# Patient Record
Sex: Male | Born: 1940 | Race: White | Hispanic: No | Marital: Married
Health system: Midwestern US, Community
[De-identification: ages and names within clinical notes are randomized; demographics above are authoritative.]

## PROBLEM LIST (undated history)

## (undated) DIAGNOSIS — M109 Gout, unspecified: Secondary | ICD-10-CM

## (undated) DIAGNOSIS — E119 Type 2 diabetes mellitus without complications: Secondary | ICD-10-CM

## (undated) DIAGNOSIS — F319 Bipolar disorder, unspecified: Secondary | ICD-10-CM

---

## 2018-09-15 ENCOUNTER — Emergency Department
Admission: EM | Admit: 2018-09-15 | Discharge: 2018-09-18 | Disposition: A | Discharge status: Other Acute Inpatient Hospital | Payer: Self-pay | Attending: Emergency Medicine | Admitting: Emergency Medicine

## 2018-09-15 ENCOUNTER — Encounter: Payer: Self-pay | Admitting: Emergency Medicine

## 2018-09-15 ENCOUNTER — Other Ambulatory Visit: Payer: Self-pay

## 2018-09-15 ENCOUNTER — Emergency Department: Payer: Self-pay

## 2018-09-15 DIAGNOSIS — F3110 Bipolar disorder, current episode manic without psychotic features, unspecified: Secondary | ICD-10-CM

## 2018-09-15 DIAGNOSIS — Z046 Encounter for general psychiatric examination, requested by authority: Secondary | ICD-10-CM | POA: Insufficient documentation

## 2018-09-15 DIAGNOSIS — F319 Bipolar disorder, unspecified: Secondary | ICD-10-CM | POA: Insufficient documentation

## 2018-09-15 DIAGNOSIS — M109 Gout, unspecified: Secondary | ICD-10-CM

## 2018-09-15 DIAGNOSIS — E119 Type 2 diabetes mellitus without complications: Secondary | ICD-10-CM | POA: Insufficient documentation

## 2018-09-15 DIAGNOSIS — Z79899 Other long term (current) drug therapy: Secondary | ICD-10-CM | POA: Insufficient documentation

## 2018-09-15 DIAGNOSIS — F312 Bipolar disorder, current episode manic severe with psychotic features: Secondary | ICD-10-CM

## 2018-09-15 HISTORY — DX: Bipolar disorder, unspecified: F31.9

## 2018-09-15 HISTORY — DX: Type 2 diabetes mellitus without complications: E11.9

## 2018-09-15 HISTORY — DX: Gout, unspecified: M10.9

## 2018-09-15 LAB — COMPREHENSIVE METABOLIC PANEL
ALT: 34 U/L (ref 0–44)
AST: 39 U/L (ref 15–41)
Albumin: 3.9 g/dL (ref 3.5–5.0)
Alkaline Phosphatase: 98 U/L (ref 38–126)
Anion gap: 11 (ref 5–15)
BUN: 15 mg/dL (ref 8–23)
CO2: 25 mmol/L (ref 22–32)
Calcium: 9.6 mg/dL (ref 8.9–10.3)
Chloride: 100 mmol/L (ref 98–111)
Creatinine, Ser: 1.19 mg/dL (ref 0.61–1.24)
GFR calc non Af Amer: 59 mL/min — ABNORMAL LOW (ref >60)
Glucose, Bld: 190 mg/dL — ABNORMAL HIGH (ref 70–99)
Potassium: 3.2 mmol/L — ABNORMAL LOW (ref 3.5–5.1)
Sodium: 136 mmol/L (ref 135–145)
TOTAL PROTEIN: 8.1 g/dL (ref 6.5–8.1)
Total Bilirubin: 0.7 mg/dL (ref 0.3–1.2)

## 2018-09-15 LAB — SALICYLATE LEVEL

## 2018-09-15 LAB — CBC
HCT: 42.8 % (ref 39.0–52.0)
Hemoglobin: 14.4 g/dL (ref 13.0–17.0)
MCH: 30.2 pg (ref 26.0–34.0)
MCHC: 33.6 g/dL (ref 30.0–36.0)
MCV: 89.7 fL (ref 80.0–100.0)
Platelets: 272 10*3/uL (ref 150–400)
RBC: 4.77 MIL/uL (ref 4.22–5.81)
RDW: 12.2 % (ref 11.5–15.5)
WBC: 7.1 10*3/uL (ref 4.0–10.5)
nRBC: 0 % (ref 0.0–0.2)

## 2018-09-15 LAB — URINALYSIS, COMPLETE (UACMP) WITH MICROSCOPIC
Bilirubin Urine: NEGATIVE
Glucose, UA: 150 mg/dL — AB
Hgb urine dipstick: NEGATIVE
KETONES UR: NEGATIVE mg/dL
Leukocytes, UA: NEGATIVE
Nitrite: NEGATIVE
Protein, ur: NEGATIVE mg/dL
Specific Gravity, Urine: 1.005 (ref 1.005–1.030)
Squamous Epithelial / HPF: NONE SEEN (ref 0–5)
pH: 6 (ref 5.0–8.0)

## 2018-09-15 LAB — URINE DRUG SCREEN, QUALITATIVE (ARMC ONLY)
Amphetamines, Ur Screen: NOT DETECTED
Barbiturates, Ur Screen: NOT DETECTED
Benzodiazepine, Ur Scrn: NOT DETECTED
Cannabinoid 50 Ng, Ur ~~LOC~~: NOT DETECTED
Cocaine Metabolite,Ur ~~LOC~~: NOT DETECTED
MDMA (Ecstasy)Ur Screen: NOT DETECTED
Methadone Scn, Ur: NOT DETECTED
Opiate, Ur Screen: NOT DETECTED
Phencyclidine (PCP) Ur S: NOT DETECTED
Tricyclic, Ur Screen: NOT DETECTED

## 2018-09-15 LAB — ACETAMINOPHEN LEVEL: Acetaminophen (Tylenol), Serum: <10 ug/mL — ABNORMAL LOW (ref 10–30)

## 2018-09-15 LAB — ETHANOL

## 2018-09-15 MED ORDER — METFORMIN HCL 500 MG PO TABS
500.0000 mg | ORAL_TABLET | Freq: Two times a day (BID) | ORAL | Status: DC
  Administered 2018-09-16 – 2018-09-18 (×4): 500 mg via ORAL
  Filled 2018-09-15 (×5): qty 1

## 2018-09-15 MED ORDER — INDOMETHACIN ER 75 MG PO CPCR
75.0000 mg | ORAL_CAPSULE | ORAL | Status: AC
  Administered 2018-09-15: 75 mg via ORAL
  Filled 2018-09-15: qty 1

## 2018-09-15 MED ORDER — INSULIN ASPART 100 UNIT/ML ~~LOC~~ SOLN
0.0000 [IU] | Freq: Three times a day (TID) | SUBCUTANEOUS | Status: DC
  Administered 2018-09-16: 3 [IU] via SUBCUTANEOUS
  Filled 2018-09-15: qty 1

## 2018-09-15 MED ORDER — NICOTINE 21 MG/24HR TD PT24
21.0000 mg | MEDICATED_PATCH | Freq: Every day | TRANSDERMAL | Status: DC
  Filled 2018-09-15: qty 1

## 2018-09-15 MED ORDER — ATORVASTATIN CALCIUM 20 MG PO TABS
20.0000 mg | ORAL_TABLET | Freq: Every day | ORAL | Status: DC
  Administered 2018-09-15: 20 mg via ORAL
  Filled 2018-09-15: qty 1

## 2018-09-15 MED ORDER — LOSARTAN POTASSIUM 50 MG PO TABS
25.0000 mg | ORAL_TABLET | Freq: Every day | ORAL | Status: DC
Start: 2018-09-15 — End: 2018-09-18
  Administered 2018-09-15 – 2018-09-17 (×3): 25 mg via ORAL
  Filled 2018-09-15 (×3): qty 1

## 2018-09-15 MED ORDER — COLCHICINE 0.6 MG PO TABS
0.6000 mg | ORAL_TABLET | ORAL | Status: AC
  Administered 2018-09-15: 0.6 mg via ORAL
  Filled 2018-09-15: qty 1

## 2018-09-15 MED ORDER — DIVALPROEX SODIUM 250 MG PO DR TAB
250.0000 mg | DELAYED_RELEASE_TABLET | Freq: Two times a day (BID) | ORAL | Status: DC
  Administered 2018-09-15 – 2018-09-17 (×4): 250 mg via ORAL
  Filled 2018-09-15 (×5): qty 1

## 2018-09-15 MED ORDER — AMLODIPINE BESYLATE 5 MG PO TABS
5.0000 mg | ORAL_TABLET | Freq: Every day | ORAL | Status: DC
Start: 2018-09-15 — End: 2018-09-18
  Administered 2018-09-15 – 2018-09-17 (×3): 5 mg via ORAL
  Filled 2018-09-15 (×3): qty 1

## 2018-09-15 MED ORDER — ASPIRIN EC 81 MG PO TBEC
81.0000 mg | DELAYED_RELEASE_TABLET | Freq: Every day | ORAL | Status: DC
  Administered 2018-09-15 – 2018-09-17 (×3): 81 mg via ORAL
  Filled 2018-09-15 (×3): qty 1

## 2018-09-15 MED ORDER — OLANZAPINE 5 MG PO TABS
5.0000 mg | ORAL_TABLET | Freq: Every day | ORAL | Status: DC
  Administered 2018-09-15 – 2018-09-16 (×2): 5 mg via ORAL
  Filled 2018-09-15 (×4): qty 1

## 2018-09-15 NOTE — Consult Note (Signed)
[facility] Face-to-Face Psychiatry Consult   Reason for Consult: Consult for this 77 year old man brought here under involuntary commitment papers from [facility] Referring Physician: Siadecki Patient Identification: Luis Cooper MRN:  [id number] Principal Diagnosis: Bipolar affective disorder, current episode manic (HCC) Diagnosis:  Principal Problem:   Bipolar affective disorder, current episode manic (HCC) Active Problems:   Diabetes mellitus without complication (HCC)   Gout   Total Time spent with patient: 1 hour  Subjective:   Luis Cooper is a 77 y.o. male patient admitted with "it is because of my damn wife".  HPI: Patient seen chart reviewed.  Patient was sent here from [facility] on involuntary commitment papers initiated by his wife.  Paperwork alleges that the patient has bipolar disorder that he has been agitated and angry threatening disorganized in his thinking noncompliant with medical treatment.  Apparently the wife called 911 a couple days ago because she felt that the patient was getting out of control but the ENTs did not feel that he needed to be taken to the hospital.  Today the wife went ahead and took out commitment papers.  Patient was evaluated at [facility] and they also felt he was having a manic episode.  I have limited information at this point.  The patient tells me that he has been admitted to [facility] in the past but we do not have access to any [facility] records.  He knows that he is supposed to be on medicine but we do not have access to any of his pharmacy records yet either.  He does tell me that he has not been taking any of his medicines for days because he "does not need it".  He tells me he has not slept more than 2 or 3 hours in the last week.  Has not been eating well.  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  Pretty disorganized.  He curses when talking about his wife and  says that he has been feeling like he would lose his temper but does not make any specific threats.  Denies suicidal ideation.  Patient says that he does not drink alcohol currently having quit 2 weeks ago denies any other drug use.  He is complaining quite a bit about pain in his ankle from his suppose it gout.  Social history: Apparently lives in [place] with his wife.  He tells me a daughter lives at home as well.  Medical history: The commitment paperwork says that he has diabetes and gout.  He gives me a list of some medicines that include atorvastatin and amlodipine and losartan suggesting that he also has high blood pressure and cholesterol.  Patient says he is having acute pain in his ankle from his gout.  He does not know what his gout medicine is.  Substance abuse history: He says he quit drinking 2 weeks ago.  Denies having had abuse problems before that.  Past Psychiatric History: Limited information because we do not have access to many records.  Patient tells me he has had psychiatric hospitalizations in the past.  Cannot remember how many.  Last one was at [facility] in 2018.  Medicines according to the paperwork are supposed to be Depakote and Zyprexa doses unclear.  Patient denies any history of suicide attempts admits to being aggressive in the past  Risk to Self:   Risk to  Others:   Prior Inpatient Therapy:   Prior Outpatient Therapy:    Past Medical History:  Past Medical History:  Diagnosis Date  . Bipolar 1 disorder (HCC)   . Diabetes mellitus without complication (HCC)   . Gout    History reviewed. No pertinent surgical history. Family History: No family history on file. Family Psychiatric  History: He says everyone in his family has mental health problems but it is hard to tell if he is joking or not.  He cannot come up with any specifics. Social History:  Social History   Substance and Sexual Activity  Alcohol Use Not on file     Social History    Substance and Sexual Activity  Drug Use Not on file    Social History   Socioeconomic History  . Marital status: Not on file    Spouse name: Not on file  . Number of children: Not on file  . Years of education: Not on file  . Highest education level: Not on file  Occupational History  . Not on file  Social Needs  . Financial resource strain: Not on file  . Food insecurity:    Worry: Not on file    Inability: Not on file  . Transportation needs:    Medical: Not on file    Non-medical: Not on file  Tobacco Use  . Smoking status: Unknown If Ever Smoked  Substance and Sexual Activity  . Alcohol use: Not on file  . Drug use: Not on file  . Sexual activity: Not on file  Lifestyle  . Physical activity:    Days per week: Not on file    Minutes per session: Not on file  . Stress: Not on file  Relationships  . Social connections:    Talks on phone: Not on file    Gets together: Not on file    Attends religious service: Not on file    Active member of club or organization: Not on file    Attends meetings of clubs or organizations: Not on file    Relationship status: Not on file  Other Topics Concern  . Not on file  Social History Narrative  . Not on file   Additional Social History:    Allergies:  Allergies not on file  Labs:  Results for orders placed or performed during the hospital encounter of 09/15/18 (from the past 48 hour(s))  Comprehensive metabolic panel     Status: Abnormal   Collection Time: 09/15/18  3:54 PM  Result Value Ref Range   Sodium 136 135 - 145 mmol/L   Potassium 3.2 (L) 3.5 - 5.1 mmol/L   Chloride 100 98 - 111 mmol/L   CO2 25 22 - 32 mmol/L   Glucose, Bld 190 (H) 70 - 99 mg/dL   BUN 15 8 - 23 mg/dL   Creatinine, Ser 1.191.19 0.61 - 1.24 mg/dL   Calcium 9.6 8.9 - 14.710.3 mg/dL   Total Protein 8.1 6.5 - 8.1 g/dL   Albumin 3.9 3.5 - 5.0 g/dL   AST 39 15 - 41 U/L   ALT 34 0 - 44 U/L   Alkaline Phosphatase 98 38 - 126 U/L   Total Bilirubin 0.7  0.3 - 1.2 mg/dL   GFR calc non Af Amer 59 (L) >60 mL/min   GFR calc Af Amer >60 >60 mL/min   Anion gap 11 5 - 15    Comment: Performed at [facility], [address] [place], [place]  [address]  Ethanol     Status: None   Collection Time: 09/15/18  3:54 PM  Result Value Ref Range   Alcohol, Ethyl (B) <10 <10 mg/dL    Comment: (NOTE) Lowest detectable limit for serum alcohol is 10 mg/dL. For medical purposes only. Performed at [facility], [address] [place], [place] [address]   Salicylate level     Status: None   Collection Time: 09/15/18  3:54 PM  Result Value Ref Range   Salicylate Lvl <7.0 2.8 - 30.0 mg/dL    Comment: Performed at [facility], [address] [place], [place] [address]  Acetaminophen level     Status: Abnormal   Collection Time: 09/15/18  3:54 PM  Result Value Ref Range   Acetaminophen (Tylenol), Serum <10 (L) 10 - 30 ug/mL    Comment: (NOTE) Therapeutic concentrations vary significantly. A range of 10-30 ug/mL  may be an effective concentration for many patients. However, some  are best treated at concentrations outside of this range. Acetaminophen concentrations >150 ug/mL at 4 hours after ingestion  and >50 ug/mL at 12 hours after ingestion are often associated with  toxic reactions. Performed at [facility], [address] [place], [place] [address]   cbc     Status: None   Collection Time: 09/15/18  3:54 PM  Result Value Ref Range   WBC 7.1 4.0 - 10.5 K/uL   RBC 4.77 4.22 - 5.81 MIL/uL   Hemoglobin 14.4 13.0 - 17.0 g/dL   HCT 62.9 52.8 - 41.3 %   MCV 89.7 80.0 - 100.0 fL   MCH 30.2 26.0 - 34.0 pg   MCHC 33.6 30.0 - 36.0 g/dL   RDW 24.4 01.0 - 27.2 %   Platelets 272 150 - 400 K/uL   nRBC 0.0 0.0 - 0.2 %    Comment: Performed at [facility], [address] [place], [place] [address]    Current Facility-Administered Medications  Medication Dose Route Frequency Provider Last Rate Last  Dose  . colchicine tablet 0.6 mg  0.6 mg Oral NOW Dereon Williamsen T, MD      . indomethacin (INDOCIN SR) capsule 75 mg  75 mg Oral NOW Lessie Funderburke, Jackquline Denmark, MD       No current outpatient medications on file.    Musculoskeletal: Strength & Muscle Tone: within normal limits Gait & Station: unsteady Patient leans: N/A  Psychiatric Specialty Exam: Physical Exam  Nursing note and vitals reviewed. Constitutional: He appears well-developed and well-nourished.  HENT:  Head: Normocephalic and atraumatic.  Eyes: Pupils are equal, round, and reactive to light. Conjunctivae are normal.  Neck: Normal range of motion.  Cardiovascular: Regular rhythm and normal heart sounds.  Respiratory: Effort normal. No respiratory distress.  GI: Soft.  Musculoskeletal: Normal range of motion.  Neurological: He is alert.  Skin: Skin is warm and dry.  Psychiatric: His affect is labile. His speech is tangential. He is agitated. He is not aggressive. Thought content is paranoid. Cognition and memory are impaired. He expresses inappropriate judgment. He expresses no homicidal and no suicidal ideation.    Review of Systems  Constitutional: Negative.   HENT: Negative.   Eyes: Negative.   Respiratory: Negative.   Cardiovascular: Negative.   Gastrointestinal: Negative.   Musculoskeletal: Positive for joint pain.  Skin: Negative.   Neurological: Negative.   Psychiatric/Behavioral: Positive for memory loss. Negative for depression, hallucinations, substance abuse and suicidal ideas. The patient has insomnia. The patient is not nervous/anxious.  There were no vitals taken for this visit.There is no height or weight on file to calculate BMI.  General Appearance: Disheveled  Eye Contact:  Good  Speech:  Pressured  Volume:  Increased  Mood:  Angry, Anxious and Irritable  Affect:  Labile  Thought Process:  Disorganized  Orientation:  Full (Time, Place, and Person)  Thought Content:  Illogical, Delusions and  Paranoid Ideation  Suicidal Thoughts:  No  Homicidal Thoughts:  No  Memory:  Immediate;   Fair Recent;   Fair Remote;   Fair  Judgement:  Impaired  Insight:  Shallow  Psychomotor Activity:  Normal  Concentration:  Concentration: Fair  Recall:  [employer] of Knowledge:  Fair  Language:  Fair  Akathisia:  No  Handed:  Right  AIMS (if indicated):     Assets:  Housing Social Support  ADL's:  Impaired  Cognition:  Impaired,  Mild  Sleep:        Treatment Plan Summary: Daily contact with patient to assess and evaluate symptoms and progress in treatment, Medication management and Plan 77 year old man who is described by referral sources as having mania.  On presentation today it is consistent with that.  He is hyperverbal disorganized in his thinking grandiose.  A little agitated but has not been violent or threatening to me although it is apparent that he was cursing when he first came into the emergency room.  Probably has been noncompliant with medicine.  Further details unclear.  Patient has multiple medical problems but so far his labs are pretty unremarkable.  I am going to order one-time doses of indomethacin and colchicine for his gout.  I will order his Depakote and Zyprexa at what seem like reasonable doses.  We will have to keep an eye on his blood sugar.  I am not clear if he takes insulin or just uses oral medicine.  I would call his wife for information but we do not have that information in the chart yet.  Overall I think he needs hospitalization and I would recommend geriatric psychiatry as being the first place to look.  Case reviewed with TTS and emergency room doctor.  Disposition: Recommend psychiatric Inpatient admission when medically cleared. Supportive therapy provided about ongoing stressors.  Mordecai Rasmussen, MD 09/15/2018 5:36 PM

## 2018-09-15 NOTE — ED Notes (Signed)
Patient assigned to appropriate care area   Introduced self to pt  Patient oriented to unit/care area: Informed that, for their safety, care areas are designed for safety and visiting and phone hours explained to patient. Patient verbalizes understanding, and verbal contract for safety obtained Environment secured  Pt talking loudly   PT with BPD , pt has been IVC by family due to bipolar and not taking meds or going to appointments and + AVH. PT very ill speaking in triage to this RN , pt appears very angry. Pt refuses to answers questions .

## 2018-09-15 NOTE — ED Provider Notes (Signed)
[facility] Emergency Department Provider Note ____________________________________________   First MD Initiated Contact with Patient 09/15/18 1630     (approximate)  I have reviewed the triage vital signs and the nursing notes.   HISTORY  Chief Complaint No chief complaint on file.    HPI Luis Cooper is a 77 y.o. male with PMH as noted below who presents for psychiatric evaluation after he was placed under involuntary commitment.  Per the commitment paperwork, the patient has been noncompliant with medications and appointments, agitated, and speaking about "something big that is going to happen."  At this time the patient denies any acute complaints except for pain in bilateral feet related to his gout.  He denies SI or HI.   Past Medical History:  Diagnosis Date  . Bipolar 1 disorder (HCC)   . Diabetes mellitus without complication (HCC)   . Gout     Patient Active Problem List   Diagnosis Date Noted  . Bipolar affective disorder, current episode manic (HCC) 09/15/2018  . Diabetes mellitus without complication (HCC) 09/15/2018  . Gout 09/15/2018    History reviewed. No pertinent surgical history.  Prior to Admission medications   Medication Sig Start Date End Date Taking? Authorizing Provider  atorvastatin (LIPITOR) 40 MG tablet TAKE 1 TABLET BY MOUTH ONCE DAILY FOR CHOLESTEROL 06/18/18  Yes [provider]  divalproex (DEPAKOTE) 500 MG DR tablet TAKE 1 TABLET BY MOUTH TWICE DAILY (1 IN THE MORNING AND 1 AT BEDTIME) 08/29/18  Yes [provider]    Allergies Patient has no allergy information on record.  No family history on file.  Social History Social History   Tobacco Use  . Smoking status: Unknown If Ever Smoked  Substance Use Topics  . Alcohol use: Not on file  . Drug use: Not on file    Review of Systems  Constitutional: No fever. Eyes: No visual changes. ENT: No sore throat. Cardiovascular: Denies  chest pain. Respiratory: Denies shortness of breath. Gastrointestinal: No vomiting or diarrhea.  Genitourinary: Negative for flank pain.  Musculoskeletal: Positive for foot pain. Skin: Negative for rash. Neurological: Negative for headache.   ____________________________________________   PHYSICAL EXAM:  VITAL SIGNS: ED Triage Vitals  Enc Vitals Group     BP      Pulse      Resp      Temp      Temp src      SpO2      Weight      Height      Head Circumference      Peak Flow      Pain Score      Pain Loc      Pain Edu?      Excl. in GC?     Constitutional: Alert and oriented.  Relatively well appearing and in no acute distress. Eyes: Conjunctivae are normal.  Head: Atraumatic. Nose: No congestion/rhinnorhea. Mouth/Throat: Mucous membranes are moist.   Neck: Normal range of motion.  Cardiovascular: Good peripheral circulation. Respiratory: Normal respiratory effort. Gastrointestinal: No distention.  Musculoskeletal:  Extremities warm and well perfused.  Neurologic:  Normal speech and language. No gross focal neurologic deficits are appreciated.  Skin:  Skin is warm and dry. No rash noted. Psychiatric: Agitated and somewhat labile.  ____________________________________________   LABS (all labs ordered are listed, but only abnormal results are displayed)  Labs Reviewed  COMPREHENSIVE METABOLIC PANEL - Abnormal; Notable for the following components:      Result  Value   Potassium 3.2 (*)    Glucose, Bld 190 (*)    GFR calc non Af Amer 59 (*)    All other components within normal limits  ACETAMINOPHEN LEVEL - Abnormal; Notable for the following components:   Acetaminophen (Tylenol), Serum <10 (*)    All other components within normal limits  URINALYSIS, COMPLETE (UACMP) WITH MICROSCOPIC - Abnormal; Notable for the following components:   Color, Urine STRAW (*)    APPearance CLEAR (*)    Glucose, UA 150 (*)    Bacteria, UA RARE (*)    All other components  within normal limits  ETHANOL  SALICYLATE LEVEL  CBC  URINE DRUG SCREEN, QUALITATIVE ([facility] ONLY)  HEMOGLOBIN A1C   ____________________________________________  EKG   ____________________________________________  RADIOLOGY  CXR: Patient declines  ____________________________________________   PROCEDURES  Procedure(s) performed: No  Procedures  Critical Care performed: No ____________________________________________   INITIAL IMPRESSION / ASSESSMENT AND PLAN / ED COURSE  Pertinent labs & imaging results that were available during my care of the patient were reviewed by me and considered in my medical decision making (see chart for details).  77 year old male with history of bipolar disorder presents for psychiatric evaluation, under involuntary commitment taken out by his family.  Per the commitment paperwork, the patient is noncompliant with medication, increasingly agitated, and talking about "something like that is going to happen."  There is concern for danger to self and/or others.  On exam, the vital signs are normal except for mild tachycardia, likely due to the patient's agitation.  The patient is comfortable appearing.  He is agitated and labile, and appears very confrontational when I attempt to obtain history from him although I was eventually able to verbally redirect him and get some information.  He is aware that he is under commitment and was sent here for mental health evaluation.  He denies any acute mental health complaints except he states he just wants to go to sleep.  He reports chronic pain to both feet from his gout but no acute medical symptoms.  We will order lab work-up for medical clearance, and TTS/behavioral health evaluation.  ----------------------------------------- 11:06 PM on 09/15/2018 -----------------------------------------  The lab work-up is unremarkable.  The patient is medically cleared.  The patient was evaluated by Dr.  Toni Amend, who recommended placement in a geriatric psychiatric unit.  TTS contacted several possible places.  Per TTS, one of the potential facilities requested some additional work-up including EKG and chest x-ray.  The patient initially declined some of this additional work-up and declined a temperature, but after speaking to him about why they were important he agreed.  However the patient is adamant that he does not want a chest x-ray.  He states that he had one done not that long ago, and also feels that it should only be done when he arrives at the facility if it is necessary.  I attempted multiple times to explain to the patient why it would be helpful in his work-up.  However I agree that it is not strictly medically indicated at this time as the patient has no respiratory symptoms and no history of chronic pulmonary disease that requires a chest x-ray at this time.  His lungs are clear, his O2 saturation is in the high 90s on room air, and he is comfortable appearing.  Although the patient is under commitment and in does not have decision-making capacity to refuse care, given that this test is not medically indicated at this  time it would not be appropriate to give the patient sedation or anxiolysis to do this test, or obtain it against his will.  I am signing the patient out at this time to the oncoming physician, with disposition pending placement by the behavioral team.  ____________________________________________   FINAL CLINICAL IMPRESSION(S) / ED DIAGNOSES  Final diagnoses:  Bipolar affective disorder, remission status unspecified (HCC)      NEW MEDICATIONS STARTED DURING THIS VISIT:  New Prescriptions   No medications on file     Note:  This document was prepared using Dragon voice recognition software and may include unintentional dictation errors.    Dionne Bucy, MD 09/15/18 2314

## 2018-09-15 NOTE — ED Notes (Signed)
Patient talking to the MD

## 2018-09-15 NOTE — ED Notes (Addendum)
Dr. Toni Amendlapacs at bedside.   Maintained on 15 minute checks and observation by security for safety.

## 2018-09-15 NOTE — ED Notes (Signed)
Radiology came by for chest x-ray.  Pt. Refusing x-ray, pt. Began to get upset.  This nurse was able to calm patient down and give night-time medication.  Pt. Calm and laying in bed with feet elevated.

## 2018-09-15 NOTE — BH Assessment (Signed)
Referral information for Psychiatric Hospitalization faxed to;   [name]. [facility] [phone]),   . [place]  . [facility]  . [place] [phone]),  . [facility] [phone], [phone] or [phone]),   . [place] [phone] or [phone]  . [facility] [phone]),    . [facility] [phone]),   . [address] [place] [phone]),   . [facility] [phone] or [phone])  . [place] [phone] or [phone]),

## 2018-09-15 NOTE — ED Notes (Signed)
Pt is irritable and demanding.  Refused chest X-ray.  Allowed VS except for temperature.  Pt refusing to walk to the bathroom due to gout pain.  Pt given urinal.  Compliant with most medications (refused metformin and nicotine patch).   Maintained on 15 minute checks and observation by security for safety.

## 2018-09-15 NOTE — ED Triage Notes (Addendum)
PT with BPD , pt has been IVC by family due to bipolar and not taking meds or going to appointments and + AVH. PT very ill speaking in triage to this RN , pt appears very angry. Pt refuses to answers questions .

## 2018-09-15 NOTE — BH Assessment (Signed)
Assessment Note  Luis Cooper is an 77 y.o. male.   Pt refused TTS assessment. Pt was not clear or logical in his thought patterns. He repeatedly asked if he could smoke a cigarette.  Per Psych Note: Recommend psychiatric Inpatient admission when medically cleared.   Diagnosis: Bipolar 1 Disorder, by history  Past Medical History:  Past Medical History:  Diagnosis Date  . Bipolar 1 disorder (HCC)   . Diabetes mellitus without complication (HCC)   . Gout     History reviewed. No pertinent surgical history.  Family History: No family history on file.  Social History:  has no tobacco, alcohol, and drug history on file.  Additional Social History:     CIWA: CIWA-Ar BP: 127/73 Pulse Rate: (!) 109 COWS:    Allergies: Allergies not on file  Home Medications:  (Not in a hospital admission)  OB/GYN Status:  No LMP for male patient.  General Assessment Data Assessment unable to be completed: Yes Reason for not completing assessment: Pt refusing to engage in assessment. Admission Status: Involuntary                    Mental Status Report Motor Activity: Freedom of movement                            Advance Directives (For Healthcare) Does Patient Have a Medical Advance Directive?: No Would patient like information on creating a medical advance directive?: No - Patient declined          Disposition:     On Site Evaluation by:   Reviewed with Physician:    Wilmon ArmsSTEVENSON, Kenedi Cilia 09/15/2018 6:33 PM

## 2018-09-15 NOTE — ED Notes (Signed)
Police officers showed up and wanted to talk to patient about ongoing case, officers introduced to patient, patient talking to officers.

## 2018-09-15 NOTE — ED Notes (Signed)
Pt. Laying in bed watching tv.  Pt. Has no complaints or questions at this time.

## 2018-09-16 LAB — HEMOGLOBIN A1C
Hgb A1c MFr Bld: 9.7 % — ABNORMAL HIGH (ref 4.8–5.6)
Mean Plasma Glucose: 231.69 mg/dL

## 2018-09-16 LAB — GLUCOSE, CAPILLARY
GLUCOSE-CAPILLARY: 238 mg/dL — AB (ref 70–99)
Glucose-Capillary: 214 mg/dL — ABNORMAL HIGH (ref 70–99)

## 2018-09-16 MED ORDER — OLANZAPINE 5 MG PO TABS: 5.0000 mg | ORAL_TABLET | Freq: Every day | ORAL | Status: DC

## 2018-09-16 NOTE — BH Assessment (Signed)
Patient has been accepted to [facility].  [facility]. Accepting physician is Dr. Danton SewerHalimena Creque.  Call report to [phone].  Representative was Alene.   ER Staff is aware of it:  Ronnie, ER Secretary  Dr. Scotty CourtStafford, ER MD  Amy T., Patient's Nurse

## 2018-09-16 NOTE — ED Notes (Signed)
Pt given meal tray at this time but refused drink.

## 2018-09-16 NOTE — ED Notes (Addendum)
Pt awake sitting on the side of the bed  - introduced myself to him  Attempted to discuss his plan of care including a pending CXR   Pt adamantly refusing   - pt irate stating  "call [place] - I am going to have the federal government sue ya'll - move me to another room then you can evaluate me - no one unless they are [facility] is going to touch me  - go get me a decanter of coffee  RIGHT NOW!  Pt pointing his finger in my face and yelling   "I am fine - I just need some sleep  - go get my coffee"    Asked if cbg could be checked and he allowed    No coffee provided due to risk of pt using hot liquid as a weapon

## 2018-09-16 NOTE — ED Notes (Signed)
IVC, geropsych placement

## 2018-09-16 NOTE — ED Notes (Signed)
Pt given meal tray and a diet sprite.

## 2018-09-16 NOTE — ED Notes (Signed)

## 2018-09-16 NOTE — ED Notes (Signed)
Pt refusing his insulin for bs of 238

## 2018-09-16 NOTE — ED Notes (Signed)
ED  Is the patient under IVC or is there intent for IVC: Yes.   Is the patient medically cleared: Yes.   Is there vacancy in the ED BHU: Yes.   Is the population mix appropriate for patient:  No geriatric   Is the patient awaiting placement in inpatient or outpatient setting:  Yes geripsych placement   Has the patient had a psychiatric consult: Yes.   Survey of unit performed for contraband, proper placement and condition of furniture, tampering with fixtures in bathroom, shower, and each patient room: Yes.  ; Findings:  APPEARANCE/BEHAVIOR Anxious  Agitated but cooperative NEURO ASSESSMENT Orientation: oriented x3  Denies pain Hallucinations: No.None noted (Hallucinations) denies  Speech: Normal Gait: normal RESPIRATORY ASSESSMENT Even  Unlabored respirations  CARDIOVASCULAR ASSESSMENT Pulses equal   regular rate  Skin warm and dry   GASTROINTESTINAL ASSESSMENT no GI complaint EXTREMITIES Full ROM  PLAN OF CARE Provide calm/safe environment. Vital signs assessed twice daily. ED BHU Assessment once each 12-hour shift. Collaborate with TTS daily or as condition indicates. Assure the ED provider has rounded once each shift. Provide and encourage hygiene. Provide redirection as needed. Assess for escalating behavior; address immediately and inform ED provider.  Assess family dynamic and appropriateness for visitation as needed: Yes.  ; If necessary, describe findings:  Educate the patient/family about BHU procedures/visitation: Yes.  ; If necessary, describe findings:

## 2018-09-16 NOTE — ED Notes (Signed)
BEHAVIORAL HEALTH ROUNDING Patient sleeping: No. Patient alert and oriented: yes Behavior appropriate: Yes.  ; If no, describe:  Nutrition and fluids offered: yes Toileting and hygiene offered: Yes  Sitter present: q15 minute observations and security  monitoring Law enforcement present: Yes  ODS  

## 2018-09-16 NOTE — ED Notes (Signed)
Pt ambulatory to bathroom with no distress noted. 

## 2018-09-16 NOTE — ED Provider Notes (Signed)
 -----------------------------------------   7:36 PM on 09/16/2018 -----------------------------------------  Patient has been accepted to [facility] for ongoing psychiatric care.  Remains calm, cooperative with medications, remains medically stable and suitable for transfer for ongoing psychiatric care.  He refuses to take sliding scale insulin since this is not been a previous medication for him that and he is unfamiliar with it.  With his acute decompensation of psychiatric illness, I feel that this is not the time to try and convince him to allow new medications, particularly ones that involve injection and discomfort, I will continue metformin and monitor blood sugars for now.   Sharman CheekStafford, Tanganika Barradas, MD 09/16/18 [phone]

## 2018-09-16 NOTE — ED Notes (Signed)
BEHAVIORAL HEALTH ROUNDING Patient sleeping: Yes.   Patient alert and oriented: eyes closed  Appears asleep Behavior appropriate: Yes.  ; If no, describe:  Nutrition and fluids offered: Yes  Toileting and hygiene offered: sleeping Sitter present: q 15 minute observations and security monitoring Law enforcement present: yes  ODS 

## 2018-09-16 NOTE — BH Assessment (Signed)
This Clinical research associatewriter re-faxed pt's referral information to geriatric psych facilities.  Spoke directly with Luis Cooper - who is requesting a copy of pt's IVC papers and most recent psych consult.  Information faxed to [phone]

## 2018-09-16 NOTE — ED Notes (Signed)
Pt observed with no unusual behavior   No verbalized needs or concerns at this time  NAD assessed   Radiology at bedside - encouraging pt to go for CXR  Continue to monitor
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
## 2018-09-16 NOTE — ED Notes (Signed)
Pt refused FS.

## 2018-09-16 NOTE — ED Provider Notes (Signed)
-----------------------------------------   7:37 AM on 09/16/2018 -----------------------------------------   Blood pressure 127/73, pulse (!) 109, temperature 97.6 F (36.4 C), temperature source Oral, resp. rate 18, SpO2 97 %.  The patient had no acute events since last update.  Calm and cooperative at this time.  Disposition is pending Psychiatry/Behavioral Medicine team recommendations.     Arnaldo NatalMalinda, Paul F, MD 09/16/18 [phone]

## 2018-09-17 NOTE — ED Notes (Signed)
Pt. Listening to music station on tv.  Pt. Appears to be in a good mood.  Pt. Remembers this nurse from last night.  Pt. Continues to talk about the government, about contacting them and giving them information.  Pt. Is calm and cooperative at this time and denies SI or HI at this time.

## 2018-09-17 NOTE — ED Notes (Signed)
BEHAVIORAL HEALTH ROUNDING Patient sleeping: No. Patient alert and oriented: yes Behavior appropriate: Yes.  ; If no, describe:  Nutrition and fluids offered: yes Toileting and hygiene offered: Yes  Sitter present: q15 minute observations and security  monitoring Law enforcement present: Yes  ODS  

## 2018-09-17 NOTE — ED Notes (Signed)
BEHAVIORAL HEALTH ROUNDING Patient sleeping: Yes.   Patient alert and oriented: eyes closed  Appears to be asleep Behavior appropriate: Yes.  ; If no, describe:  Nutrition and fluids offered: Yes  Toileting and hygiene offered: sleeping Sitter present: q 15 minute observations and security monitoring Law enforcement present: yes  ODS 

## 2018-09-17 NOTE — ED Notes (Signed)
Attempted to administer metformin x2 and pt states  "not right now - maybe tonight"

## 2018-09-17 NOTE — ED Notes (Signed)
ED  Is the patient under IVC or is there intent for IVC: Yes.   Is the patient medically cleared: Yes.   Is there vacancy in the ED BHU: Yes.   Is the population mix appropriate for patient:  geriatric  Is the patient awaiting placement in inpatient or outpatient setting: accepted [facility] Has the patient had a psychiatric consult: Yes.   Survey of unit performed for contraband, proper placement and condition of furniture, tampering with fixtures in bathroom, shower, and each patient room: Yes.  ; Findings:  APPEARANCE/BEHAVIOR  cooperative  Demanding  NEURO ASSESSMENT Orientation: oriented x3   Hallucinations: No.None noted (Hallucinations) denies Speech: Normal Gait: normal RESPIRATORY ASSESSMENT Even  Unlabored respirations  CARDIOVASCULAR ASSESSMENT Pulses equal   regular rate  Skin warm and dry   GASTROINTESTINAL ASSESSMENT no GI complaint EXTREMITIES Full ROM  PLAN OF CARE Provide calm/safe environment. Vital signs assessed twice daily. ED BHU Assessment once each 12-hour shift. Collaborate with TTS  daily or as condition indicates. Assure the ED provider has rounded once each shift. Provide and encourage hygiene. Provide redirection as needed. Assess for escalating behavior; address immediately and inform ED provider.  Assess family dynamic and appropriateness for visitation as needed: Yes.  ; If necessary, describe findings:  Educate the patient/family about BHU procedures/visitation: Yes.  ; If necessary, describe findings:

## 2018-09-17 NOTE — ED Notes (Signed)
Am meds administered as ordered  - assessment completed  He has been accepted to [facility] for inpatient admission  [facility]- [facility] does not have any transporters to day so pt will leave tomorrow am   continue to monitor

## 2018-09-17 NOTE — BH Assessment (Signed)
TTS spoke with [facility] Te[facility] Dunk(Lister, RN) to inform that transportation isn't available until Monday (09/18/18) morning. They confirmed they will hold pt's bed.

## 2018-09-17 NOTE — ED Provider Notes (Signed)
Vitals:   09/15/18 1857 09/16/18 0855  BP:  123/69  Pulse:  92  Resp:  18  Temp: 97.6 F (36.4 C) 97.8 F (36.6 C)  SpO2:  98%   No acute events reported to me overnight by physician or nursing report.  I reviewed TTS disposition, patient is accepted to [facility] in transfer, however there is apparently no transportation available until Monday.     Luis Cooper, Luis Harmening, MD 09/17/18 [phone]

## 2018-09-17 NOTE — ED Notes (Signed)
He has refused all CBG's today  Continue to monitor

## 2018-09-17 NOTE — ED Notes (Signed)
Pt observed lying in bed   Pt visualized with NAD  No verbalized needs or concerns at this time  Continue to monitor 

## 2018-09-17 NOTE — ED Notes (Signed)

## 2018-09-18 LAB — GLUCOSE, CAPILLARY: GLUCOSE-CAPILLARY: 158 mg/dL — AB (ref 70–99)

## 2018-09-18 NOTE — ED Notes (Signed)
Gave pt breakfast tray with juice. 

## 2018-09-18 NOTE — ED Notes (Signed)
EMTALA reviewed by charge RN 

## 2018-09-18 NOTE — ED Provider Notes (Signed)
-----------------------------------------   6:27 AM on 09/18/2018 -----------------------------------------   Blood pressure (!) 118/54, pulse 89, temperature 98 F (36.7 C), resp. rate 17, SpO2 97 %.  The patient had no acute events since last update.  Calm and cooperative at this time.  Disposition is pending Psychiatry/Behavioral Medicine team recommendations.  She should be transported to [facility] this morning.    Irean HongSung, Jaanvi Fizer J, MD 09/18/18 [phone]

## 2018-09-18 NOTE — ED Notes (Signed)
Pt telling this writer,"I don't trust no damn psychiatrist. If they are gonna do it, it has to be audio-visual. You tell them that."   A couple minutes later patient told this RN to send exactly what he had stated in a email to his wife.  Patient pointing his finger at nurse while speaking.   Maintained on 15 minute checks and observation by security for safety.

## 2018-09-18 NOTE — ED Notes (Signed)
Called [facility] for transport [phone] 

## 2018-09-18 NOTE — ED Notes (Signed)
Pt discharged under IVC to [facility]. VS stable. Report called to Summer, Charity fundraiserN.  All belongings sent with officers.  Pt cooperative with transfer.

## 2018-09-18 NOTE — ED Provider Notes (Addendum)
-----------------------------------------   7:29 AM on 09/18/2018 -----------------------------------------  Patient has been accepted to [facility].  We will transport once transportation becomes available.  Medical work-up is largely nonrevealing besides an elevated hemoglobin A1c.   Minna AntisPaduchowski, Hugo Lybrand, MD 09/18/18 0730  Patient seen by myself, discussed pending transfer.  Patient has no questions.  He does request that he is videotaped during any further questioning.   Minna AntisPaduchowski, Casey Maxfield, MD 09/18/18 [phone]

## 2018-09-18 NOTE — ED Notes (Signed)
Pt's ride has arrived for pick up.

## 2018-10-11 ENCOUNTER — Emergency Department
Admission: EM | Admit: 2018-10-11 | Discharge: 2018-10-12 | Disposition: A | Discharge status: Other Acute Inpatient Hospital | Payer: Medicare Other | Attending: Emergency Medicine | Admitting: Emergency Medicine

## 2018-10-11 DIAGNOSIS — F312 Bipolar disorder, current episode manic severe with psychotic features: Secondary | ICD-10-CM | POA: Diagnosis not present

## 2018-10-11 DIAGNOSIS — Z79899 Other long term (current) drug therapy: Secondary | ICD-10-CM | POA: Diagnosis not present

## 2018-10-11 DIAGNOSIS — Z7984 Long term (current) use of oral hypoglycemic drugs: Secondary | ICD-10-CM | POA: Diagnosis not present

## 2018-10-11 DIAGNOSIS — E119 Type 2 diabetes mellitus without complications: Secondary | ICD-10-CM | POA: Diagnosis not present

## 2018-10-11 DIAGNOSIS — R4689 Other symptoms and signs involving appearance and behavior: Secondary | ICD-10-CM

## 2018-10-11 DIAGNOSIS — F99 Mental disorder, not otherwise specified: Secondary | ICD-10-CM | POA: Diagnosis present

## 2018-10-11 LAB — COMPREHENSIVE METABOLIC PANEL
ALBUMIN: 4 g/dL (ref 3.5–5.0)
ALT: 32 U/L (ref 0–44)
AST: 34 U/L (ref 15–41)
Alkaline Phosphatase: 96 U/L (ref 38–126)
Anion gap: 9 (ref 5–15)
BILIRUBIN TOTAL: 0.5 mg/dL (ref 0.3–1.2)
BUN: 25 mg/dL — AB (ref 8–23)
CO2: 24 mmol/L (ref 22–32)
Calcium: 9.8 mg/dL (ref 8.9–10.3)
Chloride: 104 mmol/L (ref 98–111)
Creatinine, Ser: 1.28 mg/dL — ABNORMAL HIGH (ref 0.61–1.24)
GFR calc Af Amer: >60 mL/min (ref >60)
GFR, EST NON AFRICAN AMERICAN: 54 mL/min — AB (ref >60)
Glucose, Bld: 216 mg/dL — ABNORMAL HIGH (ref 70–99)
Potassium: 3.7 mmol/L (ref 3.5–5.1)
Sodium: 137 mmol/L (ref 135–145)
Total Protein: 7.5 g/dL (ref 6.5–8.1)

## 2018-10-11 LAB — URINALYSIS, COMPLETE (UACMP) WITH MICROSCOPIC
BACTERIA UA: NONE SEEN
Bilirubin Urine: NEGATIVE
Glucose, UA: NEGATIVE mg/dL
Hgb urine dipstick: NEGATIVE
KETONES UR: NEGATIVE mg/dL
Leukocytes, UA: NEGATIVE
Nitrite: NEGATIVE
Protein, ur: NEGATIVE mg/dL
Specific Gravity, Urine: 1.01 (ref 1.005–1.030)
pH: 6 (ref 5.0–8.0)

## 2018-10-11 LAB — CBC
HCT: 41.5 % (ref 39.0–52.0)
Hemoglobin: 13.7 g/dL (ref 13.0–17.0)
MCH: 29.8 pg (ref 26.0–34.0)
MCHC: 33 g/dL (ref 30.0–36.0)
MCV: 90.4 fL (ref 80.0–100.0)
Platelets: 247 10*3/uL (ref 150–400)
RBC: 4.59 MIL/uL (ref 4.22–5.81)
RDW: 13.4 % (ref 11.5–15.5)
WBC: 7.4 10*3/uL (ref 4.0–10.5)
nRBC: 0 % (ref 0.0–0.2)

## 2018-10-11 LAB — VALPROIC ACID LEVEL: Valproic Acid Lvl: <10 ug/mL — ABNORMAL LOW (ref 50.0–100.0)

## 2018-10-11 LAB — URINE DRUG SCREEN, QUALITATIVE (ARMC ONLY)
Amphetamines, Ur Screen: NOT DETECTED
Barbiturates, Ur Screen: NOT DETECTED
Benzodiazepine, Ur Scrn: NOT DETECTED
CANNABINOID 50 NG, UR ~~LOC~~: NOT DETECTED
Cocaine Metabolite,Ur ~~LOC~~: NOT DETECTED
MDMA (Ecstasy)Ur Screen: NOT DETECTED
Methadone Scn, Ur: NOT DETECTED
Opiate, Ur Screen: NOT DETECTED
PHENCYCLIDINE (PCP) UR S: NOT DETECTED
Tricyclic, Ur Screen: NOT DETECTED

## 2018-10-11 LAB — ACETAMINOPHEN LEVEL: Acetaminophen (Tylenol), Serum: <10 ug/mL — ABNORMAL LOW (ref 10–30)

## 2018-10-11 LAB — ETHANOL: Alcohol, Ethyl (B): <10 mg/dL (ref <10)

## 2018-10-11 LAB — SALICYLATE LEVEL: Salicylate Lvl: <7 mg/dL (ref 2.8–30.0)

## 2018-10-11 MED ORDER — LORAZEPAM 2 MG/ML IJ SOLN
INTRAMUSCULAR | Status: AC
  Administered 2018-10-11: 2 mg via INTRAMUSCULAR
  Filled 2018-10-11: qty 1

## 2018-10-11 MED ORDER — LORAZEPAM 2 MG/ML IJ SOLN
2.0000 mg | Freq: Once | INTRAMUSCULAR | Status: AC
  Administered 2018-10-11: 2 mg via INTRAMUSCULAR

## 2018-10-11 NOTE — ED Notes (Signed)
Report given to [facility] Dr Jilda Panda. Camera in room.
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
## 2018-10-11 NOTE — ED Notes (Signed)
Pt finished consult with SOC walked out of the room stating he wants his cigarettes. Officer Wilson informed pt that campus is tobacco free. Pt then came at the officer and he was escorted back to his room. Dr Marisa Severin informed and verbal order for ativan 2 mg obtained.

## 2018-10-11 NOTE — ED Notes (Signed)
Pt unresponsive to verbal prompts. Per nursing report pt has been recently medicated due to agitation.  Pt was very somnolent at this time. Unable to complete a thorough face to face assessment due to current mental status. SOC has recommended inpatient treatment.   Referral information for Psychiatric Hospitalization faxed to;   [name] [phone]),   . [facility] ([phone]),  . [facility] [phone] or [phone])  . [facility] [phone], [phone], [phone] or [phone]),   . [facility] [phone]),   . [name] [phone]),   . [facility] [phone] or [phone])  . [place] [phone] or [phone]),   . [facility] [phone]).

## 2018-10-11 NOTE — ED Notes (Signed)
Pt came into hallway towards RN.  "Where is that damn doctor?   I have business to do and aint got time for this shit. I'm gonna sue this hospital."    Patient walked back to his room.   Maintained on 15 minute checks and observation by security camera for safety.

## 2018-10-11 NOTE — ED Notes (Signed)
PT IVC/SOC COMPLETE/PT PENDING PLACEMENT. 

## 2018-10-11 NOTE — ED Notes (Signed)
Pt dressed out into appropriate behavioral health clothing with this tech, Thanh,EDT and [facility] from [facility] in the rm. Pt belongings consist of a blue pair of shoes, blue jeans, a set of keys, a green jacket, two tan shirts, a black pen, a black belt, a brown wallet, white socks and a brown watch.  Pt has twenty two $100 dollar bills, three $20 dollar bills, four $1 dollar bills, three quarters, one dime, two nickels and five pennies that was handed to [employer] to lock up in the safe. Money was counted by this tech and [employer] in front of pt, [place], [place] and Amy, Charity fundraiser. Total of money locked up is $2265.00.
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
## 2018-10-12 MED ORDER — HALOPERIDOL 5 MG PO TABS
5.0000 mg | ORAL_TABLET | Freq: Once | ORAL | Status: AC
  Administered 2018-10-12: 5 mg via ORAL
  Filled 2018-10-12: qty 1

## 2018-10-12 NOTE — ED Notes (Signed)
Sheriff  [employer]  For  Transport  To  [facility]

## 2018-10-12 NOTE — ED Notes (Addendum)
Spoke with Luis Maw, RN [facility] again regarding pts money. Pt will be sent with money in [facility] envelope to [facility]. Money was taken out of security and checked by this RN and Consulting civil engineer, package was sealed when pt left [facility].

## 2018-10-12 NOTE — ED Notes (Signed)
EMTALA reviewed. 

## 2018-10-12 NOTE — ED Notes (Signed)
Pt awake and yelling out. Saying he wants to go home. Dr Dolores Frame gave new orders.

## 2018-10-12 NOTE — ED Provider Notes (Signed)
-----------------------------------------   6:47 AM on 10/12/2018 -----------------------------------------   Blood pressure 96/71, pulse 95, temperature 97.8 F (36.6 C), temperature source Oral, resp. rate 15, height 5\' 2"  (1.575 m), weight 66.2 kg, SpO2 97 %.  Patient required oral calming agent around 4:30 AM.  He was yelling and getting agitated.  Calm and cooperative at this time.  Disposition is pending Psychiatry/Behavioral Medicine team recommendations.     Irean Hong, MD 10/12/18 [phone]

## 2018-10-12 NOTE — BH Assessment (Signed)
Patient has been accepted to [facility].  Patient assigned to [facility]. Accepting physician is Dr. Forrestine Him.  Call report to [phone].  Representative was [employer].   ER Staff is aware of it:  Misty Stanley, ER Secretary  Dr. Roxan Hockey, ER MD  Herbert Seta, Patient's Nurse

## 2018-10-12 NOTE — ED Notes (Signed)
Attempted to call report to [facility], no answer 

## 2018-11-02 ENCOUNTER — Emergency Department
Admission: EM | Admit: 2018-11-02 | Discharge: 2018-11-03 | Disposition: A | Discharge status: Home / Self Care | Payer: Medicare Other | Attending: Emergency Medicine | Admitting: Emergency Medicine

## 2018-11-02 ENCOUNTER — Encounter: Payer: Self-pay | Admitting: Emergency Medicine

## 2018-11-02 DIAGNOSIS — R4689 Other symptoms and signs involving appearance and behavior: Secondary | ICD-10-CM

## 2018-11-02 DIAGNOSIS — Z046 Encounter for general psychiatric examination, requested by authority: Secondary | ICD-10-CM | POA: Diagnosis not present

## 2018-11-02 DIAGNOSIS — E119 Type 2 diabetes mellitus without complications: Secondary | ICD-10-CM | POA: Diagnosis not present

## 2018-11-02 DIAGNOSIS — F4320 Adjustment disorder, unspecified: Secondary | ICD-10-CM | POA: Diagnosis not present

## 2018-11-02 DIAGNOSIS — Z79899 Other long term (current) drug therapy: Secondary | ICD-10-CM | POA: Diagnosis not present

## 2018-11-02 DIAGNOSIS — M109 Gout, unspecified: Secondary | ICD-10-CM | POA: Diagnosis present

## 2018-11-02 DIAGNOSIS — Z7984 Long term (current) use of oral hypoglycemic drugs: Secondary | ICD-10-CM | POA: Diagnosis not present

## 2018-11-02 DIAGNOSIS — F319 Bipolar disorder, unspecified: Secondary | ICD-10-CM

## 2018-11-02 LAB — COMPREHENSIVE METABOLIC PANEL
ALT: 38 U/L (ref 0–44)
AST: 32 U/L (ref 15–41)
Albumin: 3.6 g/dL (ref 3.5–5.0)
Alkaline Phosphatase: 112 U/L (ref 38–126)
Anion gap: 9 (ref 5–15)
BILIRUBIN TOTAL: 0.5 mg/dL (ref 0.3–1.2)
BUN: 21 mg/dL (ref 8–23)
CO2: 24 mmol/L (ref 22–32)
CREATININE: 1.23 mg/dL (ref 0.61–1.24)
Calcium: 9.8 mg/dL (ref 8.9–10.3)
Chloride: 103 mmol/L (ref 98–111)
GFR calc Af Amer: >60 mL/min (ref >60)
GFR calc non Af Amer: 57 mL/min — ABNORMAL LOW (ref >60)
Glucose, Bld: 294 mg/dL — ABNORMAL HIGH (ref 70–99)
Potassium: 4.1 mmol/L (ref 3.5–5.1)
Sodium: 136 mmol/L (ref 135–145)
Total Protein: 8.1 g/dL (ref 6.5–8.1)

## 2018-11-02 LAB — URINE DRUG SCREEN, QUALITATIVE (ARMC ONLY)
Amphetamines, Ur Screen: NOT DETECTED
Barbiturates, Ur Screen: NOT DETECTED
Benzodiazepine, Ur Scrn: NOT DETECTED
Cannabinoid 50 Ng, Ur ~~LOC~~: NOT DETECTED
Cocaine Metabolite,Ur ~~LOC~~: NOT DETECTED
MDMA (Ecstasy)Ur Screen: NOT DETECTED
Methadone Scn, Ur: NOT DETECTED
OPIATE, UR SCREEN: NOT DETECTED
PHENCYCLIDINE (PCP) UR S: NOT DETECTED
Tricyclic, Ur Screen: NOT DETECTED

## 2018-11-02 LAB — CBC
HCT: 42.6 % (ref 39.0–52.0)
Hemoglobin: 14.3 g/dL (ref 13.0–17.0)
MCH: 29.4 pg (ref 26.0–34.0)
MCHC: 33.6 g/dL (ref 30.0–36.0)
MCV: 87.7 fL (ref 80.0–100.0)
Platelets: 281 10*3/uL (ref 150–400)
RBC: 4.86 MIL/uL (ref 4.22–5.81)
RDW: 13.2 % (ref 11.5–15.5)
WBC: 4.7 10*3/uL (ref 4.0–10.5)
nRBC: 0 % (ref 0.0–0.2)

## 2018-11-02 LAB — SALICYLATE LEVEL: Salicylate Lvl: <7 mg/dL (ref 2.8–30.0)

## 2018-11-02 LAB — ACETAMINOPHEN LEVEL: Acetaminophen (Tylenol), Serum: <10 ug/mL — ABNORMAL LOW (ref 10–30)

## 2018-11-02 LAB — ETHANOL: Alcohol, Ethyl (B): <10 mg/dL (ref <10)

## 2018-11-02 MED ORDER — AMLODIPINE BESYLATE 5 MG PO TABS
10.0000 mg | ORAL_TABLET | Freq: Every day | ORAL | Status: DC
  Administered 2018-11-02: 10 mg via ORAL
  Filled 2018-11-02: qty 2

## 2018-11-02 MED ORDER — DIVALPROEX SODIUM 250 MG PO DR TAB
250.0000 mg | DELAYED_RELEASE_TABLET | Freq: Two times a day (BID) | ORAL | Status: DC
  Administered 2018-11-02: 250 mg via ORAL
  Filled 2018-11-02: qty 1

## 2018-11-02 MED ORDER — METFORMIN HCL 500 MG PO TABS: 1000.0000 mg | ORAL_TABLET | Freq: Two times a day (BID) | ORAL | Status: DC

## 2018-11-02 MED ORDER — ATORVASTATIN CALCIUM 20 MG PO TABS: 20.0000 mg | ORAL_TABLET | Freq: Every day | ORAL | Status: DC

## 2018-11-02 NOTE — ED Notes (Signed)
IVC/Consult ordered/Pending 

## 2018-11-02 NOTE — ED Notes (Signed)
Hourly rounding reveals patient in room. No complaints, stable, in no acute distress. Q15 minute rounds and monitoring via Rover and Officer to continue.   

## 2018-11-02 NOTE — ED Provider Notes (Signed)
[facility] Emergency Department Provider Note  ____________________________________________   First MD Initiated Contact with Patient 11/02/18 1554     (approximate)  I have reviewed the triage vital signs and the nursing notes.   HISTORY  Chief Complaint Mental Health Problem   HPI Luis Cooper is a 78 y.o. male with a history of bipolar disorder as well as diabetes and gout who is presenting to the emergency department today under involuntary commitment from jail.  Patient brought to be evaluated in the emergency department secondary to aggressive behavior and history of psychiatric admission.  Ordered by the judge to be evaluated here in the emergency department.  Patient states that he was "abuse" at jail because he was not allowed to make a phone call over the past 6 days.  Denies any suicidal or homicidal ideation.   Past Medical History:  Diagnosis Date  . Bipolar 1 disorder (HCC)   . Diabetes mellitus without complication (HCC)   . Gout     Patient Active Problem List   Diagnosis Date Noted  . Bipolar affective disorder, current episode manic (HCC) 09/15/2018  . Diabetes mellitus without complication (HCC) 09/15/2018  . Gout 09/15/2018    History reviewed. No pertinent surgical history.  Prior to Admission medications   Medication Sig Start Date End Date Taking? Authorizing Provider  amLODipine (NORVASC) 10 MG tablet Take 10 mg by mouth daily.    [provider]  atorvastatin (LIPITOR) 20 MG tablet Take 20 mg by mouth daily at 6 PM.  06/18/18   [provider]  divalproex (DEPAKOTE) 250 MG DR tablet Take 250 mg by mouth every 12 (twelve) hours.  08/29/18   [provider]  losartan (COZAAR) 25 MG tablet Take 25 mg by mouth.     [provider]  metFORMIN (GLUCOPHAGE) 500 MG tablet Take 1,000 mg by mouth 2 (two) times daily with a meal.    [provider]    Allergies Patient has no known  allergies.  No family history on file.  Social History Social History   Tobacco Use  . Smoking status: Unknown If Ever Smoked  Substance Use Topics  . Alcohol use: Not on file  . Drug use: Not on file    Review of Systems  Constitutional: No fever/chills Eyes: No visual changes. ENT: No sore throat. Cardiovascular: Denies chest pain. Respiratory: Denies shortness of breath. Gastrointestinal: No abdominal pain.  No nausea, no vomiting.  No diarrhea.  No constipation. Genitourinary: Negative for dysuria. Musculoskeletal: Negative for back pain. Skin: Negative for rash. Neurological: Negative for headaches, focal weakness or numbness.   ____________________________________________   PHYSICAL EXAM:  VITAL SIGNS: ED Triage Vitals  Enc Vitals Group     BP 11/02/18 1520 140/83     Pulse Rate 11/02/18 1520 96     Resp 11/02/18 1520 16     Temp 11/02/18 1520 97.8 F (36.6 C)     Temp Source 11/02/18 1520 Oral     SpO2 11/02/18 1520 96 %     Weight 11/02/18 1516 140 lb (63.5 kg)     Height 11/02/18 1516 5\' 2"  (1.575 m)     Head Circumference --      Peak Flow --      Pain Score 11/02/18 1516 0     Pain Loc --      Pain Edu? --      Excl. in GC? --     Constitutional: Alert and oriented.  Well appearing and in no acute distress. Eyes: Conjunctivae are normal.  Head: Atraumatic. Nose: No congestion/rhinnorhea. Mouth/Throat: Mucous membranes are moist.  Neck: No stridor.   Cardiovascular: Normal rate, regular rhythm. Grossly normal heart sounds.   Respiratory: Normal respiratory effort.  No retractions. Lungs CTAB. Gastrointestinal: Soft and nontender. No distention.  Musculoskeletal: No lower extremity tenderness nor edema.  No joint effusions. Neurologic:  Normal speech and language. No gross focal neurologic deficits are appreciated. Skin:  Skin is warm, dry and intact. No rash noted. Psychiatric: Mood and affect are normal. Speech and behavior are  normal.  ____________________________________________   LABS (all labs ordered are listed, but only abnormal results are displayed)  Labs Reviewed  COMPREHENSIVE METABOLIC PANEL - Abnormal; Notable for the following components:      Result Value   Glucose, Bld 294 (*)    GFR calc non Af Amer 57 (*)    All other components within normal limits  ACETAMINOPHEN LEVEL - Abnormal; Notable for the following components:   Acetaminophen (Tylenol), Serum <10 (*)    All other components within normal limits  ETHANOL  SALICYLATE LEVEL  CBC  URINE DRUG SCREEN, QUALITATIVE ([facility] ONLY)   ____________________________________________  EKG   ____________________________________________  RADIOLOGY   ____________________________________________   PROCEDURES  Procedure(s) performed:   Procedures  Critical Care performed:   ____________________________________________   INITIAL IMPRESSION / ASSESSMENT AND PLAN / ED COURSE  Pertinent labs & imaging results that were available during my care of the patient were reviewed by me and considered in my medical decision making (see chart for details).  DDX: Aggressive behavior, bipolar disorder, mania, polysubstance abuse As part of my medical decision making, I reviewed the following data within the electronic MEDICAL RECORD NUMBER Notes from prior ED visits  ----------------------------------------- 4:44 PM on 11/02/2018 -----------------------------------------  I will uphold the patient's involuntary commitment and have him evaluated by psychiatry.  He is calm and cooperative at this time.  However, does have a psychiatric history and admits to being aggressive in jail.  Patient understanding of the commitment status as well as the need to be discussed further with psychiatry. ____________________________________________   FINAL CLINICAL IMPRESSION(S) / ED DIAGNOSES  Aggressive behavior  NEW MEDICATIONS STARTED DURING THIS  VISIT:  New Prescriptions   No medications on file     Note:  This document was prepared using Dragon voice recognition software and may include unintentional dictation errors.     Myrna Blazer, MD 11/02/18 [phone]

## 2018-11-02 NOTE — ED Notes (Addendum)
Pt dressed out by this [employer] and officer Morales. Belongings include: dark grey shirt, black sweatpants, black underwear, orange sandals. Dark green jacket, lighter.

## 2018-11-02 NOTE — ED Notes (Signed)

## 2018-11-02 NOTE — ED Triage Notes (Signed)
Pt in via [facility] under IVC from the jail house. Pt denies SI.

## 2018-11-02 NOTE — Consult Note (Signed)
[facility] Consult   Reason for Consult: Consult for this 78 year old man with a history of bipolar disorder who was brought here directly from jail Referring Physician: Pershing ProudSchaevitz Patient Identification: Luis Cooper MRN:  [id number] Principal Diagnosis: Adjustment disorder Diagnosis:  Principal Problem:   Adjustment disorder Active Problems:   Diabetes mellitus without complication (HCC)   Gout   Bipolar 1 disorder (HCC)   Total Time spent with patient: 1 hour  Subjective:   Luis Cooper is a 78 y.o. male patient admitted with "I have violated that restraining order".  HPI: Patient seen chart reviewed.  This is a 78 year old man with a history of bipolar disorder.  He has been seen a couple times previously over the last few months with mania.  Patient tells me that a few weeks ago he violated the restraining order that his wife had taken out against him by going by her house to visit her.  Denies that he was intending to or trying to do anything to harm her.  Nevertheless she called the police and the patient was taken to jail where he has been for about the last week.  According to the commitment paperwork the patient had been violent with staff and uncooperative at the jail.  Patient himself denies having been violent.  He says he mostly was just resting at jail.  Says they did not give him any medicine while he was there.  Currently he denies any specific psychiatric symptoms.  Denies being angry denies being depressed.  Patient presents as calm and appropriate actually quite different from how he has seemed on some of his previous visits.  Denies suicidal or homicidal thoughts.  Does not evidence any delusions or paranoia.  Social history: Evidently wife has taken out a restraining order against the patient.  He had been staying in a motel room that he had been sharing until he got arrested.  He says his plan now would be to go sleep in his car until he can manage  to get enough money to get a room again.  Medical history: Diabetes and gout  Substance abuse history: Denies any history of significant alcohol or drug abuse nothing acute or recent in the chart.  Past Psychiatric History: Patient has a history of bipolar disorder and has been at our emergency room with manic-like symptoms at least a couple times over the last few months.  Was sent to [facility] twice in a row over this.  Patient says that the medication and treatment at [facility] did help but that he has not been on any medicine since being in jail.  Risk to Self:   Risk to Others:   Prior Inpatient Therapy:   Prior Outpatient Therapy:    Past Medical History:  Past Medical History:  Diagnosis Date  . Bipolar 1 disorder (HCC)   . Diabetes mellitus without complication (HCC)   . Gout    History reviewed. No pertinent surgical history. Family History: No family history on file. Family Psychiatric  History: None known Social History:  Social History   Substance and Sexual Activity  Alcohol Use Not on file     Social History   Substance and Sexual Activity  Drug Use Not on file    Social History   Socioeconomic History  . Marital status: Married    Spouse name: Not on file  . Number of children: Not on file  . Years of education: Not on file  . Highest education level:  Not on file  Occupational History  . Not on file  Social Needs  . Financial resource strain: Not on file  . Food insecurity:    Worry: Not on file    Inability: Not on file  . Transportation needs:    Medical: Not on file    Non-medical: Not on file  Tobacco Use  . Smoking status: Unknown If Ever Smoked  Substance and Sexual Activity  . Alcohol use: Not on file  . Drug use: Not on file  . Sexual activity: Not on file  Lifestyle  . Physical activity:    Days per week: Not on file    Minutes per session: Not on file  . Stress: Not on file  Relationships  . Social connections:    Talks on  phone: Not on file    Gets together: Not on file    Attends religious service: Not on file    Active member of club or organization: Not on file    Attends meetings of clubs or organizations: Not on file    Relationship status: Not on file  Other Topics Concern  . Not on file  Social History Narrative  . Not on file   Additional Social History:    Allergies:  No Known Allergies  Labs:  Results for orders placed or performed during the hospital encounter of 11/02/18 (from the past 48 hour(s))  Comprehensive metabolic panel     Status: Abnormal   Collection Time: 11/02/18  3:32 PM  Result Value Ref Range   Sodium 136 135 - 145 mmol/L   Potassium 4.1 3.5 - 5.1 mmol/L   Chloride 103 98 - 111 mmol/L   CO2 24 22 - 32 mmol/L   Glucose, Bld 294 (H) 70 - 99 mg/dL   BUN 21 8 - 23 mg/dL   Creatinine, Ser 0.981.23 0.61 - 1.24 mg/dL   Calcium 9.8 8.9 - 11.910.3 mg/dL   Total Protein 8.1 6.5 - 8.1 g/dL   Albumin 3.6 3.5 - 5.0 g/dL   AST 32 15 - 41 U/L   ALT 38 0 - 44 U/L   Alkaline Phosphatase 112 38 - 126 U/L   Total Bilirubin 0.5 0.3 - 1.2 mg/dL   GFR calc non Af Amer 57 (L) >60 mL/min   GFR calc Af Amer >60 >60 mL/min   Anion gap 9 5 - 15    Comment: Performed at [facility], [address] [place], [place] [address]  Ethanol     Status: None   Collection Time: 11/02/18  3:32 PM  Result Value Ref Range   Alcohol, Ethyl (B) <10 <10 mg/dL    Comment: (NOTE) Lowest detectable limit for serum alcohol is 10 mg/dL. For medical purposes only. Performed at [facility], [address] [place], [place] [address]   Salicylate level     Status: None   Collection Time: 11/02/18  3:32 PM  Result Value Ref Range   Salicylate Lvl <7.0 2.8 - 30.0 mg/dL    Comment: Performed at [facility], [address] [place], [place] [address]  Acetaminophen level     Status: Abnormal   Collection Time: 11/02/18  3:32 PM  Result Value Ref Range   Acetaminophen (Tylenol),  Serum <10 (L) 10 - 30 ug/mL    Comment: (NOTE) Therapeutic concentrations vary significantly. A range of 10-30 ug/mL  may be an effective concentration for many patients. However, some  are best treated at concentrations outside of  this range. Acetaminophen concentrations >150 ug/mL at 4 hours after ingestion  and >50 ug/mL at 12 hours after ingestion are often associated with  toxic reactions. Performed at [facility], [address] [place], [place] [address]   cbc     Status: None   Collection Time: 11/02/18  3:32 PM  Result Value Ref Range   WBC 4.7 4.0 - 10.5 K/uL   RBC 4.86 4.22 - 5.81 MIL/uL   Hemoglobin 14.3 13.0 - 17.0 g/dL   HCT 62.8 31.5 - 17.6 %   MCV 87.7 80.0 - 100.0 fL   MCH 29.4 26.0 - 34.0 pg   MCHC 33.6 30.0 - 36.0 g/dL   RDW 16.0 73.7 - 10.6 %   Platelets 281 150 - 400 K/uL   nRBC 0.0 0.0 - 0.2 %    Comment: Performed at [facility], [address], [place], [place] [address]  Urine Drug Screen, Qualitative     Status: None   Collection Time: 11/02/18  3:32 PM  Result Value Ref Range   Tricyclic, Ur Screen NONE DETECTED NONE DETECTED   Amphetamines, Ur Screen NONE DETECTED NONE DETECTED   MDMA (Ecstasy)Ur Screen NONE DETECTED NONE DETECTED   Cocaine Metabolite,Ur [place] NONE DETECTED NONE DETECTED   Opiate, Ur Screen NONE DETECTED NONE DETECTED   Phencyclidine (PCP) Ur S NONE DETECTED NONE DETECTED   Cannabinoid 50 Ng, Ur [place] NONE DETECTED NONE DETECTED   Barbiturates, Ur Screen NONE DETECTED NONE DETECTED   Benzodiazepine, Ur Scrn NONE DETECTED NONE DETECTED   Methadone Scn, Ur NONE DETECTED NONE DETECTED    Comment: (NOTE) Tricyclics + metabolites, urine    Cutoff 1000 ng/mL Amphetamines + metabolites, urine  Cutoff 1000 ng/mL MDMA (Ecstasy), urine              Cutoff 500 ng/mL Cocaine Metabolite, urine          Cutoff 300 ng/mL Opiate + metabolites, urine        Cutoff 300 ng/mL Phencyclidine (PCP), urine         Cutoff 25  ng/mL Cannabinoid, urine                 Cutoff 50 ng/mL Barbiturates + metabolites, urine  Cutoff 200 ng/mL Benzodiazepine, urine              Cutoff 200 ng/mL Methadone, urine                   Cutoff 300 ng/mL The urine drug screen provides only a preliminary, unconfirmed analytical test result and should not be used for non-medical purposes. Clinical consideration and professional judgment should be applied to any positive drug screen result due to possible interfering substances. A more specific alternate chemical method must be used in order to obtain a confirmed analytical result. Gas chromatography / mass spectrometry (GC/MS) is the preferred confirmat ory method. Performed at [facility], [address], [place], [place] [address]     Current Facility-Administered Medications  Medication Dose Route Frequency Provider Last Rate Last Dose  . amLODipine (NORVASC) tablet 10 mg  10 mg Oral Daily Boleslaus Holloway T, MD      . Melene Muller ON 11/03/2018] atorvastatin (LIPITOR) tablet 20 mg  20 mg Oral q1800 Gillian Meeuwsen T, MD      . divalproex (DEPAKOTE) DR tablet 250 mg  250 mg Oral Q12H Nicki Gracy, Jackquline Denmark, MD      . Melene Muller ON 11/03/2018] metFORMIN (GLUCOPHAGE) tablet 1,000  mg  1,000 mg Oral BID WC Rhetta Cleek, Jackquline Denmark, MD       Current Outpatient Medications  Medication Sig Dispense Refill  . amLODipine (NORVASC) 10 MG tablet Take 10 mg by mouth daily.    [name] atorvastatin (LIPITOR) 20 MG tablet Take 20 mg by mouth daily at 6 PM.   3  . divalproex (DEPAKOTE) 250 MG DR tablet Take 250 mg by mouth every 12 (twelve) hours.   3  . losartan (COZAAR) 25 MG tablet Take 25 mg by mouth.     . metFORMIN (GLUCOPHAGE) 500 MG tablet Take 1,000 mg by mouth 2 (two) times daily with a meal.      Musculoskeletal: Strength & Muscle Tone: within normal limits Gait & Station: normal Patient leans: N/A  Psychiatric Specialty Exam: Physical Exam  Nursing note and vitals reviewed. Constitutional: He  appears well-developed and well-nourished.  HENT:  Head: Normocephalic and atraumatic.  Eyes: Pupils are equal, round, and reactive to light. Conjunctivae are normal.  Neck: Normal range of motion.  Cardiovascular: Regular rhythm and normal heart sounds.  Respiratory: Effort normal. No respiratory distress.  GI: Soft.  Musculoskeletal: Normal range of motion.  Neurological: He is alert.  Skin: Skin is warm and dry.  Psychiatric: He has a normal mood and affect. His speech is normal and behavior is normal. Judgment and thought content normal. Cognition and memory are normal.    Review of Systems  Constitutional: Negative.   HENT: Negative.   Eyes: Negative.   Respiratory: Negative.   Cardiovascular: Negative.   Gastrointestinal: Negative.   Musculoskeletal: Negative.   Skin: Negative.   Neurological: Negative.   Psychiatric/Behavioral: Negative.     Blood pressure (!) 153/93, pulse (!) 102, temperature 97.6 F (36.4 C), temperature source Axillary, resp. rate 16, height 5\' 2"  (1.575 m), weight 65.8 kg, SpO2 100 %.Body mass index is 26.52 kg/m.  General Appearance: Casual  Eye Contact:  Fair  Speech:  Clear and Coherent  Volume:  Normal  Mood:  Euthymic  Affect:  Congruent  Thought Process:  Goal Directed  Orientation:  Full (Time, Place, and Person)  Thought Content:  Logical  Suicidal Thoughts:  No  Homicidal Thoughts:  No  Memory:  Immediate;   Fair Recent;   Fair Remote;   Fair  Judgement:  Fair  Insight:  Fair  Psychomotor Activity:  Normal  Concentration:  Concentration: Fair  Recall:  [employer] of Knowledge:  Fair  Language:  Fair  Akathisia:  No  Handed:  Right  AIMS (if indicated):     Assets:  Desire for Improvement Resilience  ADL's:  Intact  Cognition:  Impaired,  Mild  Sleep:        Treatment Plan Summary: Daily contact with patient to assess and evaluate symptoms and progress in treatment, Medication management and Plan Patient at this point  does not appear to be acutely dangerous to himself or others.  No evidence of suicidality or psychosis.  Understands the 50 B and agrees that he is not going to go back by his wife's house.  Patient says his plan is to sleep in his car briefly and then maybe try and get a room again.  He agrees to being compliant with medicine.  Overall I do not think he meets commitment criteria and does not need further hospitalization.  I put in orders for all of his usual medicines.  I have discontinued the IVC.  Case reviewed with emergency room doctor.  Recommend that he be allowed to sleep overnight and in the morning social work can be contacted if needed and the patient can be discharged appropriately.  Disposition: No evidence of imminent risk to self or others at present.   Patient does not meet criteria for psychiatric inpatient admission.  Mordecai Rasmussen, MD 11/02/2018 8:26 PM

## 2018-11-02 NOTE — ED Notes (Signed)
Pt given meal tray and a cup of coffee.

## 2018-11-02 NOTE — ED Notes (Signed)
Report to include Situation, Background, Assessment, and Recommendations received from Amy RN. Patient alert and oriented, warm and dry, in no acute distress. Patient denied SI, HI, AVH and pain. Patient looked fatigue. He was able to answer some questions but he falls back to sleep. Patient made aware of Q15 minute rounds and Psychologist, counselling presence for their safety. Patient instructed to come to me with needs or concerns.

## 2018-11-02 NOTE — ED Notes (Signed)
BEHAVIORAL HEALTH ROUNDING Patient sleeping: No. Patient alert and oriented: yes Behavior appropriate: Yes.  ; If no, describe:  Nutrition and fluids offered: yes Toileting and hygiene offered: Yes  Sitter present: q15 minute observations and security  monitoring Law enforcement present: Yes  ODS  

## 2018-11-03 NOTE — ED Notes (Signed)
Hourly rounding reveals patient in room. No complaints, stable, in no acute distress. Q15 minute rounds and monitoring via Rover and Officer to continue.   

## 2018-11-03 NOTE — Discharge Instructions (Signed)
Results for orders placed or performed during the hospital encounter of 11/02/18  Comprehensive metabolic panel  Result Value Ref Range   Sodium 136 135 - 145 mmol/L   Potassium 4.1 3.5 - 5.1 mmol/L   Chloride 103 98 - 111 mmol/L   CO2 24 22 - 32 mmol/L   Glucose, Bld 294 (H) 70 - 99 mg/dL   BUN 21 8 - 23 mg/dL   Creatinine, Ser 7.51 0.61 - 1.24 mg/dL   Calcium 9.8 8.9 - 70.0 mg/dL   Total Protein 8.1 6.5 - 8.1 g/dL   Albumin 3.6 3.5 - 5.0 g/dL   AST 32 15 - 41 U/L   ALT 38 0 - 44 U/L   Alkaline Phosphatase 112 38 - 126 U/L   Total Bilirubin 0.5 0.3 - 1.2 mg/dL   GFR calc non Af Amer 57 (L) >60 mL/min   GFR calc Af Amer >60 >60 mL/min   Anion gap 9 5 - 15  Ethanol  Result Value Ref Range   Alcohol, Ethyl (B) <10 <10 mg/dL  Salicylate level  Result Value Ref Range   Salicylate Lvl <7.0 2.8 - 30.0 mg/dL  Acetaminophen level  Result Value Ref Range   Acetaminophen (Tylenol), Serum <10 (L) 10 - 30 ug/mL  cbc  Result Value Ref Range   WBC 4.7 4.0 - 10.5 K/uL   RBC 4.86 4.22 - 5.81 MIL/uL   Hemoglobin 14.3 13.0 - 17.0 g/dL   HCT 17.4 94.4 - 96.7 %   MCV 87.7 80.0 - 100.0 fL   MCH 29.4 26.0 - 34.0 pg   MCHC 33.6 30.0 - 36.0 g/dL   RDW 59.1 63.8 - 46.6 %   Platelets 281 150 - 400 K/uL   nRBC 0.0 0.0 - 0.2 %  Urine Drug Screen, Qualitative  Result Value Ref Range   Tricyclic, Ur Screen NONE DETECTED NONE DETECTED   Amphetamines, Ur Screen NONE DETECTED NONE DETECTED   MDMA (Ecstasy)Ur Screen NONE DETECTED NONE DETECTED   Cocaine Metabolite,Ur [place] NONE DETECTED NONE DETECTED   Opiate, Ur Screen NONE DETECTED NONE DETECTED   Phencyclidine (PCP) Ur S NONE DETECTED NONE DETECTED   Cannabinoid 50 Ng, Ur [place] NONE DETECTED NONE DETECTED   Barbiturates, Ur Screen NONE DETECTED NONE DETECTED   Benzodiazepine, Ur Scrn NONE DETECTED NONE DETECTED   Methadone Scn, Ur NONE DETECTED NONE DETECTED

## 2018-11-03 NOTE — ED Notes (Signed)
BEHAVIORAL HEALTH ROUNDING Patient sleeping: No. Patient alert and oriented: yes Behavior appropriate: Yes.  ; If no, describe:  Nutrition and fluids offered: yes Toileting and hygiene offered: Yes  Sitter present: q15 minute observations and security  monitoring Law enforcement present: Yes  ODS  

## 2018-11-03 NOTE — Clinical Social Work Note (Signed)
Clinical Social Work Assessment  Patient Details  Name: Luis Cooper MRN: [id number] Date of Birth: [birth year]  Date of referral:  11/03/18               Reason for consult:  [facility] sought to share information with:   NO Permission granted to share information::  No  Name::        Agency::     Relationship::     Contact Information:     Housing/Transportation Living arrangements for the past 2 months:  Single Family Home Source of Information:  Patient Patient Interpreter Needed:  None Criminal Activity/Legal Involvement Pertinent to Current Situation/Hospitalization:  Yes([employer] taking out on him from wife- spent time in jail 6 days- Understands not to go to the house) Significant Relationships:  None, Neighbor Lives with:  Self Do you feel safe going back to the place where you live?  No Need for family participation in patient care:  No (Coment)  Care giving concerns:  None- I take care of myself ( all ADLs completed by himself alert and oriented x4)   Social Worker assessment / plan: LCSW interviewed patient he is alert and oriented x4. He reports he is aware he is not to return to his home as his wife has a protection order which includes his daughter. He reports he is going to get his car- located in the parking lot beside the [address] on [address]. He will then look into staying with his friend Erlene Quan. He reports he has a monthly income of 800.00 and will stay with his friend or [facility] a [facility]. Patient was guarded with his information. He reports he is not suicidal/No AH/VH no feelings of despair or hopelessness. LCSW will provide this patient with boarding house and shelter information and resource [facility]   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), Ship broker) PT Recommendations:  Not assessed at this  time Information / Referral to community resources:  Shelter  Patient/Family's Response to care:  None no contact granted  Patient/Family's Understanding of and Emotional Response to Diagnosis, Current Treatment, and Prognosis:    Emotional Assessment Appearance:  Appears stated age Attitude/Demeanor/Rapport:  Engaged, Self-Confident Affect (typically observed):  Accepting, Adaptable Orientation:  Oriented to Self, Oriented to Place, Oriented to Situation, Oriented to  Time Alcohol / Substance use:  Tobacco Use Psych involvement (Current and /or in the community):  No (Comment)  Discharge Needs  Concerns to be addressed:  No discharge needs identified Readmission within the last 30 days:  No Current discharge risk:    None Barriers to Discharge:  No Barriers Identified   Cheron Schaumann, LCSW 11/03/2018, 9:50 AM

## 2018-11-03 NOTE — ED Notes (Signed)
Iced decaf coffee provided  Pt lying in bed  Reports  "I want some breakfast and a shower and then I will be ready to leave."  Pt with discharge orders in place  Continue to monitor

## 2018-11-03 NOTE — ED Provider Notes (Signed)
-----------------------------------------   4:17 AM on 11/03/2018 -----------------------------------------   Blood pressure (!) 153/93, pulse (!) 102, temperature 97.6 F (36.4 C), temperature source Axillary, resp. rate 16, height 5\' 2"  (1.575 m), weight 65.8 kg, SpO2 100 %.  The patient is calm and cooperative at this time.  There have been no acute events since the last update.  SOC recommends outpatient management.  Will dc in AM.   Merrily Brittleifenbark, Yury Schaus, MD 11/03/18 [phone]

## 2018-11-03 NOTE — ED Notes (Signed)
He is awake  - standing in the doorway waving his cup at me stating  "I need coffee"    Pt informed after report I will get him some coffee   NAD observed  Continue to monitor

## 2018-11-03 NOTE — ED Notes (Signed)

## 2018-11-03 NOTE — ED Notes (Signed)
ED Is the patient under IVC or is there intent for IVC: No  - rescinded   Is the patient medically cleared: Yes.   Is there vacancy in the ED BHU: Yes.   Is the population mix appropriate for patient: Yes.   Is the patient awaiting placement in inpatient or outpatient setting: pt to be discharged   Has the patient had a psychiatric consult: Yes.   Survey of unit performed for contraband, proper placement and condition of furniture, tampering with fixtures in bathroom, shower, and each patient room: Yes.  ; Findings:  APPEARANCE/BEHAVIOR Calm and cooperative NEURO ASSESSMENT Orientation: oriented x3  Denies pain Hallucinations: No.None noted (Hallucinations)  Denies  Speech: Normal Gait: normal RESPIRATORY ASSESSMENT Even  Unlabored respirations  CARDIOVASCULAR ASSESSMENT Pulses equal   regular rate  Skin warm and dry   GASTROINTESTINAL ASSESSMENT no GI complaint EXTREMITIES Full ROM  PLAN OF CARE Provide calm/safe environment. Vital signs assessed twice daily. ED BHU Assessment once each 12-hour shift. Collaborate with TTS daily or as condition indicates. Assure the ED provider has rounded once each shift. Provide and encourage hygiene. Provide redirection as needed. Assess for escalating behavior; address immediately and inform ED provider.  Assess family dynamic and appropriateness for visitation as needed: Yes.  ; If necessary, describe findings:  Educate the patient/family about BHU procedures/visitation: Yes.  ; If necessary, describe findings:

## 2018-11-03 NOTE — ED Notes (Signed)
esignature stopped working as pt was signing

## 2018-11-03 NOTE — ED Notes (Signed)
He is currently taking a shower  

## 2018-11-06 ENCOUNTER — Emergency Department
Admission: EM | Admit: 2018-11-06 | Discharge: 2018-11-06 | Discharge status: Against Medical Advice | Payer: Medicare Other

## 2019-04-01 ENCOUNTER — Emergency Department: Admit: 2019-04-01 | Payer: MEDICARE | Primary: Student in an Organized Health Care Education/Training Program

## 2019-04-01 ENCOUNTER — Inpatient Hospital Stay
Admit: 2019-04-01 | Discharge: 2019-04-02 | Disposition: A | Discharge status: Other Acute Inpatient Hospital | Payer: MEDICARE | Attending: Emergency Medicine

## 2019-04-01 DIAGNOSIS — R45851 Suicidal ideations: Secondary | ICD-10-CM

## 2019-04-01 LAB — CBC WITH AUTOMATED DIFF
ABS. BASOPHILS: 0.1 10*3/uL (ref 0.0–0.1)
ABS. EOSINOPHILS: 0.1 10*3/uL (ref 0.0–0.4)
ABS. IMM. GRANS.: 0 10*3/uL (ref 0.00–0.04)
ABS. LYMPHOCYTES: 1.3 10*3/uL (ref 0.8–3.5)
ABS. MONOCYTES: 0.6 10*3/uL (ref 0.0–1.0)
ABS. NEUTROPHILS: 5.4 10*3/uL (ref 1.8–8.0)
ABSOLUTE NRBC: 0 10*3/uL (ref 0.00–0.01)
BASOPHILS: 1 % (ref 0–1)
EOSINOPHILS: 2 % (ref 0–7)
HCT: 41.9 % (ref 36.6–50.3)
HGB: 14.9 g/dL (ref 12.1–17.0)
IMMATURE GRANULOCYTES: 0 % (ref 0.0–0.5)
LYMPHOCYTES: 17 % (ref 12–49)
MCH: 31.2 PG (ref 26.0–34.0)
MCHC: 35.6 g/dL (ref 30.0–36.5)
MCV: 87.7 FL (ref 80.0–99.0)
MONOCYTES: 8 % (ref 5–13)
MPV: 10.1 FL (ref 8.9–12.9)
NEUTROPHILS: 72 % (ref 32–75)
NRBC: 0 PER 100 WBC
PLATELET: 212 10*3/uL (ref 150–400)
RBC: 4.78 M/uL (ref 4.10–5.70)
RDW: 13.4 % (ref 11.5–14.5)
WBC: 7.5 10*3/uL (ref 4.1–11.1)

## 2019-04-01 LAB — METABOLIC PANEL, COMPREHENSIVE
A-G Ratio: 0.9 — ABNORMAL LOW (ref 1.1–2.2)
ALT (SGPT): 49 U/L (ref 12–78)
AST (SGOT): 35 U/L (ref 15–37)
Albumin: 3.8 g/dL (ref 3.5–5.0)
Alk. phosphatase: 119 U/L — ABNORMAL HIGH (ref 45–117)
Anion gap: 6 mmol/L (ref 5–15)
BUN/Creatinine ratio: 12 (ref 12–20)
BUN: 15 MG/DL (ref 6–20)
Bilirubin, total: 0.5 MG/DL (ref 0.2–1.0)
CO2: 24 mmol/L (ref 21–32)
Calcium: 9.2 MG/DL (ref 8.5–10.1)
Chloride: 108 mmol/L (ref 97–108)
Creatinine: 1.22 MG/DL (ref 0.70–1.30)
GFR est AA: >60 mL/min/{1.73_m2} (ref >60)
GFR est non-AA: 58 mL/min/{1.73_m2} — ABNORMAL LOW (ref >60)
Globulin: 4.1 g/dL — ABNORMAL HIGH (ref 2.0–4.0)
Glucose: 130 mg/dL — ABNORMAL HIGH (ref 65–100)
Potassium: 4.1 mmol/L (ref 3.5–5.1)
Protein, total: 7.9 g/dL (ref 6.4–8.2)
Sodium: 138 mmol/L (ref 136–145)

## 2019-04-01 LAB — DRUG SCREEN, URINE
AMPHETAMINES: NEGATIVE
Amphetamine Screen, Urine: NEGATIVE
BARBITURATES: NEGATIVE
BENZODIAZEPINES: NEGATIVE
Barbiturate Screen, Urine: NEGATIVE
Benzodiazepine Screen, Urine: NEGATIVE
COCAINE: NEGATIVE
Cocaine Screen Urine: NEGATIVE
METHADONE: NEGATIVE
Methadone Screen, Urine: NEGATIVE
OPIATES: NEGATIVE
Opiate Screen, Urine: NEGATIVE
PCP Screen, Urine: NEGATIVE
PCP(PHENCYCLIDINE): NEGATIVE
THC (TH-CANNABINOL): NEGATIVE
THC Screen, Urine: NEGATIVE

## 2019-04-01 LAB — URINALYSIS W/MICROSCOPIC
Bacteria: NEGATIVE /hpf
Bilirubin: NEGATIVE
Blood: NEGATIVE
Glucose: NEGATIVE mg/dL
Ketone: NEGATIVE mg/dL
Leukocyte Esterase: NEGATIVE
Nitrites: NEGATIVE
Protein: NEGATIVE mg/dL
Specific gravity: <1.005 (ref 1.003–1.030)
Urobilinogen: 0.2 EU/dL (ref 0.2–1.0)
pH (UA): 5.5 (ref 5.0–8.0)

## 2019-04-01 LAB — ETHYL ALCOHOL
ALCOHOL(ETHYL),SERUM: <10 MG/DL (ref <10)
Ethyl Alcohol: <10 MG/DL (ref <10)

## 2019-04-01 LAB — URINALYSIS WITH MICROSCOPIC
BACTERIA, URINE: NEGATIVE /hpf
Bilirubin, Urine: NEGATIVE
Blood, Urine: NEGATIVE
Glucose, Ur: NEGATIVE mg/dL
Ketones, Urine: NEGATIVE mg/dL
Leukocyte Esterase, Urine: NEGATIVE
Nitrite, Urine: NEGATIVE
Protein, UA: NEGATIVE mg/dL
Specific Gravity, UA: <1.005 NA (ref 1.003–1.030)
Urobilinogen, UA, POCT: 0.2 EU/dL (ref 0.2–1.0)
pH, UA: 5.5 (ref 5.0–8.0)

## 2019-04-01 LAB — CBC WITH AUTO DIFFERENTIAL
Basophils %: 1 % (ref 0–1)
Basophils Absolute: 0.1 10*3/uL (ref 0.0–0.1)
Eosinophils %: 2 % (ref 0–7)
Eosinophils Absolute: 0.1 10*3/uL (ref 0.0–0.4)
Granulocyte Absolute Count: 0 10*3/uL (ref 0.00–0.04)
Hematocrit: 41.9 % (ref 36.6–50.3)
Hemoglobin: 14.9 g/dL (ref 12.1–17.0)
Immature Granulocytes: 0 % (ref 0.0–0.5)
Lymphocytes %: 17 % (ref 12–49)
Lymphocytes Absolute: 1.3 10*3/uL (ref 0.8–3.5)
MCH: 31.2 PG (ref 26.0–34.0)
MCHC: 35.6 g/dL (ref 30.0–36.5)
MCV: 87.7 FL (ref 80.0–99.0)
MPV: 10.1 FL (ref 8.9–12.9)
Monocytes %: 8 % (ref 5–13)
Monocytes Absolute: 0.6 10*3/uL (ref 0.0–1.0)
NRBC Absolute: 0 10*3/uL (ref 0.00–0.01)
Neutrophils %: 72 % (ref 32–75)
Neutrophils Absolute: 5.4 10*3/uL (ref 1.8–8.0)
Nucleated RBCs: 0 PER 100 WBC
Platelets: 212 10*3/uL (ref 150–400)
RBC: 4.78 M/uL (ref 4.10–5.70)
RDW: 13.4 % (ref 11.5–14.5)
WBC: 7.5 10*3/uL (ref 4.1–11.1)

## 2019-04-01 LAB — COMPREHENSIVE METABOLIC PANEL
ALT: 49 U/L (ref 12–78)
AST: 35 U/L (ref 15–37)
Albumin/Globulin Ratio: 0.9 — ABNORMAL LOW (ref 1.1–2.2)
Albumin: 3.8 g/dL (ref 3.5–5.0)
Alkaline Phosphatase: 119 U/L — ABNORMAL HIGH (ref 45–117)
Anion Gap: 6 mmol/L (ref 5–15)
BUN: 15 MG/DL (ref 6–20)
Bun/Cre Ratio: 12 (ref 12–20)
CO2: 24 mmol/L (ref 21–32)
Calcium: 9.2 MG/DL (ref 8.5–10.1)
Chloride: 108 mmol/L (ref 97–108)
Creatinine: 1.22 MG/DL (ref 0.70–1.30)
EGFR IF NonAfrican American: 58 mL/min/{1.73_m2} — ABNORMAL LOW (ref >60)
GFR African American: >60 mL/min/{1.73_m2} (ref >60)
Globulin: 4.1 g/dL — ABNORMAL HIGH (ref 2.0–4.0)
Glucose: 130 mg/dL — ABNORMAL HIGH (ref 65–100)
Potassium: 4.1 mmol/L (ref 3.5–5.1)
Sodium: 138 mmol/L (ref 136–145)
Total Bilirubin: 0.5 MG/DL (ref 0.2–1.0)
Total Protein: 7.9 g/dL (ref 6.4–8.2)

## 2019-04-01 MED ORDER — MORPHINE 10 MG/ML INJ SOLUTION
10 mg/ml | INTRAMUSCULAR | Status: DC
Start: 2019-04-01 — End: 2019-04-01

## 2019-04-01 MED ORDER — ONDANSETRON (PF) 4 MG/2 ML INJECTION
4 mg/2 mL | INTRAMUSCULAR | Status: DC
Start: 2019-04-01 — End: 2019-04-01

## 2019-04-01 NOTE — ED Notes (Signed)
Bedside and Verbal shift change report given to Kalisha, RN (oncoming nurse) by Heather, RN (offgoing nurse). Report included the following information SBAR, ED Summary and Recent Results.
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
## 2019-04-01 NOTE — ED Notes (Signed)
Assumed pt care from triage. Pt brought to the ed by sister. Per pt he lives in [place] but has been staying with his sister since April 6. Per pt he was recently in some legal trouble back home which has left him pretty anxious and paranoid. Pt states he has an extensive psych history and has not been taking any of his medication. Pt states he has been admitted to psych facilities in the past. Per pt he is having si. Pt states he would "choke on something to end his life". Pt denies HI at this time but states he has had them in the past. Per pt he is concerned about his court date that he is supposed to have this upcoming Tuesday in [place]. Pt states "I think im on death row". Pt unable to tell this RN what his legal trouble was for. Pt states he does drink alcohol but denies any drug use. Pt is alert and oriented x4. Sitter placed at pt's bedside for safety of pt.

## 2019-04-01 NOTE — Other (Signed)
[facility] Note    Bed search:    [place]:  No beds at [facility] or [facility].  No appropriate for [facility].    [facility]:  At capacity.  Requires negative COVID-19.    [facility]:  At capacity.    [facility]:  At capacity.    [facility]:  At capacity.    [facility]:  At capacity.    [facility]:  Possible bed but require negative COVID-19.    [facility]:  At capacity.    [facility]:  At capacity.    No possible beds currently.  COVID-19 test result needed for placement outside of [facility], and it still has not resulted at this time.  Next [facility] clinician notified about patient.      Kathryne Hitch, MA

## 2019-04-01 NOTE — ED Notes (Signed)
Spoke with [facility]. They are still working on bed search. Will call ED with updates.
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
## 2019-04-01 NOTE — ED Notes (Signed)
Spoke with [facility]. They are still working on bed search. Will call ED with updates.

## 2019-04-01 NOTE — ED Provider Notes (Signed)
ED Provider Notes by Gearldine Bienenstock, DO at 04/01/19 1841                Author: Gearldine Bienenstock, DO  Service: Emergency Medicine  Author Type: Physician       Filed: 04/03/19 1450  Date of Service: 04/01/19 1841  Status: Signed          Editor: Gearldine Bienenstock, DO (Physician)               EMERGENCY DEPARTMENT HISTORY AND PHYSICAL EXAM           Date: 04/01/2019   Patient Name: Ronald Saunders        History of Presenting Illness          Chief Complaint       Patient presents with        ?  Mental Health Problem             arrived POV- with sister for sucidal ideations, states driver License revoked in Jan, stopped taking meds hx bipolar           History Provided By: Patient      HPI: Ronald Saunders,  78 y.o. male presents to the ED with cc of suicidal thoughts. Pt states prior hx of Bipolar  disease. He has had issues most of his life and when younger had undergone ECT. Pt had had prior hospitalizations secondary to mental health issues. Pt reports most recently marital and legal issues and states currently has charges pending with court  coming up. Pt had voiced to both his sister and brother suicidal thoughts and was brought to the ED by his sister for evaluation. Pt denies any current medical issues including fever or chills, cough or cold symptoms. No recent COVID exposures. He denies  CP, SOA, abd pain n/v/d.                There are no other complaints, changes, or physical findings at this time.      PCP: [facility], Not On File      Meds:    No current Medications        Past History        Past Medical History:   Bipolar      Past Surgical History:   No past surgical history on file.      Family History:   Non-contributory      Social History:     Social History          Tobacco Use         ?  Smoking status:  Prior, not current       Substance Use Topics         ?  Alcohol use:  Occ         ?  Drug use:  None           Allergies:   NKDA        Review of Systems     Review of Systems    Constitutional:  Negative.  Negative for appetite change, chills, fatigue and fever.    HENT: Negative.  Negative for congestion, rhinorrhea, sinus pressure and sore throat.     Eyes: Negative.     Respiratory: Negative.  Negative for cough, choking, chest tightness, shortness of breath and wheezing.     Cardiovascular: Negative.  Negative for chest pain, palpitations and leg swelling.    Gastrointestinal: Negative for abdominal pain, constipation, diarrhea, nausea and  vomiting.    Endocrine: Negative.     Genitourinary: Negative.  Negative for difficulty urinating, dysuria, flank pain and urgency.    Musculoskeletal: Negative.     Skin: Negative.     Neurological: Negative.  Negative for dizziness, speech difficulty, weakness, light-headedness, numbness and headaches.    Psychiatric/Behavioral: Positive for dysphoric mood and suicidal ideas .    All other systems reviewed and are negative.           Physical Exam     Physical Exam   Vitals signs and nursing note reviewed.   Constitutional:        General: He is not in acute distress.     Appearance: He is well-developed. He is obese. He is not diaphoretic.   HENT:       Head: Normocephalic and atraumatic.      Mouth/Throat:      Pharynx: No oropharyngeal exudate.    Eyes:       Conjunctiva/sclera: Conjunctivae normal.      Pupils: Pupils are equal, round, and reactive to light.    Neck:       Musculoskeletal: Normal range of motion and neck supple.      Vascular: No JVD.      Trachea: No tracheal deviation.    Cardiovascular:       Rate and Rhythm: Normal rate and regular rhythm.      Heart sounds: Normal heart sounds. No murmur.    Pulmonary:       Effort: Pulmonary effort is normal. No respiratory distress.      Breath sounds: Normal breath sounds. No stridor. No wheezing or  rales.   Chest :       Chest wall: No tenderness.   Abdominal :      General: There is no distension.      Palpations: Abdomen is soft.      Tenderness: There is no abdominal tenderness. There is no guarding  or rebound.     Musculoskeletal: Normal range of motion.          General: No tenderness.    Skin:      General: Skin is warm and dry.      Capillary Refill: Capillary refill takes less than 2 seconds.    Neurological:       Mental Status: He is alert and oriented to person, place, and time.      Cranial Nerves: No cranial nerve deficit.      Comments: No gross motor or sensory deficits     Psychiatric :      Comments: Flat affect, suicidal thoughts, no plan                 Diagnostic Study Results        Labs -         Recent Results (from the past 12 hour(s))     SARS-COV-2          Collection Time: 04/01/19  1:51 PM         Result  Value  Ref Range            Specimen source  Nasopharyngeal          SARS-CoV-2  PENDING         SARS-CoV-2  PENDING         Specimen source  Nasopharyngeal          COVID-19 rapid test  PENDING  COVID-19  PENDING  NEG           Radiologic Studies -      XR CHEST PA LAT       Final Result     IMPRESSION:           No acute process.                           CT Results  (Last 48 hours)          None                 CXR Results  (Last 48  hours)                                    04/01/19 1331    XR CHEST PA LAT  Final result            Impression:    IMPRESSION:              No acute process.                                     Narrative:    EXAM:  XR CHEST PA LAT             INDICATION:  shortness of breath bipolar             COMPARISON:  None              FINDINGS:             PA and lateral radiographs of the chest demonstrate clear lungs.  The cardiac      and mediastinal contours and pulmonary vascularity are normal.   There is a      chronic left eighth posterior rib fracture. There is a moderate chronic      compression fracture in the lower thoracic spine.                                     Medical Decision Making     I am the first provider for this patient.      I reviewed the vital signs, available nursing notes, past medical history, past surgical history,  family history and social history.      Vital Signs-Reviewed the patient's vital signs.   Patient Vitals for the past 12 hrs:            Temp  Pulse  Resp  BP  SpO2            04/01/19 1746  --  94  18  172/89  97 %            04/01/19 1316  98.9 ??F (37.2 ??C)  100  16  (!) 185/101  97 %              Records Reviewed: Nursing Notes, Old Medical Records, Previous Radiology Studies and  Previous Laboratory Studies      Provider Notes (Medical Decision Making):    DDx- Depressed mood, suicidal thoughts, substance induced mood disorder       ED Course:    Initial assessment performed. The patients presenting problems have been discussed,  and they are in agreement with the care plan formulated and outlined with them.  I have encouraged them to ask questions as they arise throughout their visit.        ED Course as of Apr 02 1448       Mon Apr 02, 2019        0728  [facility] for patient. No beds were available overnight     [KC]     1135  [facility]     [KC]              ED Course User Index   [KC] Catlett, Jairo BenKatharine S, MD         Pt states he drinks but not everyday and not heavy, no binge use, no previous issues with alcohol withdrawal.       6:30 PM Pt is medically cleared. I do not feel there is any reason to suspect COVID at this time. However feeling that this may be needed for placement a test was ordered at the time of the initial evaluation so that could be run and resulted today.       [facility] has evaluated the pt and currently there are no beds available or due to past aggression issues is not appropriate.       [facility] and currently feel admitting pt to the Hospitalist service pending COVID test and bed availability would provide no benefit.    Evette DoffingJohn Teo Moede, DO         11:45 PM   Discussed with Dr. Wendall Papa'Bier at sign out pt awaiting bed placement. He has been provided dinner and breakfast tray ordered for  in the morning.       [facility].       Disposition:   Transfer           Diagnosis        Clinical Impression:       1.  Suicidal ideation            Attestations:      Evette DoffingJohn Giovannie Scerbo, DO      Please note that this dictation was completed with Dragon, the computer voice recognition software.  Quite often unanticipated grammatical, syntax, homophones, and other interpretive errors are inadvertently  transcribed by the computer software.  Please disregard these errors.  Please excuse any errors that have escaped final proofreading.  Thank you.

## 2019-04-01 NOTE — ED Notes (Signed)
Bedside and Verbal shift change report given to Hale Bogus, Charity fundraiser ([employer]abin crew) by Herbert Seta, RN (offgoing nurse). Report included the following information SBAR, ED Summary and Recent Results.

## 2019-04-01 NOTE — ED Notes (Signed)
Assumed pt care from triage. Pt brought to the ed by sister. Per pt he lives in [place] but has been staying with his sister since April 6. Per pt he was recently in some legal trouble back home which has left him pretty anxious and paranoid. Pt states he has an extensive psych history and has not been taking any of his medication. Pt states he has been admitted to psych facilities in the past. Per pt he is having si. Pt states he would "choke on something to end his life". Pt denies HI at this time but states he has had them in the past. Per pt he is concerned about his court date that he is supposed to have this upcoming Tuesday in [place]. Pt states "I think im on death row". Pt unable to tell this RN what his legal trouble was for. Pt states he does drink alcohol but denies any drug use. Pt is alert and oriented x4. Sitter placed at pt's bedside for safety of pt.

## 2019-04-02 ENCOUNTER — Inpatient Hospital Stay
Admit: 2019-04-02 | Discharge: 2019-04-10 | Disposition: A | Discharge status: Home / Self Care | Payer: MEDICARE | Source: Other Acute Inpatient Hospital | Attending: Psychiatry | Admitting: Psychiatry

## 2019-04-02 DIAGNOSIS — F315 Bipolar disorder, current episode depressed, severe, with psychotic features: Secondary | ICD-10-CM

## 2019-04-02 MED ORDER — DIPHENHYDRAMINE HCL 50 MG/ML IJ SOLN
50 mg/mL | Freq: Two times a day (BID) | INTRAMUSCULAR | Status: DC | PRN
Start: 2019-04-02 — End: 2019-04-10

## 2019-04-02 MED ORDER — OLANZAPINE 2.5 MG TAB
2.5 mg | Freq: Four times a day (QID) | ORAL | Status: DC | PRN
Start: 2019-04-02 — End: 2019-04-10

## 2019-04-02 MED ORDER — HALOPERIDOL LACTATE 5 MG/ML IJ SOLN
5 mg/mL | Freq: Four times a day (QID) | INTRAMUSCULAR | Status: DC | PRN
Start: 2019-04-02 — End: 2019-04-10

## 2019-04-02 MED ORDER — MIRTAZAPINE 15 MG TAB
15 mg | Freq: Every evening | ORAL | Status: DC
Start: 2019-04-02 — End: 2019-04-03

## 2019-04-02 MED ORDER — HYDROXYZINE 25 MG TAB
25 mg | Freq: Four times a day (QID) | ORAL | Status: DC | PRN
Start: 2019-04-02 — End: 2019-04-10
  Administered 2019-04-02 – 2019-04-03 (×2): via ORAL

## 2019-04-02 MED ORDER — MAGNESIUM HYDROXIDE 400 MG/5 ML ORAL SUSP
400 mg/5 mL | Freq: Every day | ORAL | Status: DC | PRN
Start: 2019-04-02 — End: 2019-04-10
  Administered 2019-04-03: 15:00:00 via ORAL

## 2019-04-02 MED ORDER — ACETAMINOPHEN 325 MG TABLET
325 mg | ORAL | Status: DC | PRN
Start: 2019-04-02 — End: 2019-04-10

## 2019-04-02 MED ORDER — BENZTROPINE 1 MG TAB
1 mg | Freq: Two times a day (BID) | ORAL | Status: DC | PRN
Start: 2019-04-02 — End: 2019-04-10

## 2019-04-02 MED FILL — HYDROXYZINE 25 MG TAB: 25 mg | ORAL | Qty: 1

## 2019-04-02 NOTE — Other (Addendum)
[facility] bed board reports patient has been accepted to [facility] 7[address] Bed A by S. Gwyneth Sprout, NP on behalf of Dr Karie Mainland. Report can be called at [phone].

## 2019-04-02 NOTE — Behavioral Health Treatment Team (Signed)
TRANSFER - IN REPORT:    Verbal report received from Holly RN(name) on Izik Withers  being received from Acute(unit) for routine progression of care      Report consisted of patient???s Situation, Background, Assessment and   Recommendations(SBAR).     Information from the following report(s) SBAR was reviewed with the receiving nurse.    Opportunity for questions and clarification was provided.      Assessment completed upon patient???s arrival to unit and care assumed.

## 2019-04-02 NOTE — ED Notes (Addendum)
0302: Bedside shift change report given to M. Evans, RN (oncoming nurse) by K. Cox, RN (offgoing nurse). Report included the following information Kardex, SBAR, Recent Results, I/O. Sitter remains at bedside.    0430: Patient resting in bed at this time. No needs at this time. Sitter remains at bedside. No updates from [facility] at this time.     0536: Patient sleeping at this time. Sitter at bedside.    0729: Bedside shift change report given to H. Bowling, RN (oncoming nurse) by M. Evans, RN (offgoing nurse). Report included the following information SBAR, Kardex, ED Summary, Intake/Output, MAR and Recent Results.

## 2019-04-02 NOTE — Progress Notes (Signed)
Problem: Falls - Risk of  Goal: *Absence of Falls  Description: Document Schmid Fall Risk and appropriate interventions in the flowsheet.  Outcome: Progressing Towards Goal  Note: Fall Risk Interventions:            Medication Interventions: Teach patient to arise slowly, Evaluate medications/consider consulting pharmacy       Pt. Received resting in bed, NAD, respirations even and unlabored. Will continue q15 safety checks.

## 2019-04-02 NOTE — ED Notes (Signed)
Report given to Maddie, RN.

## 2019-04-02 NOTE — Behavioral Health Treatment Team (Signed)
Primary Nurse Holly K Beck and [place], BHT performed a dual skin assessment on this patient No impairment noted  Braden score is 23    Two small cuts to head and forehead, pt unsure how they appeared. No drainage, intact skin, scabbed over.

## 2019-04-02 NOTE — ED Notes (Signed)
Pt stable for transport. Pt transported to [facility] at this time by [facility]

## 2019-04-02 NOTE — ED Notes (Signed)
Pt stated he is willing to go anywhere in the state of [place] for psych placement. Bob from [facility] made aware.

## 2019-04-02 NOTE — Progress Notes (Addendum)
At 1618:  Gave Atarax 25 mg for increased anxiety. Will continue to monitor.  PRN Medication Documentation    Specific patient behavior that led to need for PRN medication: increased anxiety  Staff interventions attempted prior to PRN being given: dimmed lighting, watching movie  PRN medication given: Atarax 25 mg  Patient response/effectiveness of PRN medication: will continue to monitor      Patient educated on fall precautions while taking sedative medications. Will continue to educate and monitor.   Problem: Altered Thought Process (Adult/Pediatric)  Goal: *STG: Complies with medication therapy  Outcome: Progressing Towards Goal     Problem: Falls - Risk of  Goal: *Absence of Falls  Description: Document Schmid Fall Risk and appropriate interventions in the flowsheet.  Outcome: Progressing Towards Goal  Note: Fall Risk Interventions:            Medication Interventions: Teach patient to arise slowly, Evaluate medications/consider consulting pharmacy

## 2019-04-02 NOTE — Progress Notes (Signed)
Transferred

## 2019-04-02 NOTE — ED Notes (Signed)
Pt has bed assignment at [facility] room 734 Bed A. This rn called report and gave it to Holly RN. Plan of care discussed and all questions answered. [facility] called with ETA of 1230.

## 2019-04-02 NOTE — ED Notes (Signed)
Bedside report received from Madison Rn. Plan of care discussed and all questions answered. Sitter remains at pt's bedside. Comfort and safety measures in place. Call light in reach.

## 2019-04-02 NOTE — ED Notes (Signed)
Pt sister made aware of pt being admitted to [facility] hospital.

## 2019-04-02 NOTE — Behavioral Health Treatment Team (Signed)
TRANSFER - IN REPORT:    Verbal report received from Heather,RN on Ronald Saunders  being received from [facility] ED(unit) for routine progression of care      Report consisted of patient???s Situation, Background, Assessment and   Recommendations(SBAR).     Information from the following report(s) SBAR was reviewed with the receiving nurse.    Opportunity for questions and clarification was provided.      Assessment completed upon patient???s arrival to unit and care assumed.

## 2019-04-02 NOTE — ED Notes (Signed)
Pt stated he is willing to go anywhere in the state of [place] for psych placement. Nadine Counts from [facility] made aware.

## 2019-04-02 NOTE — ED Notes (Signed)
Pt has bed assignment at [facility] hospital room 734 Bed A. This rn called report and gave it to [facility]. Plan of care discussed and all questions answered. [facility] called with ETA of 1230.

## 2019-04-02 NOTE — Behavioral Health Treatment Team (Signed)
Primary Nurse Lindajo Royal and [place], BHT performed a dual skin assessment on this patient No impairment noted  Braden score is 23    Two small cuts to head and forehead, pt unsure how they appeared. No drainage, intact skin, scabbed over.

## 2019-04-02 NOTE — ED Notes (Signed)
Bedside report received from [facility]. Plan of care discussed and all questions answered. Sitter remains at pt's bedside. Comfort and safety measures in place. Call light in reach.

## 2019-04-02 NOTE — Progress Notes (Signed)
Problem: Falls - Risk of  Goal: *Absence of Falls  Description: Document Ronald Saunders Fall Risk and appropriate interventions in the flowsheet.  Outcome: Progressing Towards Goal  Note: Fall Risk Interventions:            Medication Interventions: Teach patient to arise slowly, Evaluate medications/consider consulting pharmacy       Pt. Received resting in bed, NAD, respirations even and unlabored. Will continue q15 safety checks.
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
## 2019-04-02 NOTE — ED Notes (Signed)
0302: Bedside shift change report given to M. Logan Bores, RN (Cabin crew) by Kirtland Bouchard. Cox, RN Physiological scientist). Report included the following information Kardex, SBAR, Recent Results, I/O. Sitter remains at bedside.    0430: Patient resting in bed at this time. No needs at this time. Sitter remains at bedside. No updates from [facility] at this time.     0604: Patient sleeping at this time. Sitter at bedside.    6516: Bedside shift change report given to H. Suzette Battiest, RN (Cabin crew) by Judie Petit. Logan Bores, RN Physiological scientist). Report included the following information SBAR, Kardex, ED Summary, Intake/Output, MAR and Recent Results.

## 2019-04-02 NOTE — ED Notes (Signed)
Pt sister made aware of pt being admitted to [facility] hospital.

## 2019-04-02 NOTE — Behavioral Health Treatment Team (Signed)
 TRANSFER - IN REPORT:    Verbal report received from [facility]) on Ronald Saunders  being received from Acute(unit) for routine progression of care      Report consisted of patient's Situation, Background, Assessment and   Recommendations(SBAR).     Information from the following report(s) SBAR was reviewed with the receiving nurse.    Opportunity for questions and clarification was provided.      Assessment completed upon patient's arrival to unit and care assumed.

## 2019-04-02 NOTE — ED Notes (Signed)
Pt stable for transport. Pt transported to [facility] at this time by [facility]

## 2019-04-02 NOTE — Behavioral Health Treatment Team (Signed)
TRANSFER - IN REPORT:    Verbal report received from [facility] on Ronald Saunders  being received from [facility] ED(unit) for routine progression of care      Report consisted of patient's Situation, Background, Assessment and   Recommendations(SBAR).     Information from the following report(s) SBAR was reviewed with the receiving nurse.    Opportunity for questions and clarification was provided.      Assessment completed upon patient's arrival to unit and care assumed.

## 2019-04-02 NOTE — Progress Notes (Signed)
At 1618:  Gave Atarax 25 mg for increased anxiety. Will continue to monitor.  PRN Medication Documentation    Specific patient behavior that led to need for PRN medication: increased anxiety  Staff interventions attempted prior to PRN being given: dimmed lighting, watching movie  PRN medication given: Atarax 25 mg  Patient response/effectiveness of PRN medication: will continue to monitor      Patient educated on fall precautions while taking sedative medications. Will continue to educate and monitor.   Problem: Altered Thought Process (Adult/Pediatric)  Goal: *STG: Complies with medication therapy  Outcome: Progressing Towards Goal     Problem: Falls - Risk of  Goal: *Absence of Falls  Description: Document Schmid Fall Risk and appropriate interventions in the flowsheet.  Outcome: Progressing Towards Goal  Note: Fall Risk Interventions:            Medication Interventions: Teach patient to arise slowly, Evaluate medications/consider consulting pharmacy

## 2019-04-03 LAB — SARS-COV-2: SARS-CoV-2 by PCR: NOT DETECTED

## 2019-04-03 LAB — COVID-19: SARS-CoV-2: NOT DETECTED

## 2019-04-03 MED ORDER — MIRTAZAPINE 15 MG TAB
15 mg | Freq: Every evening | ORAL | Status: DC | PRN
Start: 2019-04-03 — End: 2019-04-10
  Administered 2019-04-04: 01:00:00 via ORAL

## 2019-04-03 MED ORDER — DIVALPROEX 500 MG 24 HR TAB
500 mg | Freq: Every evening | ORAL | Status: DC
Start: 2019-04-03 — End: 2019-04-10
  Administered 2019-04-04 – 2019-04-10 (×8): via ORAL

## 2019-04-03 MED FILL — MILK OF MAGNESIA 400 MG/5 ML ORAL SUSPENSION: 400 mg/5 mL | ORAL | Qty: 30

## 2019-04-03 MED FILL — HYDROXYZINE 25 MG TAB: 25 mg | ORAL | Qty: 1

## 2019-04-03 MED FILL — MIRTAZAPINE 15 MG TAB: 15 mg | ORAL | Qty: 1

## 2019-04-03 NOTE — Progress Notes (Addendum)
Problem: Altered Thought Process (Adult/Pediatric)  Goal: *STG: Participates in treatment plan  Outcome: Progressing Towards Goal  Goal: *STG: Remains safe in hospital  Outcome: Progressing Towards Goal  Goal: *STG: Complies with medication therapy  Outcome: Progressing Towards Goal  Note: 0730 Pt in dining room. Flat affect, pleasant and cooperative. No distress noted. Will monitor with Q 15 safety checks.  Pt is med and meal compliant. Received milk of mag for complaints of hard stool/constipation.

## 2019-04-03 NOTE — Behavioral Health Treatment Team (Signed)
Admission reviewed for medical necessity. Will follow with care mngt.

## 2019-04-03 NOTE — Group Note (Cosign Needed)
IP BH GROUP DOCUMENTATION INDIVIDUAL                                                                          Group Therapy Note    Date: 04/03/2019    Group Start Time: 1700  Group End Time: 1800  Group Topic: Topic Group    [facility]W GEN BEH[facility]    Sasala, Anna    IP BH GROUP DOCUMENTATION GROUP    Group Therapy Note    Attendees: 4/6         Attendance: Did not attend      Anna Sasala

## 2019-04-03 NOTE — Progress Notes (Signed)
Admission Medication Reconciliation:    Information obtained from:  Communication with [facility], review of "Care Everywhere" records, and communication with the patient's sister, [name] ([phone])  RxQuery data available??:  NO    Comments/Recommendations: Updated PTA meds/reviewed patient's allergies.    1)  Patient is a poor historian and unable to provide an accurate history regarding his medications. The patient is currently living with his sister, Manuela Schwartz. She reports that she has not seen him take any medications and he had reported to her that he was not on any medications.     Review of EMR shows that Mr. Hanf had a primary care office visit at [facility] in 08/2018. At that time the provider noted that he was taking amlodipine, atorvastatin, and aspirin. He was also prescribed metformin, tamsulosin, olanzapine, and valproic acid but was reported that he was not taking them. The patient was noted to be manic during visit and actually left office prematurely. Patient has a history of diabetes type 2, hypertension, hyperlipidemia, and BPH.    [facility] was called (mentioned in a note from [facility]) to confirm medications. He has not filled any medications with them since 08/2018.   - 09/2018 (never filled - on hold) - metformin 1000mg  BID  - 08/2018 (never filled - on hold) - olanzapine 2.5mg  QHS  - 08/2018 - divalproex DR 500mg  BID  - 06/2018 - atorvastatin 40mg  daily  - 05/2018 - losartan 50mg  daily + amlodipine 10mg  daily + docusate 100mg  daily     Recommendations:  - restart aspirin 81mg  daily  - restart atorvastatin 40mg  daily  - restart tamsulosin 0.4mg  daily  - add POCT glucose testing with sliding scale insulin PRN  - continue close monitoring of blood pressure and restarting losartan (if needed)  - hold metformin due to renal function  - hold olanzapine due to history of DM and already restarted on VPA (if antipsychotic needed please consider agent with less risk of metabolic  adverse effects (unfortunately, limited information about previous medication history).     Medication history per review of [facility] notes since 2016:   - VPA, olanzapine, lithium    2)  Medication changes (since last review): none    3)  The [place] [employer] (PMP) was assessed to determine fill history of any controlled medications. The patient has NOT filled any controlled medications in the last 2 years.   ??RxQuery pharmacy benefit data reflects medications filled and processed through the patient's insurance, however   this data does NOT capture whether the medication was picked up or is currently being taken by the patient.    Allergies:  Patient has no known allergies.    Significant PMH/Disease States: No past medical history on file.    Chief Complaint for this Admission:  No chief complaint on file.    Prior to Admission Medications:   None     Patrick Jupiter, [facility]

## 2019-04-03 NOTE — Progress Notes (Addendum)
At 1635:  PRN Medication Documentation    Specific patient behavior that led to need for PRN medication: increased anxiety after talking with sister on phone  Staff interventions attempted prior to PRN being given: therapeutic listening  PRN medication given: Atarax  Patient response/effectiveness of PRN medication: will continue to monitor      Upon shift start, patient advised this staff member that he was to see the MD at 1600. Patient educated on seeing MD this morning. Patient seems to be experiencing short term memory loss. Will continue to monitor and report off for shift report.   Patient educated on fall precautions while taking sedative medications. Will continue to educate and monitor.   Problem: Altered Thought Process (Adult/Pediatric)  Goal: *STG: Complies with medication therapy  Outcome: Progressing Towards Goal     Problem: Falls - Risk of  Goal: *Absence of Falls  Description: Document Schmid Fall Risk and appropriate interventions in the flowsheet.  Outcome: Progressing Towards Goal  Note: Fall Risk Interventions:            Medication Interventions: Teach patient to arise slowly, Evaluate medications/consider consulting pharmacy
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
## 2019-04-03 NOTE — Other (Addendum)
Behavioral Health Interdisciplinary Rounds     Patient Name: Ronald Saunders  Age: 78 y.o.  Room/Bed:  727/01  Primary Diagnosis: <principal problem not specified>   Admission Status: Voluntary     Readmission within 30 days: no  Power of Attorney in place: no  Patient requires a blocked bed: no          Reason for blocked bed:     VTE Prophylaxis: No    Mobility needs/Fall risk: no  Flu Vaccine : not flu season   Nutritional Plan: no  Consults:          Labs/Testing due today?: no    Sleep hours: 8+       Participation in Care/Groups:    Medication Compliant?: Selective  PRNS (last 24 hours): Antianxiety    Restraints (last 24 hours):  no     CIWA (range last 24 hours):     COWS (range last 24 hours):      Alcohol screening (AUDIT) completed -         If applicable, date SBIRT discussed in treatment team AND documented:   AUDIT Screen Score:      Tobacco - patient is a smoker: Have You Used Tobacco in the Past 30 Days: No  Illegal Drugs use: Have You Used Any Illegal Substances Over the Past 12 Months: No    24 hour chart check complete: yes     Patient goal(s) for today: meet treatment team, acclimate to unit  Treatment team focus/goals: assess needs for treatment and safe discharge  Progress note: Pt is alert, disoriented and confused. He is more focused on legal issues and family dynamics than his mental health concerns and psychiatric hospitalization.    LOS:  1  Expected LOS: TBD    Financial concerns/prescription coverage:  [place]   Family contact: sister [name] [phone])  Family requesting physician contact today: no  Discharge plan: homeless  Access to weapons : no     Outpatient provider(s): to be linked  Patient's preferred phone number for follow up call : [phone]     Participating treatment team members: [name], Thamas Jaegers, NP; Al. [place] Crane, RN; Charmayne Sheer, PharmD; Lenard Simmer, MSW

## 2019-04-03 NOTE — Progress Notes (Signed)
Problem: Falls - Risk of  Goal: *Absence of Falls  Description: Document Schmid Fall Risk and appropriate interventions in the flowsheet.  Outcome: Progressing Towards Goal  Note: Fall Risk Interventions:            Medication Interventions: Teach patient to arise slowly    Pt. Received resting in bed, NAD, respirations even and unlabored. Will continue q15 safety checks.

## 2019-04-03 NOTE — Progress Notes (Signed)
At 1635:  PRN Medication Documentation    Specific patient behavior that led to need for PRN medication: increased anxiety after talking with sister on phone  Staff interventions attempted prior to PRN being given: therapeutic listening  PRN medication given: Atarax  Patient response/effectiveness of PRN medication: will continue to monitor      Upon shift start, patient advised this staff member that he was to see the MD at 1600. Patient educated on seeing MD this morning. Patient seems to be experiencing short term memory loss. Will continue to monitor and report off for shift report.   Patient educated on fall precautions while taking sedative medications. Will continue to educate and monitor.   Problem: Altered Thought Process (Adult/Pediatric)  Goal: *STG: Complies with medication therapy  Outcome: Progressing Towards Goal     Problem: Falls - Risk of  Goal: *Absence of Falls  Description: Document Schmid Fall Risk and appropriate interventions in the flowsheet.  Outcome: Progressing Towards Goal  Note: Fall Risk Interventions:            Medication Interventions: Teach patient to arise slowly, Evaluate medications/consider consulting pharmacy
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
## 2019-04-03 NOTE — Progress Notes (Signed)
Problem: Altered Thought Process (Adult/Pediatric)  Goal: *STG: Participates in treatment plan  Outcome: Progressing Towards Goal  Goal: *STG: Remains safe in hospital  Outcome: Progressing Towards Goal  Goal: *STG: Complies with medication therapy  Outcome: Progressing Towards Goal  Note: 0730 Pt in dining room. Flat affect, pleasant and cooperative. No distress noted. Will monitor with Q 15 safety checks.  Pt is med and meal compliant. Received milk of mag for complaints of hard stool/constipation.

## 2019-04-03 NOTE — Behavioral Health Treatment Team (Signed)
Admission reviewed for medical necessity. Will follow with care mngt.

## 2019-04-03 NOTE — Progress Notes (Signed)
 Admission Medication Reconciliation:    Information obtained from:  Communication with [facility] pharmacy, review of Care Everywhere records, and communication with the patient's sister, Ronald Saunders ([phone])  RxQuery data available:  NO    Comments/Recommendations: Updated PTA meds/reviewed patient's allergies.    1)  Patient is a poor historian and unable to provide an accurate history regarding his medications. The patient is currently living with his sister, Ronald. She reports that she has not seen him take any medications and he had reported to her that he was not on any medications.     Review of EMR shows that Mr. Stong had a primary care office visit at [facility] in 08/2018. At that time the provider noted that he was taking amlodipine, atorvastatin, and aspirin. He was also prescribed metformin, tamsulosin, olanzapine, and valproic acid but was reported that he was not taking them. The patient was noted to be manic during visit and actually left office prematurely. Patient has a history of diabetes type 2, hypertension, hyperlipidemia, and BPH.    [facility] was called (mentioned in a note from [facility]) to confirm medications. He has not filled any medications with them since 08/2018.   - 09/2018 (never filled - on hold) - metformin 1000mg  BID  - 08/2018 (never filled - on hold) - olanzapine 2.5mg  QHS  - 08/2018 - divalproex DR 500mg  BID  - 06/2018 - atorvastatin 40mg  daily  - 05/2018 - losartan 50mg  daily + amlodipine 10mg  daily + docusate 100mg  daily     Recommendations:  - restart aspirin 81mg  daily  - restart atorvastatin 40mg  daily  - restart tamsulosin 0.4mg  daily  - add POCT glucose testing with sliding scale insulin PRN  - continue close monitoring of blood pressure and restarting losartan (if needed)  - hold metformin due to renal function  - hold olanzapine due to history of DM and already restarted on VPA (if antipsychotic needed please consider agent with less risk of metabolic adverse effects  (unfortunately, limited information about previous medication history).     Medication history per review of [facility] notes since 2016:   - VPA, olanzapine, lithium    2)  Medication changes (since last review): none    3)  The [place]  and [place]  Prescription Monitoring Program (PMP) was assessed to determine fill history of any controlled medications. The patient has NOT filled any controlled medications in the last 2 years.   RxQuery pharmacy benefit data reflects medications filled and processed through the patient's insurance, however   this data does NOT capture whether the medication was picked up or is currently being taken by the patient.    Allergies:  Patient has no known allergies.    Significant PMH/Disease States: No past medical history on file.    Chief Complaint for this Admission:  No chief complaint on file.    Prior to Admission Medications:   None     Isaiah Sharps, [facility]

## 2019-04-03 NOTE — Progress Notes (Signed)
 Problem: Falls - Risk of  Goal: *Absence of Falls  Description: Document Bridgette Habermann Fall Risk and appropriate interventions in the flowsheet.  Outcome: Progressing Towards Goal  Note: Fall Risk Interventions:            Medication Interventions: Teach patient to arise slowly    Pt. Received resting in bed, NAD, respirations even and unlabored. Will continue q15 safety checks.

## 2019-04-04 MED ORDER — RISPERIDONE 0.5 MG TAB
0.5 mg | Freq: Every evening | ORAL | Status: DC
Start: 2019-04-04 — End: 2019-04-05
  Administered 2019-04-05: 01:00:00 via ORAL

## 2019-04-04 MED ORDER — RISPERIDONE 1 MG TAB
1 mg | Freq: Every evening | ORAL | Status: DC
Start: 2019-04-04 — End: 2019-04-04

## 2019-04-04 MED FILL — DIVALPROEX 500 MG 24 HR TAB: 500 mg | ORAL | Qty: 2

## 2019-04-04 NOTE — Progress Notes (Signed)
PSYCHIATRIC PROGRESS NOTE       Patient: Ronald Saunders MRN: [id number]  SSN: [id number]    Date of Birth: 08-19-1941  Age: 78 y.o.  Sex: male      Admit Date: 04/02/2019    LOS: 2 days       Chief Complaint:  I dont understand why I have these charges.    Interval History:  Ronald Saunders is calm and pleasantly psychotic. He states that he needs to clarify with FBI that he does not have anything to do with [place] and September 11 attack. He has been off his medications for several months that resulted to his decline. He also shares that is confused why he has all these charges against him. He is also convinced that he heard his lawyer laughing at him while they were talking on the phone. He also believes that he was supposed to get executed on his hearing yesterday. Says he is "trying to avoid trying to hurt himself". He is tolerating his meds and denies side effects.      Past Medical History:  No past medical history on file.      ALLERGIES:(reviewed/updated 04/04/2019)  No Known Allergies    Laboratory report:  Lab Results   Component Value Date/Time    WBC 7.5 04/01/2019 01:21 PM    HGB 14.9 04/01/2019 01:21 PM    HCT 41.9 04/01/2019 01:21 PM    PLATELET 212 04/01/2019 01:21 PM    MCV 87.7 04/01/2019 01:21 PM      Lab Results   Component Value Date/Time    Sodium 138 04/01/2019 01:21 PM    Potassium 4.1 04/01/2019 01:21 PM    Chloride 108 04/01/2019 01:21 PM    CO2 24 04/01/2019 01:21 PM    Anion gap 6 04/01/2019 01:21 PM    Glucose 130 (H) 04/01/2019 01:21 PM    BUN 15 04/01/2019 01:21 PM    Creatinine 1.22 04/01/2019 01:21 PM    BUN/Creatinine ratio 12 04/01/2019 01:21 PM    GFR est AA >60 04/01/2019 01:21 PM    GFR est non-AA 58 (L) 04/01/2019 01:21 PM    Calcium 9.2 04/01/2019 01:21 PM    Bilirubin, total 0.5 04/01/2019 01:21 PM    Alk. phosphatase 119 (H) 04/01/2019 01:21 PM    Protein, total 7.9 04/01/2019 01:21 PM    Albumin 3.8 04/01/2019 01:21 PM    Globulin 4.1 (H) 04/01/2019 01:21 PM     A-G Ratio 0.9 (L) 04/01/2019 01:21 PM    ALT (SGPT) 49 04/01/2019 01:21 PM      Vitals:    04/03/19 1108 04/03/19 1545 04/03/19 2012 04/04/19 0823   BP: 128/87 116/69 138/84 127/85   Pulse: 99 90 (!) 104 93   Resp: _0 Temp: 97.9 ??F (36.6 ??C) 97.4 ??F (36.3 ??C) 97.8 ??F (36.6 ??C) 97.9 ??F (36.6 ??C)   SpO2:  96% 97% 97%       No results found for: VALF2, VALAC, VALP, VALPR, DS6, CRBAM, CRBAMP, CARB2, XCRBAM  No results found for: LITHM    Vital Signs  Patient Vitals for the past 24 hrs:   Temp Pulse Resp BP SpO2   04/04/19 0823 97.9 ??F (36.6 ??C) 93 16 127/85 97 %   04/03/19 2012 97.8 ??F (36.6 ??C) (!) 104 14 138/84 97 %   04/03/19 1545 97.4 ??F (36.3 ??C) 90 16 116/69 96 %   04/03/19 1108 97.9 ??F (36.6 ??C) 99 18 128/87 ???  Wt Readings from Last 3 Encounters:   04/01/19 61.2 kg (135 lb)     Temp Readings from Last 3 Encounters:   04/04/19 97.9 ??F (36.6 ??C)   04/02/19 97.7 ??F (36.5 ??C)     BP Readings from Last 3 Encounters:   04/04/19 127/85   04/02/19 156/85     Pulse Readings from Last 3 Encounters:   04/04/19 93   04/02/19 71       Radiology (reviewed/updated 04/04/2019)  Xr Chest Pa Lat    Result Date: 04/01/2019  EXAM:  XR CHEST PA LAT INDICATION:  shortness of breath bipolar COMPARISON:  None FINDINGS: PA and lateral radiographs of the chest demonstrate clear lungs.  The cardiac and mediastinal contours and pulmonary vascularity are normal.   There is a chronic left eighth posterior rib fracture. There is a moderate chronic compression fracture in the lower thoracic spine.     IMPRESSION: No acute process.       Side Effects: (reviewed/updated 04/04/2019)  None reported or admitted to.    Review of Systems: (reviewed/updated 04/04/2019)  Appetite: good  Sleep: good   All other Review of Systems: negative    Mental Status Exam:  Eye contact: Good eye contact  Psychomotor activity: anxious  Speech is spontaneous  Thought process: Logical and goal directed   Mood is "ok"  Affect: constricted  Perception: No avh   Suicidal ideation: No si  Homicidal ideation: No hi  Insight/judgment: Poor  Cognition is grossly intact      Physical Exam:  Musculoskeletal system: steady gait  Tremor not present  Cog wheeling not present      Assessment and Plan:  Ronald Saunders meets criteria for a diagnosis of Bipolar I disorder, current episode depressed with psychotic features.    Risperdal 0.'5mg'$  qhs, with plan to titrate.  Continue current medications as prescribed.  We will closely monitor for safety.  We will encourage reality orientation.  Disposition planning to continue.       I certify that this patients inpatient psychiatric hospital services furnished since the previous certification were, and continue to be, required for treatment that could reasonably be expected to improve the patient's condition, or for diagnostic study, and that the patient continues to need, on a daily basis, active treatment furnished directly by or requiring the supervision of inpatient psychiatric facility personnel. In addition, the hospital records show that services furnished were intensive treatment services, admission or related services, or equivalent services.      Signed:  Su Hoff, NP  04/04/2019

## 2019-04-04 NOTE — Progress Notes (Signed)
Problem: Discharge Planning  Goal: *Discharge to safe environment  Outcome: Progressing Towards Goal  Note: Patient identifies home as safe environment and plans to return upon discharge.   Goal: *Knowledge of medication management  Outcome: Progressing Towards Goal  Note: Patient is taking medications as prescribed.   Goal: *Knowledge of discharge instructions  Outcome: Progressing Towards Goal  Note: Patient verbalizes understanding of goals for treatment and safe discharge.

## 2019-04-04 NOTE — Progress Notes (Addendum)
Problem: Altered Thought Process (Adult/Pediatric)  Goal: *STG: Participates in treatment plan  Outcome: Progressing Towards Goal  Note: Patient is participatory in treatment team. SI present. Believes that he was going to be executed at his court date. Paranoid about what his charges are - education provided. Paranoid but is calm and cooperative with staff. Continue to monitor.  Goal: *STG: Remains safe in hospital  Outcome: Progressing Towards Goal  Note: Safe  Goal: *STG: Complies with medication therapy  Outcome: Progressing Towards Goal  Note: Compliant  Goal: *STG: Absence of lethality  Outcome: Progressing Towards Goal  Note: Absent  Goal: Interventions  Outcome: Progressing Towards Goal     Problem: Falls - Risk of  Goal: *Absence of Falls  Description: Document Schmid Fall Risk and appropriate interventions in the flowsheet.  Outcome: Progressing Towards Goal  Note: Fall Risk Interventions:            Medication Interventions: Teach patient to arise slowly

## 2019-04-04 NOTE — Progress Notes (Signed)
Problem: Altered Thought Process (Adult/Pediatric)  Goal: *STG: Seeks staff when feelings of anxiety and fear arise  Outcome: Progressing Towards Goal    Pt is alert and has periodic confusion. Affect is flat and mood is depressed.   Calm and cooperative, compliant with meals and medication. Isolative to self but responds appropriately on approach. Denies suicidal thoughts stated  < not today, in the past>.

## 2019-04-04 NOTE — Progress Notes (Signed)
Problem: Falls - Risk of  Goal: *Absence of Falls  Description: Document Schmid Fall Risk and appropriate interventions in the flowsheet.  Outcome: Progressing Towards Goal  Note: Fall Risk Interventions:            Medication Interventions: Teach patient to arise slowly     Pt. Received resting in bed, NAD, respirations even and unlabored. Will continue q15 safety checks.

## 2019-04-04 NOTE — Other (Addendum)
Behavioral Health Interdisciplinary Rounds     Patient Name: Ronald Saunders  Age: 78 y.o.  Room/Bed:  727/01  Primary Diagnosis: <principal problem not specified>   Admission Status: Voluntary     Readmission within 30 days: no  Power of Attorney in place: no  Patient requires a blocked bed: no          Reason for blocked bed:     VTE Prophylaxis: No    Mobility needs/Fall risk: no  Flu Vaccine : not flu season    Nutritional Plan: no  Consults:          Labs/Testing due today?: no    Sleep hours: 8+       Participation in Care/Groups:  no  Medication Compliant?: Yes  PRNS (last 24 hours): Antianxiety and Sleep Aid    Restraints (last 24 hours):  no     CIWA (range last 24 hours):     COWS (range last 24 hours):      Alcohol screening (AUDIT) completed -         If applicable, date SBIRT discussed in treatment team AND documented:   AUDIT Screen Score:      Tobacco - patient is a smoker: Have You Used Tobacco in the Past 30 Days: No  Illegal Drugs use: Have You Used Any Illegal Substances Over the Past 12 Months: No    24 hour chart check complete: yes     Patient goal(s) for today:  Take medications,   Treatment team focus/goals: start respiredol at [facility].  Progress note: Pt is alert, oriented. He endorses paranoid delusions and thinks the FBI is looking for him, he thinks the FBI is looking for him for [place] boming and 9-11 terrorist attacks.He said he called the FBI and they told him "we are not taking any more interviews right now." He reports being confused about his legal charges, and think his lawyers, Ronald Saunders, is laughing at planning to execute him at his court date 04/03/19. He reports SI since he was in jail without relief. He said he denies SI even when present so he doesn't have to "wear a turtle suit" and referring to a straight jacket.    LOS:  2  Expected LOS: TBD  ??  Financial concerns/prescription coverage:  [employer] MEDICARE    Family contact: sister Ronald Saunders [phone])  Family requesting physician contact today: no  Discharge plan: homeless  Access to weapons : no                                    Outpatient provider(s): to be linked  Patient's preferred phone number for follow up call : [phone]   ??  Participating treatment team members: Ronald Saunders, Ronald Jaegers, NP; Ronald Freer, RN; Ronald Saunders, MSW

## 2019-04-04 NOTE — Progress Notes (Signed)
PSYCHIATRIC PROGRESS NOTE       Patient: Ronald Saunders MRN: [id number]  [id number]: [id number]    Date of Birth: 08-19-1941  Age: 78 y.o.  Sex: male      Admit Date: 04/02/2019    LOS: 2 days       Chief Complaint:  I dont understand why I have these charges.    Interval History:  Ronald Saunders is calm and pleasantly psychotic. He states that he needs to clarify with FBI that he does not have anything to do with [place] and September 11 attack. He has been off his medications for several months that resulted to his decline. He also shares that is confused why he has all these charges against him. He is also convinced that he heard his lawyer laughing at him while they were talking on the phone. He also believes that he was supposed to get executed on his hearing yesterday. Says he is "trying to avoid trying to hurt himself". He is tolerating his meds and denies side effects.      Past Medical History:  No past medical history on file.      ALLERGIES:(reviewed/updated 04/04/2019)  No Known Allergies    Laboratory report:  Lab Results   Component Value Date/Time    WBC 7.5 04/01/2019 01:21 PM    HGB 14.9 04/01/2019 01:21 PM    HCT 41.9 04/01/2019 01:21 PM    PLATELET 212 04/01/2019 01:21 PM    MCV 87.7 04/01/2019 01:21 PM      Lab Results   Component Value Date/Time    Sodium 138 04/01/2019 01:21 PM    Potassium 4.1 04/01/2019 01:21 PM    Chloride 108 04/01/2019 01:21 PM    CO2 24 04/01/2019 01:21 PM    Anion gap 6 04/01/2019 01:21 PM    Glucose 130 (H) 04/01/2019 01:21 PM    BUN 15 04/01/2019 01:21 PM    Creatinine 1.22 04/01/2019 01:21 PM    BUN/Creatinine ratio 12 04/01/2019 01:21 PM    GFR est AA >60 04/01/2019 01:21 PM    GFR est non-AA 58 (L) 04/01/2019 01:21 PM    Calcium 9.2 04/01/2019 01:21 PM    Bilirubin, total 0.5 04/01/2019 01:21 PM    Alk. phosphatase 119 (H) 04/01/2019 01:21 PM    Protein, total 7.9 04/01/2019 01:21 PM    Albumin 3.8 04/01/2019 01:21 PM    Globulin 4.1 (H) 04/01/2019 01:21 PM     A-G Ratio 0.9 (L) 04/01/2019 01:21 PM    ALT (SGPT) 49 04/01/2019 01:21 PM      Vitals:    04/03/19 1108 04/03/19 1545 04/03/19 2012 04/04/19 0823   BP: 128/87 116/69 138/84 127/85   Pulse: 99 90 (!) 104 93   Resp: _0 Temp: 97.9 ??F (36.6 ??C) 97.4 ??F (36.3 ??C) 97.8 ??F (36.6 ??C) 97.9 ??F (36.6 ??C)   SpO2:  96% 97% 97%       No results found for: VALF2, VALAC, VALP, VALPR, DS6, CRBAM, CRBAMP, CARB2, XCRBAM  No results found for: LITHM    Vital Signs  Patient Vitals for the past 24 hrs:   Temp Pulse Resp BP SpO2   04/04/19 0823 97.9 ??F (36.6 ??C) 93 16 127/85 97 %   04/03/19 2012 97.8 ??F (36.6 ??C) (!) 104 14 138/84 97 %   04/03/19 1545 97.4 ??F (36.3 ??C) 90 16 116/69 96 %   04/03/19 1108 97.9 ??F (36.6 ??C) 99 18 128/87 ???  Wt Readings from Last 3 Encounters:   04/01/19 61.2 kg (135 lb)     Temp Readings from Last 3 Encounters:   04/04/19 97.9 ??F (36.6 ??C)   04/02/19 97.7 ??F (36.5 ??C)     BP Readings from Last 3 Encounters:   04/04/19 127/85   04/02/19 156/85     Pulse Readings from Last 3 Encounters:   04/04/19 93   04/02/19 71       Radiology (reviewed/updated 04/04/2019)  Xr Chest Pa Lat    Result Date: 04/01/2019  EXAM:  XR CHEST PA LAT INDICATION:  shortness of breath bipolar COMPARISON:  None FINDINGS: PA and lateral radiographs of the chest demonstrate clear lungs.  The cardiac and mediastinal contours and pulmonary vascularity are normal.   There is a chronic left eighth posterior rib fracture. There is a moderate chronic compression fracture in the lower thoracic spine.     IMPRESSION: No acute process.       Side Effects: (reviewed/updated 04/04/2019)  None reported or admitted to.    Review of Systems: (reviewed/updated 04/04/2019)  Appetite: good  Sleep: good   All other Review of Systems: negative    Mental Status Exam:  Eye contact: Good eye contact  Psychomotor activity: anxious  Speech is spontaneous  Thought process: Logical and goal directed   Mood is "ok"  Affect: constricted  Perception: No  avh  Suicidal ideation: No si  Homicidal ideation: No hi  Insight/judgment: Poor  Cognition is grossly intact      Physical Exam:  Musculoskeletal system: steady gait  Tremor not present  Cog wheeling not present      Assessment and Plan:  Ronald Saunders meets criteria for a diagnosis of Bipolar I disorder, current episode depressed with psychotic features.    Risperdal 0.32m qhs, with plan to titrate.  Continue current medications as prescribed.  We will closely monitor for safety.  We will encourage reality orientation.  Disposition planning to continue.       I certify that this patients inpatient psychiatric hospital services furnished since the previous certification were, and continue to be, required for treatment that could reasonably be expected to improve the patient's condition, or for diagnostic study, and that the patient continues to need, on a daily basis, active treatment furnished directly by or requiring the supervision of inpatient psychiatric facility personnel. In addition, the hospital records show that services furnished were intensive treatment services, admission or related services, or equivalent services.      Signed:  SSu Hoff NP  04/04/2019

## 2019-04-04 NOTE — Progress Notes (Signed)
Problem: Altered Thought Process (Adult/Pediatric)  Goal: *STG: Seeks staff when feelings of anxiety and fear arise  Outcome: Progressing Towards Goal    Pt is alert and has periodic confusion. Affect is flat and mood is depressed.   Calm and cooperative, compliant with meals and medication. Isolative to self but responds appropriately on approach. Denies suicidal thoughts stated  < not today, in the past>.

## 2019-04-04 NOTE — Progress Notes (Signed)
Problem: Altered Thought Process (Adult/Pediatric)  Goal: *STG: Participates in treatment plan  Outcome: Progressing Towards Goal  Note: Patient is participatory in treatment team. SI present. Believes that he was going to be executed at his court date. Paranoid about what his charges are - education provided. Paranoid but is calm and cooperative with staff. Continue to monitor.  Goal: *STG: Remains safe in hospital  Outcome: Progressing Towards Goal  Note: Safe  Goal: *STG: Complies with medication therapy  Outcome: Progressing Towards Goal  Note: Compliant  Goal: *STG: Absence of lethality  Outcome: Progressing Towards Goal  Note: Absent  Goal: Interventions  Outcome: Progressing Towards Goal     Problem: Falls - Risk of  Goal: *Absence of Falls  Description: Document Schmid Fall Risk and appropriate interventions in the flowsheet.  Outcome: Progressing Towards Goal  Note: Fall Risk Interventions:            Medication Interventions: Teach patient to arise slowly

## 2019-04-05 LAB — GLUCOSE, POC: Glucose (POC): 207 mg/dL — ABNORMAL HIGH (ref 65–100)

## 2019-04-05 LAB — POCT GLUCOSE: POC Glucose: 207 mg/dL — ABNORMAL HIGH (ref 65–100)

## 2019-04-05 MED ORDER — RISPERIDONE 1 MG TAB
1 mg | Freq: Every evening | ORAL | Status: DC
Start: 2019-04-05 — End: 2019-04-10
  Administered 2019-04-06 – 2019-04-10 (×6): via ORAL

## 2019-04-05 MED ORDER — ATORVASTATIN 10 MG TAB
10 mg | Freq: Every evening | ORAL | Status: DC
Start: 2019-04-05 — End: 2019-04-08
  Administered 2019-04-06 – 2019-04-08 (×4): via ORAL

## 2019-04-05 MED ORDER — ASPIRIN 81 MG CHEWABLE TAB
81 mg | Freq: Every day | ORAL | Status: DC
Start: 2019-04-05 — End: 2019-04-10
  Administered 2019-04-05 – 2019-04-10 (×6): via ORAL

## 2019-04-05 MED FILL — CHILDREN'S ASPIRIN 81 MG CHEWABLE TABLET: 81 mg | ORAL | Qty: 1

## 2019-04-05 MED FILL — DIVALPROEX 500 MG 24 HR TAB: 500 mg | ORAL | Qty: 2

## 2019-04-05 MED FILL — RISPERIDONE 0.5 MG TAB: 0.5 mg | ORAL | Qty: 1

## 2019-04-05 NOTE — Progress Notes (Signed)
Problem: Altered Thought Process (Adult/Pediatric)  Goal: *STG: Participates in treatment plan  Outcome: Progressing Towards Goal  Note: Out on unit engaged. Participating in groups and activities. Fixed delusions remain. Denies SI and reports mood is improved and affect is congruent. Daily goal is to talk with sister about d/c plans and living arrangements. Staff focus is on d/c planning and medication adjustments   Goal: *STG: Seeks staff when feelings of anxiety and fear arise  Outcome: Progressing Towards Goal  Goal: *STG: Attends activities and groups  Outcome: Progressing Towards Goal  Goal: *STG: Decreased delusional thinking  Outcome: Progressing Towards Goal  Goal: *STG: Decreased hallucinations  Outcome: Progressing Towards Goal  Goal: *STG: Demonstrates ability to understand and use improved judgment in daily activities and relationships  Outcome: Progressing Towards Goal  Goal: Interventions  Outcome: Progressing Towards Goal

## 2019-04-05 NOTE — Progress Notes (Signed)
Problem: Altered Thought Process (Adult/Pediatric)  Goal: *STG: Seeks staff when feelings of anxiety and fear arise  Outcome: Progressing Towards Goal    Pt is alert and generally oriented. Visible on the unit, interacting with selected peers, responding appropriately on approach. Remains confused at times,   Compliant with meals and medication. Remains paranoid, asked staff if we are monitoring patient's phone calls. Reassured.

## 2019-04-05 NOTE — Progress Notes (Signed)
Problem: Falls - Risk of  Goal: *Absence of Falls  Description: Document Schmid Fall Risk and appropriate interventions in the flowsheet.  Outcome: Progressing Towards Goal  Note: Fall Risk Interventions:            Medication Interventions: Teach patient to arise slowly    Pt. Received resting in bed, NAD, respirations even and unlabored. Will continue q15 safety checks.

## 2019-04-05 NOTE — Group Note (Cosign Needed)
IP BH GROUP DOCUMENTATION INDIVIDUAL                                                                          Group Therapy Note    Date: 04/05/2019    Group Start Time: 1015  Group End Time: 1044  Group Topic: [employer] Meeting    [facility] 7W GEN BE[facility]    Hazle Nordmann A    IP BH GROUP DOCUMENTATION GROUP    Group Therapy Note    Attendees: 5/7         Attendance: Attended    Patient's Goal:  Doing all of the things that will make me better    Interventions/techniques: Validated    Follows Directions: Followed directions    Interactions: Interacted appropriately    Mental Status: Anxious    Behavior/appearance: Attentive    Goals Achieved: Able to engage in interactions      Additional Notes:      Orbie Pyo

## 2019-04-05 NOTE — Progress Notes (Signed)
Laboratory Monitoring for Antipsychotics:    This patient is currently prescribed the following medication(s):   Current Facility-Administered Medications   Medication Dose Route Frequency    risperiDONE (RisperDAL) tablet 0.5 mg  0.5 mg Oral QHS    divalproex ER (DEPAKOTE ER) 24 hour tablet 1,000 mg  1,000 mg Oral QHS     The following labs have been completed for monitoring of antipsychotics and/or mood stabilizers:    Height, Weight, BMI Estimation  Estimated body mass index is 24.69 kg/m?? as calculated from the following:    Height as of 04/01/19: 157.5 cm (62").    Weight as of 04/01/19: 61.2 kg (135 lb).     Vital Signs/Blood Pressure  Visit Vitals  BP 104/64   Pulse (!) 102   Temp 97.5 ??F (36.4 ??C)   Resp 18   SpO2 97%     Renal Function, Hepatic Function and Chemistry  CrCl cannot be calculated (Unknown ideal weight.).    Lab Results   Component Value Date/Time    Sodium 138 04/01/2019 01:21 PM    Potassium 4.1 04/01/2019 01:21 PM    Chloride 108 04/01/2019 01:21 PM    CO2 24 04/01/2019 01:21 PM    Anion gap 6 04/01/2019 01:21 PM    BUN 15 04/01/2019 01:21 PM    Creatinine 1.22 04/01/2019 01:21 PM    BUN/Creatinine ratio 12 04/01/2019 01:21 PM    Bilirubin, total 0.5 04/01/2019 01:21 PM    Protein, total 7.9 04/01/2019 01:21 PM    Albumin 3.8 04/01/2019 01:21 PM    Globulin 4.1 (H) 04/01/2019 01:21 PM    A-G Ratio 0.9 (L) 04/01/2019 01:21 PM    ALT (SGPT) 49 04/01/2019 01:21 PM    AST (SGOT) 35 04/01/2019 01:21 PM    Alk. phosphatase 119 (H) 04/01/2019 01:21 PM     Lab Results   Component Value Date/Time    Glucose 130 (H) 04/01/2019 01:21 PM     No results found for: HBA1C, HGBE8, HBA1CEXT    Hematology  Lab Results   Component Value Date/Time    WBC 7.5 04/01/2019 01:21 PM    RBC 4.78 04/01/2019 01:21 PM    HGB 14.9 04/01/2019 01:21 PM    HCT 41.9 04/01/2019 01:21 PM    MCV 87.7 04/01/2019 01:21 PM    MCH 31.2 04/01/2019 01:21 PM    MCHC 35.6 04/01/2019 01:21 PM    RDW 13.4 04/01/2019 01:21 PM     PLATELET 212 04/01/2019 01:21 PM     Lipids  No results found for: CHOL, CHOLX, CHLST, CHOLV, 884269, HDL, HDLP, LDL, LDLC, DLDLP, TGLX, TRIGL, TRIGP, CHHD, CHHDX    Thyroid Function  No results found for: TSH, TSH2, TSH3, TSHP, TSHELE, TT3, T3U, T3UP, FRT3, FT3, FT4, FT4P, T4, T4P, FT4T, TT7, TSHEXT    Assessment/Plan:  Will order lipid panel and hemoglobin A1c or fasting glucose to complete the recommended baseline laboratory monitoring based on the patient's current medication regimen.        Patrick Jupiter, PHARMD

## 2019-04-05 NOTE — Progress Notes (Signed)
Chuck actively participated in Spirituality Group about [facility] on Gen Behavioral Health unit.    Chaplain Whitney Daou, M.Div, MACE   [phone])

## 2019-04-05 NOTE — Other (Addendum)
Behavioral Health Interdisciplinary Rounds     Patient Name: Ronald Saunders  Age: 78 y.o.  Room/Bed:  727/01  Primary Diagnosis: <principal problem not specified>   Admission Status: Voluntary     Readmission within 30 days: no  Power of Attorney in place: no  Patient requires a blocked bed: no          Reason for blocked bed:     VTE Prophylaxis: No    Mobility needs/Fall risk: no  Flu Vaccine : not flu season   Nutritional Plan: no  Consults:          Labs/Testing due today?: no    Sleep hours: 8       Participation in Care/Groups:    Medication Compliant?: Yes  PRNS (last 24 hours): None    Restraints (last 24 hours):  no     CIWA (range last 24 hours):     COWS (range last 24 hours):      Alcohol screening (AUDIT) completed -         If applicable, date SBIRT discussed in treatment team AND documented:   AUDIT Screen Score:      Tobacco - patient is a smoker: Have You Used Tobacco in the Past 30 Days: No  Illegal Drugs use: Have You Used Any Illegal Substances Over the Past 12 Months: No    24 hour chart check complete: yes     Patient goal(s) for today: attend groups, take medications  Treatment team focus/goals: titrate medication, coordinate follow up, increase Risperdal to 1mg  at PM; check Depakote level tomorrow.  Progress note: Pt is alert and oriented to person, place and time. He exhibits some confusing (forgetting treatment team meeting, unable to complete menu, uncertain about how to get into bed) He did very well during his MMSE in treatment team.  He reported his mood as "fair." He expressed paranoia his lawyer is laughing at him and out to execute him.   He reported decrease SI on medications.     LOS:  3  Expected LOS: TBD  ??  Financial concerns/prescription coverage:????[employer] MEDICARE??  Family contact:??sister [name] [phone])  Family requesting physician contact today:??MSW spoke with sister who is in agreement with tentative discharge Monday.   Discharge plan:??return to live with sister in [place]  Access to weapons :??no????????????????????????????????????????????????????????????????????  Outpatient provider(s):??to be linked  Patient's preferred phone number for follow up call :??[phone]??  ??  Participating treatment team members:??[name],??Thamas Jaegers, NP; Charmayne Sheer, PharmD J. Ardath Sax, RN; Lenard Simmer, MSW

## 2019-04-05 NOTE — Progress Notes (Addendum)
PSYCHIATRIC PROGRESS NOTE       Patient: Ronald Saunders MRN: [id number]  SSN: [id number]    Date of Birth: Mar 06, 1941  Age: 78 y.o.  Sex: male      Admit Date: 04/02/2019    LOS: 3 days       Chief Complaint:  I am feeling fair.    Interval History:  Izell Labat is calm and pleasant psychotic. Remains paranoid but less so compared to yesterday. He is convinced that he was supposed to be executed on his hearing last Tuesday and that his lawyer was laughing at him. He now reports that he does not have anything to do with [place] or sept 11 attack. Says he has a new court date sometime in august. He denies si hi or avh. His cognition is intact, recall and attention intact. He is able to spell the word world forward and backward, able to remember pen book and paper, was able to say the past 5 [place] presidents, and able to recite 12 months backwards without difficulty. He is also able to provide a meaningful hx about his charges. He states being off his meds had something to do with his recent behaviors. He seems to be making progress.         Past Medical History:  No past medical history on file.      ALLERGIES:(reviewed/updated 04/05/2019)  No Known Allergies    Laboratory report:  Lab Results   Component Value Date/Time    WBC 7.5 04/01/2019 01:21 PM    HGB 14.9 04/01/2019 01:21 PM    HCT 41.9 04/01/2019 01:21 PM    PLATELET 212 04/01/2019 01:21 PM    MCV 87.7 04/01/2019 01:21 PM      Lab Results   Component Value Date/Time    Sodium 138 04/01/2019 01:21 PM    Potassium 4.1 04/01/2019 01:21 PM    Chloride 108 04/01/2019 01:21 PM    CO2 24 04/01/2019 01:21 PM    Anion gap 6 04/01/2019 01:21 PM    Glucose 130 (H) 04/01/2019 01:21 PM    BUN 15 04/01/2019 01:21 PM    Creatinine 1.22 04/01/2019 01:21 PM    BUN/Creatinine ratio 12 04/01/2019 01:21 PM    GFR est AA >60 04/01/2019 01:21 PM    GFR est non-AA 58 (L) 04/01/2019 01:21 PM    Calcium 9.2 04/01/2019 01:21 PM    Bilirubin, total 0.5 04/01/2019 01:21 PM     Alk. phosphatase 119 (H) 04/01/2019 01:21 PM    Protein, total 7.9 04/01/2019 01:21 PM    Albumin 3.8 04/01/2019 01:21 PM    Globulin 4.1 (H) 04/01/2019 01:21 PM    A-G Ratio 0.9 (L) 04/01/2019 01:21 PM    ALT (SGPT) 49 04/01/2019 01:21 PM      Vitals:    04/04/19 1154 04/04/19 1546 04/04/19 1940 04/05/19 0755   BP: 117/84 119/79 111/69 104/64   Pulse: 100 99 96 (!) 102   Resp: '16 18 16 18   ' Temp: 98 ??F (36.7 ??C) 98.1 ??F (36.7 ??C) 98.1 ??F (36.7 ??C) 97.5 ??F (36.4 ??C)   SpO2: 97% 98% 96% 97%       No results found for: VALF2, VALAC, VALP, VALPR, DS6, CRBAM, CRBAMP, CARB2, XCRBAM  No results found for: LITHM    Vital Signs  Patient Vitals for the past 24 hrs:   Temp Pulse Resp BP SpO2   04/05/19 0755 97.5 ??F (36.4 ??C) (!) 102 18 104/64 97 %  04/04/19 1940 98.1 ??F (36.7 ??C) 96 16 111/69 96 %   04/04/19 1546 98.1 ??F (36.7 ??C) 99 18 119/79 98 %   04/04/19 1154 98 ??F (36.7 ??C) 100 16 117/84 97 %     Wt Readings from Last 3 Encounters:   04/01/19 61.2 kg (135 lb)     Temp Readings from Last 3 Encounters:   04/05/19 97.5 ??F (36.4 ??C)   04/02/19 97.7 ??F (36.5 ??C)     BP Readings from Last 3 Encounters:   04/05/19 104/64   04/02/19 156/85     Pulse Readings from Last 3 Encounters:   04/05/19 (!) 102   04/02/19 71       Radiology (reviewed/updated 04/05/2019)  Xr Chest Pa Lat    Result Date: 04/01/2019  EXAM:  XR CHEST PA LAT INDICATION:  shortness of breath bipolar COMPARISON:  None FINDINGS: PA and lateral radiographs of the chest demonstrate clear lungs.  The cardiac and mediastinal contours and pulmonary vascularity are normal.   There is a chronic left eighth posterior rib fracture. There is a moderate chronic compression fracture in the lower thoracic spine.     IMPRESSION: No acute process.       Side Effects: (reviewed/updated 04/05/2019)  None reported or admitted to.    Review of Systems: (reviewed/updated 04/05/2019)  Appetite: good  Sleep: good   All other Review of Systems: negative    Mental Status Exam:   Eye contact: Good eye contact  Psychomotor activity: relaxed  Speech is spontaneous  Thought process: Logical and goal directed   Mood is "ok"  Affect: constricted  Perception: No avh  Suicidal ideation: No si  Homicidal ideation: No hi  Insight/judgment: Poor  Cognition is grossly intact      Physical Exam:  Musculoskeletal system: steady gait  Tremor not present  Cog wheeling not present      Assessment and Plan:  [name] meets criteria for a diagnosis of Bipolar I disorder, current episode depressed with psychotic features.    Increase risperdal to '1mg'$  qhs.  Va level on the 27th.  Continue rest of medications as prescribed.  We will closely monitor for safety.  We will encourage reality orientation.  Disposition planning to continue.       I certify that this patients inpatient psychiatric hospital services furnished since the previous certification were, and continue to be, required for treatment that could reasonably be expected to improve the patient's condition, or for diagnostic study, and that the patient continues to need, on a daily basis, active treatment furnished directly by or requiring the supervision of inpatient psychiatric facility personnel. In addition, the hospital records show that services furnished were intensive treatment services, admission or related services, or equivalent services.      Signed:  Su Hoff, NP  04/05/2019

## 2019-04-05 NOTE — Progress Notes (Signed)
PSYCHIATRIC PROGRESS NOTE       Patient: Ronald Saunders MRN: [id number]  [id number]: [id number]    Date of Birth: Mar 06, 1941  Age: 78 y.o.  Sex: male      Admit Date: 04/02/2019    LOS: 3 days       Chief Complaint:  I am feeling fair.    Interval History:  Ronald Saunders is calm and pleasant psychotic. Remains paranoid but less so compared to yesterday. He is convinced that he was supposed to be executed on his hearing last Tuesday and that his lawyer was laughing at him. He now reports that he does not have anything to do with [place] or sept 11 attack. Says he has a new court date sometime in august. He denies si hi or avh. His cognition is intact, recall and attention intact. He is able to spell the word world forward and backward, able to remember pen book and paper, was able to say the past 5 [place] presidents, and able to recite 12 months backwards without difficulty. He is also able to provide a meaningful hx about his charges. He states being off his meds had something to do with his recent behaviors. He seems to be making progress.         Past Medical History:  No past medical history on file.      ALLERGIES:(reviewed/updated 04/05/2019)  No Known Allergies    Laboratory report:  Lab Results   Component Value Date/Time    WBC 7.5 04/01/2019 01:21 PM    HGB 14.9 04/01/2019 01:21 PM    HCT 41.9 04/01/2019 01:21 PM    PLATELET 212 04/01/2019 01:21 PM    MCV 87.7 04/01/2019 01:21 PM      Lab Results   Component Value Date/Time    Sodium 138 04/01/2019 01:21 PM    Potassium 4.1 04/01/2019 01:21 PM    Chloride 108 04/01/2019 01:21 PM    CO2 24 04/01/2019 01:21 PM    Anion gap 6 04/01/2019 01:21 PM    Glucose 130 (H) 04/01/2019 01:21 PM    BUN 15 04/01/2019 01:21 PM    Creatinine 1.22 04/01/2019 01:21 PM    BUN/Creatinine ratio 12 04/01/2019 01:21 PM    GFR est AA >60 04/01/2019 01:21 PM    GFR est non-AA 58 (L) 04/01/2019 01:21 PM    Calcium 9.2 04/01/2019 01:21 PM    Bilirubin, total 0.5 04/01/2019 01:21 PM     Alk. phosphatase 119 (H) 04/01/2019 01:21 PM    Protein, total 7.9 04/01/2019 01:21 PM    Albumin 3.8 04/01/2019 01:21 PM    Globulin 4.1 (H) 04/01/2019 01:21 PM    A-G Ratio 0.9 (L) 04/01/2019 01:21 PM    ALT (SGPT) 49 04/01/2019 01:21 PM      Vitals:    04/04/19 1154 04/04/19 1546 04/04/19 1940 04/05/19 0755   BP: 117/84 119/79 111/69 104/64   Pulse: 100 99 96 (!) 102   Resp: '16 18 16 18   ' Temp: 98 ??F (36.7 ??C) 98.1 ??F (36.7 ??C) 98.1 ??F (36.7 ??C) 97.5 ??F (36.4 ??C)   SpO2: 97% 98% 96% 97%       No results found for: VALF2, VALAC, VALP, VALPR, DS6, CRBAM, CRBAMP, CARB2, XCRBAM  No results found for: LITHM    Vital Signs  Patient Vitals for the past 24 hrs:   Temp Pulse Resp BP SpO2   04/05/19 0755 97.5 ??F (36.4 ??C) (!) 102 18 104/64 97 %  04/04/19 1940 98.1 ??F (36.7 ??C) 96 16 111/69 96 %   04/04/19 1546 98.1 ??F (36.7 ??C) 99 18 119/79 98 %   04/04/19 1154 98 ??F (36.7 ??C) 100 16 117/84 97 %     Wt Readings from Last 3 Encounters:   04/01/19 61.2 kg (135 lb)     Temp Readings from Last 3 Encounters:   04/05/19 97.5 ??F (36.4 ??C)   04/02/19 97.7 ??F (36.5 ??C)     BP Readings from Last 3 Encounters:   04/05/19 104/64   04/02/19 156/85     Pulse Readings from Last 3 Encounters:   04/05/19 (!) 102   04/02/19 71       Radiology (reviewed/updated 04/05/2019)  Xr Chest Pa Lat    Result Date: 04/01/2019  EXAM:  XR CHEST PA LAT INDICATION:  shortness of breath bipolar COMPARISON:  None FINDINGS: PA and lateral radiographs of the chest demonstrate clear lungs.  The cardiac and mediastinal contours and pulmonary vascularity are normal.   There is a chronic left eighth posterior rib fracture. There is a moderate chronic compression fracture in the lower thoracic spine.     IMPRESSION: No acute process.       Side Effects: (reviewed/updated 04/05/2019)  None reported or admitted to.    Review of Systems: (reviewed/updated 04/05/2019)  Appetite: good  Sleep: good   All other Review of Systems: negative    Mental Status Exam:  Eye  contact: Good eye contact  Psychomotor activity: relaxed  Speech is spontaneous  Thought process: Logical and goal directed   Mood is "ok"  Affect: constricted  Perception: No avh  Suicidal ideation: No si  Homicidal ideation: No hi  Insight/judgment: Poor  Cognition is grossly intact      Physical Exam:  Musculoskeletal system: steady gait  Tremor not present  Cog wheeling not present      Assessment and Plan:  Ronald Saunders meets criteria for a diagnosis of Bipolar I disorder, current episode depressed with psychotic features.    Increase risperdal to 20m qhs.  Va level on the 27th.  Continue rest of medications as prescribed.  We will closely monitor for safety.  We will encourage reality orientation.  Disposition planning to continue.       I certify that this patients inpatient psychiatric hospital services furnished since the previous certification were, and continue to be, required for treatment that could reasonably be expected to improve the patient's condition, or for diagnostic study, and that the patient continues to need, on a daily basis, active treatment furnished directly by or requiring the supervision of inpatient psychiatric facility personnel. In addition, the hospital records show that services furnished were intensive treatment services, admission or related services, or equivalent services.      Signed:  SSu Hoff NP  04/05/2019

## 2019-04-05 NOTE — Progress Notes (Signed)
Problem: Altered Thought Process (Adult/Pediatric)  Goal: *STG: Seeks staff when feelings of anxiety and fear arise  Outcome: Progressing Towards Goal    Pt is alert and generally oriented. Visible on the unit, interacting with selected peers, responding appropriately on approach. Remains confused at times,   Compliant with meals and medication. Remains paranoid, asked staff if we are monitoring patient's phone calls. Reassured.

## 2019-04-05 NOTE — Progress Notes (Signed)
 Laboratory Monitoring for Antipsychotics:    This patient is currently prescribed the following medication(s):   Current Facility-Administered Medications   Medication Dose Route Frequency    risperiDONE (RisperDAL) tablet 0.5 mg  0.5 mg Oral QHS    divalproex ER (DEPAKOTE ER) 24 hour tablet 1,000 mg  1,000 mg Oral QHS     The following labs have been completed for monitoring of antipsychotics and/or mood stabilizers:    Height, Weight, BMI Estimation  Estimated body mass index is 24.69 kg/m as calculated from the following:    Height as of 04/01/19: 157.5 cm (62).    Weight as of 04/01/19: 61.2 kg (135 lb).     Vital Signs/Blood Pressure  Visit Vitals  BP 104/64   Pulse (!) 102   Temp 97.5 F (36.4 C)   Resp 18   SpO2 97%     Renal Function, Hepatic Function and Chemistry  CrCl cannot be calculated (Unknown ideal weight.).    Lab Results   Component Value Date/Time    Sodium 138 04/01/2019 01:21 PM    Potassium 4.1 04/01/2019 01:21 PM    Chloride 108 04/01/2019 01:21 PM    CO2 24 04/01/2019 01:21 PM    Anion gap 6 04/01/2019 01:21 PM    BUN 15 04/01/2019 01:21 PM    Creatinine 1.22 04/01/2019 01:21 PM    BUN/Creatinine ratio 12 04/01/2019 01:21 PM    Bilirubin, total 0.5 04/01/2019 01:21 PM    Protein, total 7.9 04/01/2019 01:21 PM    Albumin 3.8 04/01/2019 01:21 PM    Globulin 4.1 (H) 04/01/2019 01:21 PM    A-G Ratio 0.9 (L) 04/01/2019 01:21 PM    ALT (SGPT) 49 04/01/2019 01:21 PM    AST (SGOT) 35 04/01/2019 01:21 PM    Alk. phosphatase 119 (H) 04/01/2019 01:21 PM     Lab Results   Component Value Date/Time    Glucose 130 (H) 04/01/2019 01:21 PM     No results found for: HBA1C, HGBE8, HBA1CEXT    Hematology  Lab Results   Component Value Date/Time    WBC 7.5 04/01/2019 01:21 PM    RBC 4.78 04/01/2019 01:21 PM    HGB 14.9 04/01/2019 01:21 PM    HCT 41.9 04/01/2019 01:21 PM    MCV 87.7 04/01/2019 01:21 PM    MCH 31.2 04/01/2019 01:21 PM    MCHC 35.6 04/01/2019 01:21 PM    RDW 13.4 04/01/2019 01:21 PM    PLATELET  212 04/01/2019 01:21 PM     Lipids  No results found for: CHOL, CHOLX, CHLST, CHOLV, 884269, HDL, HDLP, LDL, LDLC, DLDLP, TGLX, TRIGL, TRIGP, CHHD, CHHDX    Thyroid Function  No results found for: TSH, TSH2, TSH3, TSHP, TSHELE, TT3, T3U, T3UP, FRT3, FT3, FT4, FT4P, T4, T4P, FT4T, TT7, TSHEXT    Assessment/Plan:  Will order lipid panel and hemoglobin A1c or fasting glucose to complete the recommended baseline laboratory monitoring based on the patient's current medication regimen.        Isaiah Sharps, PHARMD

## 2019-04-05 NOTE — Progress Notes (Signed)
Ronald Saunders actively participated in Spirituality Group about [facility] on [employer] unit.    Chaplain Whitney Daou, M.Div, MACE   [phone])

## 2019-04-05 NOTE — Progress Notes (Signed)
Problem: Altered Thought Process (Adult/Pediatric)  Goal: *STG: Participates in treatment plan  Outcome: Progressing Towards Goal  Note: Out on unit engaged. Participating in groups and activities. Fixed delusions remain. Denies SI and reports mood is improved and affect is congruent. Daily goal is to talk with sister about d/c plans and living arrangements. Staff focus is on d/c planning and medication adjustments   Goal: *STG: Seeks staff when feelings of anxiety and fear arise  Outcome: Progressing Towards Goal  Goal: *STG: Attends activities and groups  Outcome: Progressing Towards Goal  Goal: *STG: Decreased delusional thinking  Outcome: Progressing Towards Goal  Goal: *STG: Decreased hallucinations  Outcome: Progressing Towards Goal  Goal: *STG: Demonstrates ability to understand and use improved judgment in daily activities and relationships  Outcome: Progressing Towards Goal  Goal: Interventions  Outcome: Progressing Towards Goal

## 2019-04-06 LAB — GLUCOSE, POC
Glucose (POC): 114 mg/dL — ABNORMAL HIGH (ref 65–100)
Glucose (POC): 132 mg/dL — ABNORMAL HIGH (ref 65–100)

## 2019-04-06 LAB — POCT GLUCOSE
POC Glucose: 114 mg/dL — ABNORMAL HIGH (ref 65–100)
POC Glucose: 132 mg/dL — ABNORMAL HIGH (ref 65–100)

## 2019-04-06 MED FILL — DIVALPROEX 500 MG 24 HR TAB: 500 mg | ORAL | Qty: 2

## 2019-04-06 MED FILL — CHILDREN'S ASPIRIN 81 MG CHEWABLE TABLET: 81 mg | ORAL | Qty: 1

## 2019-04-06 MED FILL — RISPERIDONE 1 MG TAB: 1 mg | ORAL | Qty: 1

## 2019-04-06 MED FILL — ATORVASTATIN 10 MG TAB: 10 mg | ORAL | Qty: 1

## 2019-04-06 NOTE — Progress Notes (Signed)
PSYCHIATRIC PROGRESS NOTE       Patient: Ronald Saunders MRN: [id number]  SSN: [id number]    Date of Birth: [birth year]  Age: 78 y.o.  Sex: male      Admit Date: 04/02/2019    LOS: 4 days       Chief Complaint:  I am feeling better.    Interval History:  Ronald Saunders is calm and pleasant. Paranoia is decreasing a little, he's been appropriate in the unit, no behavioral issue. Denies si hi or avh. He is tolerating his meds and denies side effects. Sleeping and resting well. Continues to make progress.         Past Medical History:  No past medical history on file.      ALLERGIES:(reviewed/updated 04/06/2019)  No Known Allergies    Laboratory report:  Lab Results   Component Value Date/Time    WBC 7.5 04/01/2019 01:21 PM    HGB 14.9 04/01/2019 01:21 PM    HCT 41.9 04/01/2019 01:21 PM    PLATELET 212 04/01/2019 01:21 PM    MCV 87.7 04/01/2019 01:21 PM      Lab Results   Component Value Date/Time    Sodium 138 04/01/2019 01:21 PM    Potassium 4.1 04/01/2019 01:21 PM    Chloride 108 04/01/2019 01:21 PM    CO2 24 04/01/2019 01:21 PM    Anion gap 6 04/01/2019 01:21 PM    Glucose 130 (H) 04/01/2019 01:21 PM    BUN 15 04/01/2019 01:21 PM    Creatinine 1.22 04/01/2019 01:21 PM    BUN/Creatinine ratio 12 04/01/2019 01:21 PM    GFR est AA >60 04/01/2019 01:21 PM    GFR est non-AA 58 (L) 04/01/2019 01:21 PM    Calcium 9.2 04/01/2019 01:21 PM    Bilirubin, total 0.5 04/01/2019 01:21 PM    Alk. phosphatase 119 (H) 04/01/2019 01:21 PM    Protein, total 7.9 04/01/2019 01:21 PM    Albumin 3.8 04/01/2019 01:21 PM    Globulin 4.1 (H) 04/01/2019 01:21 PM    A-G Ratio 0.9 (L) 04/01/2019 01:21 PM    ALT (SGPT) 49 04/01/2019 01:21 PM      Vitals:    04/05/19 1408 04/05/19 1615 04/05/19 2045 04/06/19 0729   BP:  97/61 133/77 136/87   Pulse:  85 81 85   Resp:  '18 18 16   '$ Temp:  97.3 ??F (36.3 ??C) 97.4 ??F (36.3 ??C) 97.8 ??F (36.6 ??C)   SpO2:  97% 99% 98%   Weight: 63 kg (139 lb)           No results found for: VALF2, VALAC, VALP, VALPR, DS6, CRBAM, CRBAMP, CARB2, XCRBAM  No results found for: LITHM    Vital Signs  Patient Vitals for the past 24 hrs:   Temp Pulse Resp BP SpO2   04/06/19 0729 97.8 ??F (36.6 ??C) 85 16 136/87 98 %   04/05/19 2045 97.4 ??F (36.3 ??C) 81 18 133/77 99 %   04/05/19 1615 97.3 ??F (36.3 ??C) 85 18 97/61 97 %     Wt Readings from Last 3 Encounters:   04/05/19 63 kg (139 lb)   04/01/19 61.2 kg (135 lb)     Temp Readings from Last 3 Encounters:   04/06/19 97.8 ??F (36.6 ??C)   04/02/19 97.7 ??F (36.5 ??C)     BP Readings from Last 3 Encounters:   04/06/19 136/87   04/02/19 156/85     Pulse Readings from Last 3 Encounters:  04/06/19 85   04/02/19 71       Radiology (reviewed/updated 04/06/2019)  Xr Chest Pa Lat    Result Date: 04/01/2019  EXAM:  XR CHEST PA LAT INDICATION:  shortness of breath bipolar COMPARISON:  None FINDINGS: PA and lateral radiographs of the chest demonstrate clear lungs.  The cardiac and mediastinal contours and pulmonary vascularity are normal.   There is a chronic left eighth posterior rib fracture. There is a moderate chronic compression fracture in the lower thoracic spine.     IMPRESSION: No acute process.       Side Effects: (reviewed/updated 04/06/2019)  None reported or admitted to.    Review of Systems: (reviewed/updated 04/06/2019)  Appetite: good  Sleep: good   All other Review of Systems: negative    Mental Status Exam:  Eye contact: Good eye contact  Psychomotor activity: relaxed  Speech is spontaneous  Thought process: Logical and goal directed   Mood is "ok"  Affect: constricted  Perception: No avh  Suicidal ideation: No si  Homicidal ideation: No hi  Insight/judgment: Poor  Cognition is grossly intact      Physical Exam:  Musculoskeletal system: steady gait  Tremor not present  Cog wheeling not present      Assessment and Plan:  [name] meets criteria for a diagnosis of Bipolar I disorder, current episode depressed with psychotic features.       Va level on the 27th.  Continue rest of medications as prescribed.  We will closely monitor for safety.  We will encourage reality orientation.  Disposition planning to continue.       I certify that this patients inpatient psychiatric hospital services furnished since the previous certification were, and continue to be, required for treatment that could reasonably be expected to improve the patient's condition, or for diagnostic study, and that the patient continues to need, on a daily basis, active treatment furnished directly by or requiring the supervision of inpatient psychiatric facility personnel. In addition, the hospital records show that services furnished were intensive treatment services, admission or related services, or equivalent services.      Signed:  Su Hoff, NP  04/06/2019

## 2019-04-06 NOTE — Other (Addendum)
Behavioral Health Interdisciplinary Rounds     Patient Name: Ronald Saunders  Age: 78 y.o.  Room/Bed:  733/01  Primary Diagnosis: <principal problem not specified>   Admission Status: Voluntary     Readmission within 30 days: no  Power of Attorney in place: no  Patient requires a blocked bed: no          Reason for blocked bed:     VTE Prophylaxis: No    Mobility needs/Fall risk: no  Flu Vaccine : no   Nutritional Plan: no  Consults:          Labs/Testing due today?: no    Sleep hours: 8       Participation in Care/Groups:  yes  Medication Compliant?: Yes  PRNS (last 24 hours): None    Restraints (last 24 hours):  no     CIWA (range last 24 hours):     COWS (range last 24 hours):      Alcohol screening (AUDIT) completed -         If applicable, date SBIRT discussed in treatment team AND documented:   AUDIT Screen Score:      Tobacco - patient is a smoker: Have You Used Tobacco in the Past 30 Days: No  Illegal Drugs use: Have You Used Any Illegal Substances Over the Past 12 Months: No    24 hour chart check complete: yes     Patient goal(s) for today: attend groups, take medications  Treatment team focus/goals: titrate medication, coordinate follow up, check Depakote level .  Progress note: Pt is alert, oriented and reported "I am doing good I think". He reported sleeping well, and endorsed feeling better on medications. Plan for discharge Monday or Tuesday.     LOS:  4  Expected LOS: TBD  ??  Financial concerns/prescription coverage:????[employer] MEDICARE??  Family contact:??sister [name] [phone])  Family requesting physician contact today:??MSW spoke with sister who is in agreement with tentative discharge Monday.  Discharge plan:??return to live with sister in [place]  Access to weapons :??no????????????????????????????????????????????????????????????????????  Outpatient provider(s):??to be linked  Patient's preferred phone number for follow up call :??[phone]??  ??   Participating treatment team members:??[name],??Thamas Jaegers, NP; Charmayne Sheer, PharmD J. Ardath Sax, RN; Lenard Simmer, MSW

## 2019-04-06 NOTE — Progress Notes (Signed)
Problem: Altered Thought Process (Adult/Pediatric)  Goal: *STG: Participates in treatment plan  Outcome: Progressing Towards Goal  Note: Out on unit passively engaged. Mood and affect brighter fuller range and stable. Denies SI, AH, decrease focus on delusions. Medication education is focus and dc planning  Goal: *STG: Seeks staff when feelings of anxiety and fear arise  Outcome: Progressing Towards Goal  Goal: *STG: Attends activities and groups  Outcome: Progressing Towards Goal  Goal: *STG: Decreased delusional thinking  Outcome: Progressing Towards Goal  Goal: *STG: Decreased hallucinations  Outcome: Progressing Towards Goal  Goal: *STG: Demonstrates ability to understand and use improved judgment in daily activities and relationships  Outcome: Progressing Towards Goal  Goal: Interventions  Outcome: Progressing Towards Goal

## 2019-04-06 NOTE — Progress Notes (Signed)
Problem: Altered Thought Process (Adult/Pediatric)  Goal: *STG: Participates in treatment plan  Outcome: Progressing Towards Goal  Goal: *STG: Seeks staff when feelings of anxiety and fear arise  Outcome: Progressing Towards Goal  Goal: *STG: Attends activities and groups  Outcome: Progressing Towards Goal    Pt is out on the unit passively engaged.   Mood is euthymic affect is flat   No S/I AH or VH   NAD noted   Will continue to monitor.

## 2019-04-06 NOTE — Progress Notes (Signed)
Problem: Altered Thought Process (Adult/Pediatric)  Goal: *STG: Participates in treatment plan  Outcome: Progressing Towards Goal  Goal: *STG: Seeks staff when feelings of anxiety and fear arise  Outcome: Progressing Towards Goal  Goal: *STG: Attends activities and groups  Outcome: Progressing Towards Goal    Pt is out on the unit passively engaged.   Mood is euthymic affect is flat   No S/I AH or VH   NAD noted   Will continue to monitor.

## 2019-04-06 NOTE — Progress Notes (Signed)
PSYCHIATRIC PROGRESS NOTE       Patient: Ronald Saunders MRN: [id number]  SSN: [id number]    Date of Birth: 1941/06/01  Age: 78 y.o.  Sex: male      Admit Date: 04/02/2019    LOS: 4 days       Chief Complaint:  I am feeling better.    Interval History:  Renardo Cheatum is calm and pleasant. Paranoia is decreasing a little, he's been appropriate in the unit, no behavioral issue. Denies si hi or avh. He is tolerating his meds and denies side effects. Sleeping and resting well. Continues to make progress.         Past Medical History:  No past medical history on file.      ALLERGIES:(reviewed/updated 04/06/2019)  No Known Allergies    Laboratory report:  Lab Results   Component Value Date/Time    WBC 7.5 04/01/2019 01:21 PM    HGB 14.9 04/01/2019 01:21 PM    HCT 41.9 04/01/2019 01:21 PM    PLATELET 212 04/01/2019 01:21 PM    MCV 87.7 04/01/2019 01:21 PM      Lab Results   Component Value Date/Time    Sodium 138 04/01/2019 01:21 PM    Potassium 4.1 04/01/2019 01:21 PM    Chloride 108 04/01/2019 01:21 PM    CO2 24 04/01/2019 01:21 PM    Anion gap 6 04/01/2019 01:21 PM    Glucose 130 (H) 04/01/2019 01:21 PM    BUN 15 04/01/2019 01:21 PM    Creatinine 1.22 04/01/2019 01:21 PM    BUN/Creatinine ratio 12 04/01/2019 01:21 PM    GFR est AA >60 04/01/2019 01:21 PM    GFR est non-AA 58 (L) 04/01/2019 01:21 PM    Calcium 9.2 04/01/2019 01:21 PM    Bilirubin, total 0.5 04/01/2019 01:21 PM    Alk. phosphatase 119 (H) 04/01/2019 01:21 PM    Protein, total 7.9 04/01/2019 01:21 PM    Albumin 3.8 04/01/2019 01:21 PM    Globulin 4.1 (H) 04/01/2019 01:21 PM    A-G Ratio 0.9 (L) 04/01/2019 01:21 PM    ALT (SGPT) 49 04/01/2019 01:21 PM      Vitals:    04/05/19 1408 04/05/19 1615 04/05/19 2045 04/06/19 0729   BP:  97/61 133/77 136/87   Pulse:  85 81 85   Resp:  '18 18 16   ' Temp:  97.3 ??F (36.3 ??C) 97.4 ??F (36.3 ??C) 97.8 ??F (36.6 ??C)   SpO2:  97% 99% 98%   Weight: 63 kg (139 lb)          No results found for: VALF2, VALAC, VALP, VALPR, DS6,  CRBAM, CRBAMP, CARB2, XCRBAM  No results found for: LITHM    Vital Signs  Patient Vitals for the past 24 hrs:   Temp Pulse Resp BP SpO2   04/06/19 0729 97.8 ??F (36.6 ??C) 85 16 136/87 98 %   04/05/19 2045 97.4 ??F (36.3 ??C) 81 18 133/77 99 %   04/05/19 1615 97.3 ??F (36.3 ??C) 85 18 97/61 97 %     Wt Readings from Last 3 Encounters:   04/05/19 63 kg (139 lb)   04/01/19 61.2 kg (135 lb)     Temp Readings from Last 3 Encounters:   04/06/19 97.8 ??F (36.6 ??C)   04/02/19 97.7 ??F (36.5 ??C)     BP Readings from Last 3 Encounters:   04/06/19 136/87   04/02/19 156/85     Pulse Readings from Last 3 Encounters:  04/06/19 85   04/02/19 71       Radiology (reviewed/updated 04/06/2019)  Xr Chest Pa Lat    Result Date: 04/01/2019  EXAM:  XR CHEST PA LAT INDICATION:  shortness of breath bipolar COMPARISON:  None FINDINGS: PA and lateral radiographs of the chest demonstrate clear lungs.  The cardiac and mediastinal contours and pulmonary vascularity are normal.   There is a chronic left eighth posterior rib fracture. There is a moderate chronic compression fracture in the lower thoracic spine.     IMPRESSION: No acute process.       Side Effects: (reviewed/updated 04/06/2019)  None reported or admitted to.    Review of Systems: (reviewed/updated 04/06/2019)  Appetite: good  Sleep: good   All other Review of Systems: negative    Mental Status Exam:  Eye contact: Good eye contact  Psychomotor activity: relaxed  Speech is spontaneous  Thought process: Logical and goal directed   Mood is "ok"  Affect: constricted  Perception: No avh  Suicidal ideation: No si  Homicidal ideation: No hi  Insight/judgment: Poor  Cognition is grossly intact      Physical Exam:  Musculoskeletal system: steady gait  Tremor not present  Cog wheeling not present      Assessment and Plan:  [name] meets criteria for a diagnosis of Bipolar I disorder, current episode depressed with psychotic features.      Va level on the 27th.  Continue rest of medications as  prescribed.  We will closely monitor for safety.  We will encourage reality orientation.  Disposition planning to continue.       I certify that this patients inpatient psychiatric hospital services furnished since the previous certification were, and continue to be, required for treatment that could reasonably be expected to improve the patient's condition, or for diagnostic study, and that the patient continues to need, on a daily basis, active treatment furnished directly by or requiring the supervision of inpatient psychiatric facility personnel. In addition, the hospital records show that services furnished were intensive treatment services, admission or related services, or equivalent services.      Signed:  Su Hoff, NP  04/06/2019

## 2019-04-06 NOTE — Progress Notes (Signed)
Problem: Altered Thought Process (Adult/Pediatric)  Goal: *STG: Participates in treatment plan  Outcome: Progressing Towards Goal  Note: Out on unit passively engaged. Mood and affect brighter fuller range and stable. Denies SI, AH, decrease focus on delusions. Medication education is focus and dc planning  Goal: *STG: Seeks staff when feelings of anxiety and fear arise  Outcome: Progressing Towards Goal  Goal: *STG: Attends activities and groups  Outcome: Progressing Towards Goal  Goal: *STG: Decreased delusional thinking  Outcome: Progressing Towards Goal  Goal: *STG: Decreased hallucinations  Outcome: Progressing Towards Goal  Goal: *STG: Demonstrates ability to understand and use improved judgment in daily activities and relationships  Outcome: Progressing Towards Goal  Goal: Interventions  Outcome: Progressing Towards Goal

## 2019-04-07 LAB — GLUCOSE, POC
Glucose (POC): 118 mg/dL — ABNORMAL HIGH (ref 65–100)
Glucose (POC): 158 mg/dL — ABNORMAL HIGH (ref 65–100)

## 2019-04-07 LAB — POCT GLUCOSE
POC Glucose: 118 mg/dL — ABNORMAL HIGH (ref 65–100)
POC Glucose: 158 mg/dL — ABNORMAL HIGH (ref 65–100)

## 2019-04-07 MED FILL — RISPERIDONE 1 MG TAB: 1 mg | ORAL | Qty: 1

## 2019-04-07 MED FILL — DIVALPROEX 500 MG 24 HR TAB: 500 mg | ORAL | Qty: 2

## 2019-04-07 MED FILL — ATORVASTATIN 10 MG TAB: 10 mg | ORAL | Qty: 1

## 2019-04-07 MED FILL — CHILDREN'S ASPIRIN 81 MG CHEWABLE TABLET: 81 mg | ORAL | Qty: 1

## 2019-04-07 NOTE — Behavioral Health Treatment Team (Signed)
Group Start Time: 2000  Group End Time: 2045  Group Topic: Nursing        IP BH GROUP DOCUMENTATION GROUP     Group Therapy Note     Attendees: all           Attendance: Attended     Patient's Goal:  learning     Interventions/techniques: Informed     Follows Directions: Followed directions     Interactions: Interacted appropriately     Mental Status: Calm     Behavior/appearance: Cooperative     Goals Achieved: Able to engage in interactions        Additional Notes:  none

## 2019-04-07 NOTE — Progress Notes (Signed)
Problem: Altered Thought Process (Adult/Pediatric)  Goal: *SG: Participates in treatment plan  Outcome: Progressing Towards Goal  Mood is subdued.  Pt is able to concentrate on tasks with out interruption from internal stimuli.

## 2019-04-07 NOTE — Progress Notes (Signed)
Problem: Altered Thought Process (Adult/Pediatric)  Goal: *STG: Seeks staff when feelings of anxiety and fear arise  Outcome: Progressing Towards Goal  Note: Pt visible on the unit with little interactions with peers. Less focused on delusions. Eating his meals and taking medications as scheduled. Encouraged pt to participate on the unit.     Problem: Patient Education: Go to Patient Education Activity  Goal: Patient/Family Education  Outcome: Progressing Towards Goal     Problem: Falls - Risk of  Goal: *Absence of Falls  Description: Document Schmid Fall Risk and appropriate interventions in the flowsheet.  Outcome: Progressing Towards Goal  Note: Fall Risk Interventions:            Medication Interventions: Teach patient to arise slowly                   Problem: Patient Education: Go to Patient Education Activity  Goal: Patient/Family Education  Outcome: Progressing Towards Goal

## 2019-04-07 NOTE — Other (Addendum)
Behavioral Health Interdisciplinary Rounds     Patient Name: Ronald Saunders  Age: 78 y.o.  Room/Bed:  733/01  Primary Diagnosis: <principal problem not specified>   Admission Status: Voluntary     Readmission within 30 days: no  Power of Attorney in place: no  Patient requires a blocked bed: no          Reason for blocked bed:     VTE Prophylaxis: No    Mobility needs/Fall risk: no  Flu Vaccine : no   Nutritional Plan: no  Consults:          Labs/Testing due today?: no    Sleep hours:  7      Participation in Care/Groups:  yes  Medication Compliant?: Yes  PRNS (last 24 hours): None    Restraints (last 24 hours):  no     CIWA (range last 24 hours):     COWS (range last 24 hours):      Alcohol screening (AUDIT) completed -         If applicable, date SBIRT discussed in treatment team AND documented:   AUDIT Screen Score:        Document Brief Intervention (corresponds directly with the 5 A's, Ask, Advise, Assess, Assist, and Arrange):      At- Risk Patients (Score 7-15 for women; 8-15 for men)  Discuss concern patient is drinking at unhealthy levels known to increase risk of alcohol-related health problems.    Is Patient ready to commit to change?     If No:  ? Encourage reflection  ? Discuss short term and long term health risks of consuming alcohol  ? Barriers to change  ? Reaffirm willingness to help / Educational materials provided  If Yes:  ? Set goal  ? Plan  ? Educational materials provided    Harmful use or Dependence (Score 16 or greater)  ? Discuss short term and long term health risks of consuming alcohol  ? Recommendations  ? Negotiate drinking goal  ? Recommend addiction specialist/center  ? Arrange follow-up appointments.    Tobacco - patient is a smoker: Have You Used Tobacco in the Past 30 Days: No  Illegal Drugs use: Have You Used Any Illegal Substances Over the Past 12 Months: No    24 hour chart check complete: yes     Patient goal(s) for today:   Treatment team focus/goals:   Progress note      LOS:  5  Expected LOS:     Financial concerns/prescription coverage:    Family contact:       Family requesting physician contact today:    Discharge plan:   Access to weapons :         Outpatient provider(s):   Patient's preferred phone number for follow up call :     Participating treatment team members: [name], * (assigned SW),

## 2019-04-07 NOTE — Progress Notes (Signed)
PSYCHIATRIC PROGRESS NOTE       Patient: Ronald Saunders MRN: [id number]  SSN: [id number]    Date of Birth: 1941/05/12  Age: 78 y.o.  Sex: male      Admit Date: 04/02/2019    LOS: 5 days       Chief Complaint:  I am feeling better.    Interval History:  Ronald Saunders is calm and pleasant. Says he is looking forward in going home. Not as paranoid, no management issue. Denies si hi or avh. He is tolerating his meds and denies side effects. Sleeping and resting well. Continues to make progress.         Past Medical History:  No past medical history on file.      ALLERGIES:(reviewed/updated 04/07/2019)  No Known Allergies    Laboratory report:  Lab Results   Component Value Date/Time    WBC 7.5 04/01/2019 01:21 PM    HGB 14.9 04/01/2019 01:21 PM    HCT 41.9 04/01/2019 01:21 PM    PLATELET 212 04/01/2019 01:21 PM    MCV 87.7 04/01/2019 01:21 PM      Lab Results   Component Value Date/Time    Sodium 138 04/01/2019 01:21 PM    Potassium 4.1 04/01/2019 01:21 PM    Chloride 108 04/01/2019 01:21 PM    CO2 24 04/01/2019 01:21 PM    Anion gap 6 04/01/2019 01:21 PM    Glucose 130 (H) 04/01/2019 01:21 PM    BUN 15 04/01/2019 01:21 PM    Creatinine 1.22 04/01/2019 01:21 PM    BUN/Creatinine ratio 12 04/01/2019 01:21 PM    GFR est AA >60 04/01/2019 01:21 PM    GFR est non-AA 58 (L) 04/01/2019 01:21 PM    Calcium 9.2 04/01/2019 01:21 PM    Bilirubin, total 0.5 04/01/2019 01:21 PM    Alk. phosphatase 119 (H) 04/01/2019 01:21 PM    Protein, total 7.9 04/01/2019 01:21 PM    Albumin 3.8 04/01/2019 01:21 PM    Globulin 4.1 (H) 04/01/2019 01:21 PM    A-G Ratio 0.9 (L) 04/01/2019 01:21 PM    ALT (SGPT) 49 04/01/2019 01:21 PM      Vitals:    04/06/19 1540 04/06/19 2052 04/06/19 2323 04/07/19 0804   BP: 104/69 112/70  134/82   Pulse: 91 83  98   Resp: '18 16  16   '$ Temp: 97.4 ??F (36.3 ??C) 97.6 ??F (36.4 ??C)  97.4 ??F (36.3 ??C)   SpO2: 97% 97%  98%   Weight:       Height:   '5\' 2"'$  (1.575 m)         No results found for: VALF2, VALAC, VALP, VALPR, DS6, CRBAM, CRBAMP, CARB2, XCRBAM  No results found for: LITHM    Vital Signs  Patient Vitals for the past 24 hrs:   Temp Pulse Resp BP SpO2   04/07/19 0804 97.4 ??F (36.3 ??C) 98 16 134/82 98 %   04/06/19 2052 97.6 ??F (36.4 ??C) 83 16 112/70 97 %   04/06/19 1540 97.4 ??F (36.3 ??C) 91 18 104/69 97 %     Wt Readings from Last 3 Encounters:   04/05/19 63 kg (139 lb)   04/01/19 61.2 kg (135 lb)     Temp Readings from Last 3 Encounters:   04/07/19 97.4 ??F (36.3 ??C)   04/02/19 97.7 ??F (36.5 ??C)     BP Readings from Last 3 Encounters:   04/07/19 134/82   04/02/19 156/85  Pulse Readings from Last 3 Encounters:   04/07/19 98   04/02/19 71       Radiology (reviewed/updated 04/07/2019)  Xr Chest Pa Lat    Result Date: 04/01/2019  EXAM:  XR CHEST PA LAT INDICATION:  shortness of breath bipolar COMPARISON:  None FINDINGS: PA and lateral radiographs of the chest demonstrate clear lungs.  The cardiac and mediastinal contours and pulmonary vascularity are normal.   There is a chronic left eighth posterior rib fracture. There is a moderate chronic compression fracture in the lower thoracic spine.     IMPRESSION: No acute process.       Side Effects: (reviewed/updated 04/07/2019)  None reported or admitted to.    Review of Systems: (reviewed/updated 04/07/2019)  Appetite: good  Sleep: good   All other Review of Systems: negative    Mental Status Exam:  Eye contact: Good eye contact  Psychomotor activity: relaxed  Speech is spontaneous  Thought process: Logical and goal directed   Mood is "ok"  Affect: constricted  Perception: No avh  Suicidal ideation: No si  Homicidal ideation: No hi  Insight/judgment: Poor  Cognition is grossly intact      Physical Exam:  Musculoskeletal system: steady gait  Tremor not present  Cog wheeling not present      Assessment and Plan:  Ronald Saunders meets criteria for a diagnosis of Bipolar I disorder, current episode depressed with psychotic features.       Va level on the 27th.  Continue rest of medications as prescribed.  We will closely monitor for safety.  We will encourage reality orientation.  Disposition planning to continue.       I certify that this patients inpatient psychiatric hospital services furnished since the previous certification were, and continue to be, required for treatment that could reasonably be expected to improve the patient's condition, or for diagnostic study, and that the patient continues to need, on a daily basis, active treatment furnished directly by or requiring the supervision of inpatient psychiatric facility personnel. In addition, the hospital records show that services furnished were intensive treatment services, admission or related services, or equivalent services.      Signed:  Su Hoff, NP  04/07/2019

## 2019-04-07 NOTE — Progress Notes (Signed)
Pt appears to be sleeping. Respirations even and unlabored. NAD. Will continue to monitor q15 for safety.    Problem: Falls - Risk of  Goal: *Absence of Falls  Description: Document Schmid Fall Risk and appropriate interventions in the flowsheet.  Outcome: Progressing Towards Goal  Note: Fall Risk Interventions:            Medication Interventions: Teach patient to arise slowly

## 2019-04-07 NOTE — Progress Notes (Signed)
PSYCHIATRIC PROGRESS NOTE       Patient: Ronald Saunders MRN: [id number]  SSN: [id number]    Date of Birth: [birth year]  Age: 78 y.o.  Sex: male      Admit Date: 04/02/2019    LOS: 5 days       Chief Complaint:  I am feeling better.    Interval History:  Ronald Saunders is calm and pleasant. Says he is looking forward in going home. Not as paranoid, no management issue. Denies si hi or avh. He is tolerating his meds and denies side effects. Sleeping and resting well. Continues to make progress.         Past Medical History:  No past medical history on file.      ALLERGIES:(reviewed/updated 04/07/2019)  No Known Allergies    Laboratory report:  Lab Results   Component Value Date/Time    WBC 7.5 04/01/2019 01:21 PM    HGB 14.9 04/01/2019 01:21 PM    HCT 41.9 04/01/2019 01:21 PM    PLATELET 212 04/01/2019 01:21 PM    MCV 87.7 04/01/2019 01:21 PM      Lab Results   Component Value Date/Time    Sodium 138 04/01/2019 01:21 PM    Potassium 4.1 04/01/2019 01:21 PM    Chloride 108 04/01/2019 01:21 PM    CO2 24 04/01/2019 01:21 PM    Anion gap 6 04/01/2019 01:21 PM    Glucose 130 (H) 04/01/2019 01:21 PM    BUN 15 04/01/2019 01:21 PM    Creatinine 1.22 04/01/2019 01:21 PM    BUN/Creatinine ratio 12 04/01/2019 01:21 PM    GFR est AA >60 04/01/2019 01:21 PM    GFR est non-AA 58 (L) 04/01/2019 01:21 PM    Calcium 9.2 04/01/2019 01:21 PM    Bilirubin, total 0.5 04/01/2019 01:21 PM    Alk. phosphatase 119 (H) 04/01/2019 01:21 PM    Protein, total 7.9 04/01/2019 01:21 PM    Albumin 3.8 04/01/2019 01:21 PM    Globulin 4.1 (H) 04/01/2019 01:21 PM    A-G Ratio 0.9 (L) 04/01/2019 01:21 PM    ALT (SGPT) 49 04/01/2019 01:21 PM      Vitals:    04/06/19 1540 04/06/19 2052 04/06/19 2323 04/07/19 0804   BP: 104/69 112/70  134/82   Pulse: 91 83  98   Resp: _0 Temp: 97.4 ??F (36.3 ??C) 97.6 ??F (36.4 ??C)  97.4 ??F (36.3 ??C)   SpO2: 97% 97%  98%   Weight:       Height:   5' 2" (1.575 m)        No results found for: VALF2, VALAC, VALP, VALPR,  DS6, CRBAM, CRBAMP, CARB2, XCRBAM  No results found for: LITHM    Vital Signs  Patient Vitals for the past 24 hrs:   Temp Pulse Resp BP SpO2   04/07/19 0804 97.4 ??F (36.3 ??C) 98 16 134/82 98 %   04/06/19 2052 97.6 ??F (36.4 ??C) 83 16 112/70 97 %   04/06/19 1540 97.4 ??F (36.3 ??C) 91 18 104/69 97 %     Wt Readings from Last 3 Encounters:   04/05/19 63 kg (139 lb)   04/01/19 61.2 kg (135 lb)     Temp Readings from Last 3 Encounters:   04/07/19 97.4 ??F (36.3 ??C)   04/02/19 97.7 ??F (36.5 ??C)     BP Readings from Last 3 Encounters:   04/07/19 134/82   04/02/19 156/85  Pulse Readings from Last 3 Encounters:   04/07/19 98   04/02/19 71       Radiology (reviewed/updated 04/07/2019)  Xr Chest Pa Lat    Result Date: 04/01/2019  EXAM:  XR CHEST PA LAT INDICATION:  shortness of breath bipolar COMPARISON:  None FINDINGS: PA and lateral radiographs of the chest demonstrate clear lungs.  The cardiac and mediastinal contours and pulmonary vascularity are normal.   There is a chronic left eighth posterior rib fracture. There is a moderate chronic compression fracture in the lower thoracic spine.     IMPRESSION: No acute process.       Side Effects: (reviewed/updated 04/07/2019)  None reported or admitted to.    Review of Systems: (reviewed/updated 04/07/2019)  Appetite: good  Sleep: good   All other Review of Systems: negative    Mental Status Exam:  Eye contact: Good eye contact  Psychomotor activity: relaxed  Speech is spontaneous  Thought process: Logical and goal directed   Mood is "ok"  Affect: constricted  Perception: No avh  Suicidal ideation: No si  Homicidal ideation: No hi  Insight/judgment: Poor  Cognition is grossly intact      Physical Exam:  Musculoskeletal system: steady gait  Tremor not present  Cog wheeling not present      Assessment and Plan:  Ronald Saunders meets criteria for a diagnosis of Bipolar I disorder, current episode depressed with psychotic features.      Va level on the 27th.  Continue rest of  medications as prescribed.  We will closely monitor for safety.  We will encourage reality orientation.  Disposition planning to continue.       I certify that this patients inpatient psychiatric hospital services furnished since the previous certification were, and continue to be, required for treatment that could reasonably be expected to improve the patient's condition, or for diagnostic study, and that the patient continues to need, on a daily basis, active treatment furnished directly by or requiring the supervision of inpatient psychiatric facility personnel. In addition, the hospital records show that services furnished were intensive treatment services, admission or related services, or equivalent services.      Signed:  Su Hoff, NP  04/07/2019

## 2019-04-07 NOTE — Progress Notes (Signed)
Pt appears to be sleeping. Respirations even and unlabored. NAD. Will continue to monitor q15 for safety.    Problem: Falls - Risk of  Goal: *Absence of Falls  Description: Document Ronald Saunders Fall Risk and appropriate interventions in the flowsheet.  Outcome: Progressing Towards Goal  Note: Fall Risk Interventions:            Medication Interventions: Teach patient to arise slowly

## 2019-04-07 NOTE — Progress Notes (Signed)
Problem: Altered Thought Process (Adult/Pediatric)  Goal: *STG: Seeks staff when feelings of anxiety and fear arise  Outcome: Progressing Towards Goal  Note: Pt visible on the unit with little interactions with peers. Less focused on delusions. Eating his meals and taking medications as scheduled. Encouraged pt to participate on the unit.     Problem: Patient Education: Go to Patient Education Activity  Goal: Patient/Family Education  Outcome: Progressing Towards Goal     Problem: Falls - Risk of  Goal: *Absence of Falls  Description: Document Bridgette Habermann Fall Risk and appropriate interventions in the flowsheet.  Outcome: Progressing Towards Goal  Note: Fall Risk Interventions:            Medication Interventions: Teach patient to arise slowly                   Problem: Patient Education: Go to Patient Education Activity  Goal: Patient/Family Education  Outcome: Progressing Towards Goal

## 2019-04-07 NOTE — Behavioral Health Treatment Team (Signed)
Group Start Time: 2000  Group End Time: 2045  Group Topic: Nursing        IP BH GROUP DOCUMENTATION GROUP     Group Therapy Note     Attendees: all           Attendance: Attended     Patient's Goal:  learning     Interventions/techniques: Informed     Follows Directions: Followed directions     Interactions: Interacted appropriately     Mental Status: Calm     Behavior/appearance: Cooperative     Goals Achieved: Able to engage in interactions        Additional Notes:  none

## 2019-04-07 NOTE — Progress Notes (Signed)
Problem: Altered Thought Process (Adult/Pediatric)  Goal: *SG: Participates in treatment plan  Outcome: Progressing Towards Goal  Mood is subdued.  Pt is able to concentrate on tasks with out interruption from internal stimuli.

## 2019-04-08 LAB — VALPROIC ACID
Valproic Acid: 60 ug/ml (ref 50–100)
Valproic acid: 60 ug/ml (ref 50–100)

## 2019-04-08 LAB — HEMOGLOBIN A1C WITH EAG
Est. average glucose: 140 mg/dL
Hemoglobin A1c: 6.5 % — ABNORMAL HIGH (ref 4.0–5.6)

## 2019-04-08 LAB — LIPID PANEL
CHOL/HDL Ratio: 6 — ABNORMAL HIGH (ref 0.0–5.0)
Chol/HDL Ratio: 6 — ABNORMAL HIGH (ref 0.0–5.0)
Cholesterol, Total: 229 MG/DL — ABNORMAL HIGH (ref <200)
Cholesterol, total: 229 MG/DL — ABNORMAL HIGH (ref <200)
HDL Cholesterol: 38 MG/DL
HDL: 38 MG/DL
LDL Calculated: ELEVATED MG/DL (ref 0–100)
LDL, calculated: ELEVATED MG/DL (ref 0–100)
Triglyceride: 630 MG/DL — ABNORMAL HIGH (ref <150)
Triglycerides: 630 MG/DL — ABNORMAL HIGH (ref <150)

## 2019-04-08 LAB — GLUCOSE, POC
Glucose (POC): 116 mg/dL — ABNORMAL HIGH (ref 65–100)
Glucose (POC): 153 mg/dL — ABNORMAL HIGH (ref 65–100)

## 2019-04-08 LAB — LDL, DIRECT: LDL,Direct: 111 mg/dl — ABNORMAL HIGH (ref 0–100)

## 2019-04-08 LAB — POCT GLUCOSE
POC Glucose: 116 mg/dL — ABNORMAL HIGH (ref 65–100)
POC Glucose: 153 mg/dL — ABNORMAL HIGH (ref 65–100)

## 2019-04-08 LAB — LDL CHOLESTEROL, DIRECT: LDL Direct: 111 mg/dl — ABNORMAL HIGH (ref 0–100)

## 2019-04-08 LAB — HEMOGLOBIN A1C W/EAG
Hemoglobin A1C: 6.5 % — ABNORMAL HIGH (ref 4.0–5.6)
eAG: 140 mg/dL

## 2019-04-08 MED ORDER — ATORVASTATIN 40 MG TAB
40 mg | Freq: Every evening | ORAL | Status: DC
Start: 2019-04-08 — End: 2019-04-10
  Administered 2019-04-09 – 2019-04-10 (×2): via ORAL

## 2019-04-08 MED FILL — DIVALPROEX 500 MG 24 HR TAB: 500 mg | ORAL | Qty: 2

## 2019-04-08 MED FILL — RISPERIDONE 1 MG TAB: 1 mg | ORAL | Qty: 1

## 2019-04-08 MED FILL — ATORVASTATIN 10 MG TAB: 10 mg | ORAL | Qty: 1

## 2019-04-08 MED FILL — CHILDREN'S ASPIRIN 81 MG CHEWABLE TABLET: 81 mg | ORAL | Qty: 1

## 2019-04-08 NOTE — Progress Notes (Signed)
PSYCHIATRIC PROGRESS NOTE       Patient: Ronald Saunders MRN: [id number]  SSN: [id number]    Date of Birth: 1941-05-09  Age: 78 y.o.  Sex: male      Admit Date: 04/02/2019    LOS: 6 days       Chief Complaint:  I am doing better than yesterday.    Interval History:  Ronald Saunders is calm and pleasant. Feels he is making a lot of progress. Denies si hi or avh. No paranoia or delusions noted. Trig-630 and Va level-60. He is not showing any manic symptoms, no impulsivity, been appropriate in the unit.  He is tolerating his meds and denies side effects. Sleeping and resting well.          Past Medical History:  No past medical history on file.      ALLERGIES:(reviewed/updated 04/08/2019)  No Known Allergies    Laboratory report:  Lab Results   Component Value Date/Time    WBC 7.5 04/01/2019 01:21 PM    HGB 14.9 04/01/2019 01:21 PM    HCT 41.9 04/01/2019 01:21 PM    PLATELET 212 04/01/2019 01:21 PM    MCV 87.7 04/01/2019 01:21 PM      Lab Results   Component Value Date/Time    Sodium 138 04/01/2019 01:21 PM    Potassium 4.1 04/01/2019 01:21 PM    Chloride 108 04/01/2019 01:21 PM    CO2 24 04/01/2019 01:21 PM    Anion gap 6 04/01/2019 01:21 PM    Glucose 130 (H) 04/01/2019 01:21 PM    BUN 15 04/01/2019 01:21 PM    Creatinine 1.22 04/01/2019 01:21 PM    BUN/Creatinine ratio 12 04/01/2019 01:21 PM    GFR est AA >60 04/01/2019 01:21 PM    GFR est non-AA 58 (L) 04/01/2019 01:21 PM    Calcium 9.2 04/01/2019 01:21 PM    Bilirubin, total 0.5 04/01/2019 01:21 PM    Alk. phosphatase 119 (H) 04/01/2019 01:21 PM    Protein, total 7.9 04/01/2019 01:21 PM    Albumin 3.8 04/01/2019 01:21 PM    Globulin 4.1 (H) 04/01/2019 01:21 PM    A-G Ratio 0.9 (L) 04/01/2019 01:21 PM    ALT (SGPT) 49 04/01/2019 01:21 PM      Vitals:    04/07/19 1537 04/07/19 1941 04/08/19 0752 04/08/19 0810   BP: 153/76 139/78 116/79    Pulse: 88 93 90    Resp: '16 16 16    '$ Temp: 97.5 ??F (36.4 ??C) 97.2 ??F (36.2 ??C) 97.9 ??F (36.6 ??C)    SpO2: 98% 98% 95%     Weight:    64.2 kg (141 lb 9.6 oz)   Height:           Lab Results   Component Value Date/Time    Valproic acid 60 04/07/2019 07:05 PM     No results found for: LITHM    Vital Signs  Patient Vitals for the past 24 hrs:   Temp Pulse Resp BP SpO2   04/08/19 0752 97.9 ??F (36.6 ??C) 90 16 116/79 95 %   04/07/19 1941 97.2 ??F (36.2 ??C) 93 16 139/78 98 %   04/07/19 1537 97.5 ??F (36.4 ??C) 88 16 153/76 98 %     Wt Readings from Last 3 Encounters:   04/08/19 64.2 kg (141 lb 9.6 oz)   04/01/19 61.2 kg (135 lb)     Temp Readings from Last 3 Encounters:   04/08/19 97.9 ??F (36.6 ??C)  04/02/19 97.7 ??F (36.5 ??C)     BP Readings from Last 3 Encounters:   04/08/19 116/79   04/02/19 156/85     Pulse Readings from Last 3 Encounters:   04/08/19 90   04/02/19 71       Radiology (reviewed/updated 04/08/2019)  Xr Chest Pa Lat    Result Date: 04/01/2019  EXAM:  XR CHEST PA LAT INDICATION:  shortness of breath bipolar COMPARISON:  None FINDINGS: PA and lateral radiographs of the chest demonstrate clear lungs.  The cardiac and mediastinal contours and pulmonary vascularity are normal.   There is a chronic left eighth posterior rib fracture. There is a moderate chronic compression fracture in the lower thoracic spine.     IMPRESSION: No acute process.       Side Effects: (reviewed/updated 04/08/2019)  None reported or admitted to.    Review of Systems: (reviewed/updated 04/08/2019)  Appetite: good  Sleep: good   All other Review of Systems: negative    Mental Status Exam:  Eye contact: Good eye contact  Psychomotor activity: relaxed  Speech is spontaneous  Thought process: Logical and goal directed   Mood is "ok"  Affect: full  Perception: No avh  Suicidal ideation: No si  Homicidal ideation: No hi  Insight/judgment: Poor  Cognition is grossly intact      Physical Exam:  Musculoskeletal system: steady gait  Tremor not present  Cog wheeling not present      Assessment and Plan:  Ronald Saunders meets criteria for a diagnosis of Bipolar I disorder,  current episode depressed with psychotic features.      Increase lipitor to '40mg'$ ..  Continue rest of medications as prescribed.  We will closely monitor for safety.  We will encourage reality orientation.  Disposition planning to continue.       I certify that this patients inpatient psychiatric hospital services furnished since the previous certification were, and continue to be, required for treatment that could reasonably be expected to improve the patient's condition, or for diagnostic study, and that the patient continues to need, on a daily basis, active treatment furnished directly by or requiring the supervision of inpatient psychiatric facility personnel. In addition, the hospital records show that services furnished were intensive treatment services, admission or related services, or equivalent services.      Signed:  Su Hoff, NP  04/08/2019

## 2019-04-08 NOTE — Progress Notes (Signed)
Pt appears to be asleep. NAD. Respirations WNL. Will continue to monitor q15 for safety.    Problem: Falls - Risk of  Goal: *Absence of Falls  Description: Document Schmid Fall Risk and appropriate interventions in the flowsheet.  Outcome: Progressing Towards Goal  Note: Fall Risk Interventions:            Medication Interventions: Teach patient to arise slowly

## 2019-04-08 NOTE — Other (Addendum)
Behavioral Health Interdisciplinary Rounds     Patient Name: Ronald Saunders  Age: 78 y.o.  Room/Bed:  733/01  Primary Diagnosis: <principal problem not specified>   Admission Status: Voluntary     Readmission within 30 days: no  Power of Attorney in place: no  Patient requires a blocked bed: no          Reason for blocked bed:     VTE Prophylaxis: No    Mobility needs/Fall risk: no  Flu Vaccine : no   Nutritional Plan: no  Consults:          Labs/Testing due today?: no    Sleep hours: 7       Participation in Care/Groups:  yes  Medication Compliant?: Yes  PRNS (last 24 hours): None    Restraints (last 24 hours):  no     CIWA (range last 24 hours):     COWS (range last 24 hours):      Alcohol screening (AUDIT) completed -   AUDIT Score: 0     If applicable, date SBIRT discussed in treatment team AND documented:   AUDIT Screen Score: AUDIT Score: 0      Document Brief Intervention (corresponds directly with the 5 A's, Ask, Advise, Assess, Assist, and Arrange):      At- Risk Patients (Score 7-15 for women; 8-15 for men)  Discuss concern patient is drinking at unhealthy levels known to increase risk of alcohol-related health problems.    Is Patient ready to commit to change?     If No:  ? Encourage reflection  ? Discuss short term and long term health risks of consuming alcohol  ? Barriers to change  ? Reaffirm willingness to help / Educational materials provided  If Yes:  ? Set goal  ? Plan  ? Educational materials provided    Harmful use or Dependence (Score 16 or greater)  ? Discuss short term and long term health risks of consuming alcohol  ? Recommendations  ? Negotiate drinking goal  ? Recommend addiction specialist/center  ? Arrange follow-up appointments.    Tobacco - patient is a smoker: Have You Used Tobacco in the Past 30 Days: No  Illegal Drugs use: Have You Used Any Illegal Substances Over the Past 12 Months: No    24 hour chart check complete: yes     Patient goal(s) for today:    Treatment team focus/goals:   Progress note     LOS:  6  Expected LOS:     Financial concerns/prescription coverage:    Family contact:       Family requesting physician contact today:    Discharge plan:   Access to weapons :         Outpatient provider(s):   Patient's preferred phone number for follow up call :     Participating treatment team members: [name], * (assigned SW),

## 2019-04-08 NOTE — Group Note (Cosign Needed)
IP [facility] GROUP DOCUMENTATION INDIVIDUAL                                                                          Group Therapy Note    Date: 04/08/2019    Group Start Time: 1310  Group End Time: 1415  Group Topic: Topic Group    [facility]    Sasala, Anna    IP [facility] GROUP DOCUMENTATION GROUP    Group Therapy Note    Attendees: 4/7         Attendance: Did not attend      Anna Sasala

## 2019-04-08 NOTE — Progress Notes (Signed)
Problem: Altered Thought Process (Adult/Pediatric)  Goal: *STG: Participates in treatment plan  Outcome: Progressing Towards Goal  Note: Pt participated in treatment team. Visible on the unit for small periods of time. Reviewed labs during treatment team. Denies SI/HI and less paranoid. Encouraged pt to participate on the unit.   Goal: Interventions  Outcome: Progressing Towards Goal     Problem: Patient Education: Go to Patient Education Activity  Goal: Patient/Family Education  Outcome: Progressing Towards Goal     Problem: Falls - Risk of  Goal: *Absence of Falls  Description: Document Schmid Fall Risk and appropriate interventions in the flowsheet.  Outcome: Progressing Towards Goal  Note: Fall Risk Interventions:            Medication Interventions: Teach patient to arise slowly                   Problem: Patient Education: Go to Patient Education Activity  Goal: Patient/Family Education  Outcome: Progressing Towards Goal

## 2019-04-08 NOTE — Progress Notes (Addendum)
Problem: Altered Thought Process (Adult/Pediatric)  Goal: *STG: Participates in treatment plan  Outcome: Progressing Towards Goal  Goal: *STG: Seeks staff when feelings of anxiety and fear arise  Outcome: Progressing Towards Goal  Goal: *STG: Attends activities and groups  Outcome: Not Progressing Towards Goal     Problem: Altered Thought Process (Adult/Pediatric)  Goal: *STG: Decreased delusional thinking  Outcome: Not Progressing Towards Goal    Visible on the unit for small periods of time. Mood is Euthymic. Affect is dull. Will continue to monitor. Encouraged pt to participate more on the unit.

## 2019-04-08 NOTE — Progress Notes (Signed)
PSYCHIATRIC PROGRESS NOTE       Patient: Ronald Saunders MRN: [id number]  SSN: [id number]    Date of Birth: August 23, 1941  Age: 78 y.o.  Sex: male      Admit Date: 04/02/2019    LOS: 6 days       Chief Complaint:  I am doing better than yesterday.    Interval History:  Ronald Saunders is calm and pleasant. Feels he is making a lot of progress. Denies si hi or avh. No paranoia or delusions noted. Trig-630 and Va level-60. He is not showing any manic symptoms, no impulsivity, been appropriate in the unit.  He is tolerating his meds and denies side effects. Sleeping and resting well.          Past Medical History:  No past medical history on file.      ALLERGIES:(reviewed/updated 04/08/2019)  No Known Allergies    Laboratory report:  Lab Results   Component Value Date/Time    WBC 7.5 04/01/2019 01:21 PM    HGB 14.9 04/01/2019 01:21 PM    HCT 41.9 04/01/2019 01:21 PM    PLATELET 212 04/01/2019 01:21 PM    MCV 87.7 04/01/2019 01:21 PM      Lab Results   Component Value Date/Time    Sodium 138 04/01/2019 01:21 PM    Potassium 4.1 04/01/2019 01:21 PM    Chloride 108 04/01/2019 01:21 PM    CO2 24 04/01/2019 01:21 PM    Anion gap 6 04/01/2019 01:21 PM    Glucose 130 (H) 04/01/2019 01:21 PM    BUN 15 04/01/2019 01:21 PM    Creatinine 1.22 04/01/2019 01:21 PM    BUN/Creatinine ratio 12 04/01/2019 01:21 PM    GFR est AA >60 04/01/2019 01:21 PM    GFR est non-AA 58 (L) 04/01/2019 01:21 PM    Calcium 9.2 04/01/2019 01:21 PM    Bilirubin, total 0.5 04/01/2019 01:21 PM    Alk. phosphatase 119 (H) 04/01/2019 01:21 PM    Protein, total 7.9 04/01/2019 01:21 PM    Albumin 3.8 04/01/2019 01:21 PM    Globulin 4.1 (H) 04/01/2019 01:21 PM    A-G Ratio 0.9 (L) 04/01/2019 01:21 PM    ALT (SGPT) 49 04/01/2019 01:21 PM      Vitals:    04/07/19 1537 04/07/19 1941 04/08/19 0752 04/08/19 0810   BP: 153/76 139/78 116/79    Pulse: 88 93 90    Resp: '16 16 16    ' Temp: 97.5 ??F (36.4 ??C) 97.2 ??F (36.2 ??C) 97.9 ??F (36.6 ??C)    SpO2: 98% 98% 95%    Weight:     64.2 kg (141 lb 9.6 oz)   Height:           Lab Results   Component Value Date/Time    Valproic acid 60 04/07/2019 07:05 PM     No results found for: LITHM    Vital Signs  Patient Vitals for the past 24 hrs:   Temp Pulse Resp BP SpO2   04/08/19 0752 97.9 ??F (36.6 ??C) 90 16 116/79 95 %   04/07/19 1941 97.2 ??F (36.2 ??C) 93 16 139/78 98 %   04/07/19 1537 97.5 ??F (36.4 ??C) 88 16 153/76 98 %     Wt Readings from Last 3 Encounters:   04/08/19 64.2 kg (141 lb 9.6 oz)   04/01/19 61.2 kg (135 lb)     Temp Readings from Last 3 Encounters:   04/08/19 97.9 ??F (36.6 ??C)  04/02/19 97.7 ??F (36.5 ??C)     BP Readings from Last 3 Encounters:   04/08/19 116/79   04/02/19 156/85     Pulse Readings from Last 3 Encounters:   04/08/19 90   04/02/19 71       Radiology (reviewed/updated 04/08/2019)  Xr Chest Pa Lat    Result Date: 04/01/2019  EXAM:  XR CHEST PA LAT INDICATION:  shortness of breath bipolar COMPARISON:  None FINDINGS: PA and lateral radiographs of the chest demonstrate clear lungs.  The cardiac and mediastinal contours and pulmonary vascularity are normal.   There is a chronic left eighth posterior rib fracture. There is a moderate chronic compression fracture in the lower thoracic spine.     IMPRESSION: No acute process.       Side Effects: (reviewed/updated 04/08/2019)  None reported or admitted to.    Review of Systems: (reviewed/updated 04/08/2019)  Appetite: good  Sleep: good   All other Review of Systems: negative    Mental Status Exam:  Eye contact: Good eye contact  Psychomotor activity: relaxed  Speech is spontaneous  Thought process: Logical and goal directed   Mood is "ok"  Affect: full  Perception: No avh  Suicidal ideation: No si  Homicidal ideation: No hi  Insight/judgment: Poor  Cognition is grossly intact      Physical Exam:  Musculoskeletal system: steady gait  Tremor not present  Cog wheeling not present      Assessment and Plan:  Ronald Saunders meets criteria for a diagnosis of Bipolar I disorder, current  episode depressed with psychotic features.      Increase lipitor to 29m..[name] Continue rest of medications as prescribed.  We will closely monitor for safety.  We will encourage reality orientation.  Disposition planning to continue.       I certify that this patients inpatient psychiatric hospital services furnished since the previous certification were, and continue to be, required for treatment that could reasonably be expected to improve the patient's condition, or for diagnostic study, and that the patient continues to need, on a daily basis, active treatment furnished directly by or requiring the supervision of inpatient psychiatric facility personnel. In addition, the hospital records show that services furnished were intensive treatment services, admission or related services, or equivalent services.      Signed:  SSu Hoff NP  04/08/2019

## 2019-04-08 NOTE — Progress Notes (Signed)
Problem: Altered Thought Process (Adult/Pediatric)  Goal: *STG: Participates in treatment plan  Outcome: Progressing Towards Goal  Goal: *STG: Seeks staff when feelings of anxiety and fear arise  Outcome: Progressing Towards Goal  Goal: *STG: Attends activities and groups  Outcome: Not Progressing Towards Goal     Problem: Altered Thought Process (Adult/Pediatric)  Goal: *STG: Decreased delusional thinking  Outcome: Not Progressing Towards Goal    Visible on the unit for small periods of time. Mood is Euthymic. Affect is dull. Will continue to monitor. Encouraged pt to participate more on the unit.

## 2019-04-08 NOTE — Progress Notes (Signed)
Problem: Altered Thought Process (Adult/Pediatric)  Goal: *STG: Participates in treatment plan  Outcome: Progressing Towards Goal  Note: Pt participated in treatment team. Visible on the unit for small periods of time. Reviewed labs during treatment team. Denies SI/HI and less paranoid. Encouraged pt to participate on the unit.   Goal: Interventions  Outcome: Progressing Towards Goal     Problem: Patient Education: Go to Patient Education Activity  Goal: Patient/Family Education  Outcome: Progressing Towards Goal     Problem: Falls - Risk of  Goal: *Absence of Falls  Description: Document Bridgette Habermann Fall Risk and appropriate interventions in the flowsheet.  Outcome: Progressing Towards Goal  Note: Fall Risk Interventions:            Medication Interventions: Teach patient to arise slowly                   Problem: Patient Education: Go to Patient Education Activity  Goal: Patient/Family Education  Outcome: Progressing Towards Goal

## 2019-04-09 LAB — GLUCOSE, POC
Glucose (POC): 142 mg/dL — ABNORMAL HIGH (ref 65–100)
Glucose (POC): 148 mg/dL — ABNORMAL HIGH (ref 65–100)

## 2019-04-09 LAB — POCT GLUCOSE
POC Glucose: 142 mg/dL — ABNORMAL HIGH (ref 65–100)
POC Glucose: 148 mg/dL — ABNORMAL HIGH (ref 65–100)

## 2019-04-09 MED FILL — DIVALPROEX 500 MG 24 HR TAB: 500 mg | ORAL | Qty: 2

## 2019-04-09 MED FILL — ATORVASTATIN 40 MG TAB: 40 mg | ORAL | Qty: 1

## 2019-04-09 MED FILL — RISPERIDONE 1 MG TAB: 1 mg | ORAL | Qty: 1

## 2019-04-09 MED FILL — CHILDREN'S ASPIRIN 81 MG CHEWABLE TABLET: 81 mg | ORAL | Qty: 1

## 2019-04-09 NOTE — Progress Notes (Signed)
PSYCHIATRIC PROGRESS NOTE       Patient: Ronald Saunders MRN: [id number]  SSN: [id number]    Date of Birth: Feb 15, 1941  Age: 78 y.o.  Sex: male      Admit Date: 04/02/2019    LOS: 7 days       Chief Complaint:  I am doing better.    Interval History:  Ronald Saunders is calm and pleasant. Denies si hi or avh. No paranoia or delusions noted. He is not showing any manic symptoms, no impulsivity, been appropriate in the unit.  He is tolerating his meds and denies side effects. Sleeping and resting well.          Past Medical History:  No past medical history on file.      ALLERGIES:(reviewed/updated 04/09/2019)  No Known Allergies    Laboratory report:  Lab Results   Component Value Date/Time    WBC 7.5 04/01/2019 01:21 PM    HGB 14.9 04/01/2019 01:21 PM    HCT 41.9 04/01/2019 01:21 PM    PLATELET 212 04/01/2019 01:21 PM    MCV 87.7 04/01/2019 01:21 PM      Lab Results   Component Value Date/Time    Sodium 138 04/01/2019 01:21 PM    Potassium 4.1 04/01/2019 01:21 PM    Chloride 108 04/01/2019 01:21 PM    CO2 24 04/01/2019 01:21 PM    Anion gap 6 04/01/2019 01:21 PM    Glucose 130 (H) 04/01/2019 01:21 PM    BUN 15 04/01/2019 01:21 PM    Creatinine 1.22 04/01/2019 01:21 PM    BUN/Creatinine ratio 12 04/01/2019 01:21 PM    GFR est AA >60 04/01/2019 01:21 PM    GFR est non-AA 58 (L) 04/01/2019 01:21 PM    Calcium 9.2 04/01/2019 01:21 PM    Bilirubin, total 0.5 04/01/2019 01:21 PM    Alk. phosphatase 119 (H) 04/01/2019 01:21 PM    Protein, total 7.9 04/01/2019 01:21 PM    Albumin 3.8 04/01/2019 01:21 PM    Globulin 4.1 (H) 04/01/2019 01:21 PM    A-G Ratio 0.9 (L) 04/01/2019 01:21 PM    ALT (SGPT) 49 04/01/2019 01:21 PM      Vitals:    04/08/19 0810 04/08/19 1625 04/08/19 2031 04/09/19 0821   BP:  124/74 149/83 131/68   Pulse:  84 80 89   Resp:  '16 16 16   '$ Temp:  97.7 ??F (36.5 ??C) 97.4 ??F (36.3 ??C) 97.5 ??F (36.4 ??C)   SpO2:  96% 96% 96%   Weight: 64.2 kg (141 lb 9.6 oz)      Height:           Lab Results    Component Value Date/Time    Valproic acid 60 04/07/2019 07:05 PM     No results found for: LITHM    Vital Signs  Patient Vitals for the past 24 hrs:   Temp Pulse Resp BP SpO2   04/09/19 0821 97.5 ??F (36.4 ??C) 89 16 131/68 96 %   04/08/19 2031 97.4 ??F (36.3 ??C) 80 16 149/83 96 %   04/08/19 1625 97.7 ??F (36.5 ??C) 84 16 124/74 96 %     Wt Readings from Last 3 Encounters:   04/08/19 64.2 kg (141 lb 9.6 oz)   04/01/19 61.2 kg (135 lb)     Temp Readings from Last 3 Encounters:   04/09/19 97.5 ??F (36.4 ??C)   04/02/19 97.7 ??F (36.5 ??C)     BP Readings from Last  3 Encounters:   04/09/19 131/68   04/02/19 156/85     Pulse Readings from Last 3 Encounters:   04/09/19 89   04/02/19 71       Radiology (reviewed/updated 04/09/2019)  Xr Chest Pa Lat    Result Date: 04/01/2019  EXAM:  XR CHEST PA LAT INDICATION:  shortness of breath bipolar COMPARISON:  None FINDINGS: PA and lateral radiographs of the chest demonstrate clear lungs.  The cardiac and mediastinal contours and pulmonary vascularity are normal.   There is a chronic left eighth posterior rib fracture. There is a moderate chronic compression fracture in the lower thoracic spine.     IMPRESSION: No acute process.       Side Effects: (reviewed/updated 04/09/2019)  None reported or admitted to.    Review of Systems: (reviewed/updated 04/09/2019)  Appetite: good  Sleep: good   All other Review of Systems: negative    Mental Status Exam:  Eye contact: Good eye contact  Psychomotor activity: relaxed  Speech is spontaneous  Thought process: Logical and goal directed   Mood is "ok"  Affect: full  Perception: No avh  Suicidal ideation: No si  Homicidal ideation: No hi  Insight/judgment: Poor  Cognition is grossly intact      Physical Exam:  Musculoskeletal system: steady gait  Tremor not present  Cog wheeling not present      Assessment and Plan:  Ronald Saunders meets criteria for a diagnosis of Bipolar I disorder, current episode depressed with psychotic features.       Continue current medications as prescribed.  We will closely monitor for safety.  We will encourage reality orientation.  Plan for dc in am.      I certify that this patients inpatient psychiatric hospital services furnished since the previous certification were, and continue to be, required for treatment that could reasonably be expected to improve the patient's condition, or for diagnostic study, and that the patient continues to need, on a daily basis, active treatment furnished directly by or requiring the supervision of inpatient psychiatric facility personnel. In addition, the hospital records show that services furnished were intensive treatment services, admission or related services, or equivalent services.      Signed:  Su Hoff, NP  04/09/2019

## 2019-04-09 NOTE — Other (Addendum)
Behavioral Health Interdisciplinary Rounds     Patient Name: Ronald Saunders  Age: 78 y.o.  Room/Bed:  733/01  Primary Diagnosis: <principal problem not specified>   Admission Status: Voluntary     Readmission within 30 days: no  Power of Attorney in place: no  Patient requires a blocked bed: no          Reason for blocked bed:     VTE Prophylaxis: No    Mobility needs/Fall risk: no  Flu Vaccine : no   Nutritional Plan: no  Consults:          Labs/Testing due today?: no    Sleep hours:  8      Participation in Care/Groups:  yes  Medication Compliant?: Yes  PRNS (last 24 hours): None    Restraints (last 24 hours):  no     CIWA (range last 24 hours):     COWS (range last 24 hours):      Alcohol screening (AUDIT) completed -   AUDIT Score: 0     If applicable, date SBIRT discussed in treatment team AND documented:   AUDIT Screen Score: AUDIT Score: 0      Tobacco - patient is a smoker: Have You Used Tobacco in the Past 30 Days: No  Illegal Drugs use: Have You Used Any Illegal Substances Over the Past 12 Months: No    24 hour chart check complete: yes     Patient goal(s) for today: attend groups, take medications, talk to sister  Treatment team focus/goals: titrate medication, coordinate follow up  Progress note: Pt is alert, oriented and compliant with treatment, Denies SI.  Plan for discharge tomorrow  Family contact:??sister [name] [phone])  Patient's preferred phone number for follow up call :??[phone]??    LOS:  7  Expected LOS: 8  ????  Participating treatment team members:??[name],??Steffanie Epstein, NP;??A. Smith,??PharmD??Winfred Leeds, RN; Lenard Simmer, MSW

## 2019-04-09 NOTE — Progress Notes (Signed)
Problem: Falls - Risk of  Goal: *Absence of Falls  Description: Document Schmid Fall Risk and appropriate interventions in the flowsheet.  Outcome: Progressing Towards Goal  Note: Fall Risk Interventions:            Medication Interventions: Teach patient to arise slowly    Pt remains free from falls. Pt out in milieu for small periods of time. Returns briefly back to room following dinner time. No acute distress noted at this time. Staff will continue to monitor q 15 min checks.

## 2019-04-09 NOTE — Progress Notes (Signed)
Problem: Altered Thought Process (Adult/Pediatric)  Goal: *STG: Participates in treatment plan  Outcome: Progressing Towards Goal  Note: Out on unit for meals and meds only. Declined community and healthy coping skills groups this am. Mood stable, affect dull. Thoughts organized, logical and goal directed. No delusional statements made. Daily goal is to call sister about d/c planning for tomorrow. Staff focus is on d/c plans.   Goal: *STG: Decreased delusional thinking  Outcome: Progressing Towards Goal  Goal: *STG: Decreased hallucinations  Outcome: Progressing Towards Goal  Goal: *STG: Demonstrates ability to understand and use improved judgment in daily activities and relationships  Outcome: Progressing Towards Goal  Goal: Interventions  Outcome: Progressing Towards Goal     Problem: Diabetes Self-Management  Goal: *Monitoring blood glucose, interpreting and using results  Description: Identify recommended blood glucose targets  and personal targets.  Outcome: Progressing Towards Goal

## 2019-04-09 NOTE — Progress Notes (Signed)
PSYCHIATRIC PROGRESS NOTE       Patient: Ronald Saunders MRN: [id number]  SSN: [id number]    Date of Birth: June 26, 1941  Age: 78 y.o.  Sex: male      Admit Date: 04/02/2019    LOS: 7 days       Chief Complaint:  I am doing better.    Interval History:  Ronald Saunders is calm and pleasant. Denies si hi or avh. No paranoia or delusions noted. He is not showing any manic symptoms, no impulsivity, been appropriate in the unit.  He is tolerating his meds and denies side effects. Sleeping and resting well.          Past Medical History:  No past medical history on file.      ALLERGIES:(reviewed/updated 04/09/2019)  No Known Allergies    Laboratory report:  Lab Results   Component Value Date/Time    WBC 7.5 04/01/2019 01:21 PM    HGB 14.9 04/01/2019 01:21 PM    HCT 41.9 04/01/2019 01:21 PM    PLATELET 212 04/01/2019 01:21 PM    MCV 87.7 04/01/2019 01:21 PM      Lab Results   Component Value Date/Time    Sodium 138 04/01/2019 01:21 PM    Potassium 4.1 04/01/2019 01:21 PM    Chloride 108 04/01/2019 01:21 PM    CO2 24 04/01/2019 01:21 PM    Anion gap 6 04/01/2019 01:21 PM    Glucose 130 (H) 04/01/2019 01:21 PM    BUN 15 04/01/2019 01:21 PM    Creatinine 1.22 04/01/2019 01:21 PM    BUN/Creatinine ratio 12 04/01/2019 01:21 PM    GFR est AA >60 04/01/2019 01:21 PM    GFR est non-AA 58 (L) 04/01/2019 01:21 PM    Calcium 9.2 04/01/2019 01:21 PM    Bilirubin, total 0.5 04/01/2019 01:21 PM    Alk. phosphatase 119 (H) 04/01/2019 01:21 PM    Protein, total 7.9 04/01/2019 01:21 PM    Albumin 3.8 04/01/2019 01:21 PM    Globulin 4.1 (H) 04/01/2019 01:21 PM    A-G Ratio 0.9 (L) 04/01/2019 01:21 PM    ALT (SGPT) 49 04/01/2019 01:21 PM      Vitals:    04/08/19 0810 04/08/19 1625 04/08/19 2031 04/09/19 0821   BP:  124/74 149/83 131/68   Pulse:  84 80 89   Resp:  '16 16 16   ' Temp:  97.7 ??F (36.5 ??C) 97.4 ??F (36.3 ??C) 97.5 ??F (36.4 ??C)   SpO2:  96% 96% 96%   Weight: 64.2 kg (141 lb 9.6 oz)      Height:           Lab Results   Component Value  Date/Time    Valproic acid 60 04/07/2019 07:05 PM     No results found for: LITHM    Vital Signs  Patient Vitals for the past 24 hrs:   Temp Pulse Resp BP SpO2   04/09/19 0821 97.5 ??F (36.4 ??C) 89 16 131/68 96 %   04/08/19 2031 97.4 ??F (36.3 ??C) 80 16 149/83 96 %   04/08/19 1625 97.7 ??F (36.5 ??C) 84 16 124/74 96 %     Wt Readings from Last 3 Encounters:   04/08/19 64.2 kg (141 lb 9.6 oz)   04/01/19 61.2 kg (135 lb)     Temp Readings from Last 3 Encounters:   04/09/19 97.5 ??F (36.4 ??C)   04/02/19 97.7 ??F (36.5 ??C)     BP Readings from Last  3 Encounters:   04/09/19 131/68   04/02/19 156/85     Pulse Readings from Last 3 Encounters:   04/09/19 89   04/02/19 71       Radiology (reviewed/updated 04/09/2019)  Xr Chest Pa Lat    Result Date: 04/01/2019  EXAM:  XR CHEST PA LAT INDICATION:  shortness of breath bipolar COMPARISON:  None FINDINGS: PA and lateral radiographs of the chest demonstrate clear lungs.  The cardiac and mediastinal contours and pulmonary vascularity are normal.   There is a chronic left eighth posterior rib fracture. There is a moderate chronic compression fracture in the lower thoracic spine.     IMPRESSION: No acute process.       Side Effects: (reviewed/updated 04/09/2019)  None reported or admitted to.    Review of Systems: (reviewed/updated 04/09/2019)  Appetite: good  Sleep: good   All other Review of Systems: negative    Mental Status Exam:  Eye contact: Good eye contact  Psychomotor activity: relaxed  Speech is spontaneous  Thought process: Logical and goal directed   Mood is "ok"  Affect: full  Perception: No avh  Suicidal ideation: No si  Homicidal ideation: No hi  Insight/judgment: Poor  Cognition is grossly intact      Physical Exam:  Musculoskeletal system: steady gait  Tremor not present  Cog wheeling not present      Assessment and Plan:  [name] meets criteria for a diagnosis of Bipolar I disorder, current episode depressed with psychotic features.      Continue current medications  as prescribed.  We will closely monitor for safety.  We will encourage reality orientation.  Plan for dc in am.      I certify that this patients inpatient psychiatric hospital services furnished since the previous certification were, and continue to be, required for treatment that could reasonably be expected to improve the patient's condition, or for diagnostic study, and that the patient continues to need, on a daily basis, active treatment furnished directly by or requiring the supervision of inpatient psychiatric facility personnel. In addition, the hospital records show that services furnished were intensive treatment services, admission or related services, or equivalent services.      Signed:  Su Hoff, NP  04/09/2019

## 2019-04-09 NOTE — Progress Notes (Signed)
Problem: Falls - Risk of  Goal: *Absence of Falls  Description: Document Ronald Saunders Fall Risk and appropriate interventions in the flowsheet.  Outcome: Progressing Towards Goal  Note: Fall Risk Interventions:            Medication Interventions: Teach patient to arise slowly    Pt remains free from falls. Pt out in milieu for small periods of time. Returns briefly back to room following dinner time. No acute distress noted at this time. Staff will continue to monitor q 15 min checks.

## 2019-04-09 NOTE — Progress Notes (Signed)
Problem: Altered Thought Process (Adult/Pediatric)  Goal: *STG: Participates in treatment plan  Outcome: Progressing Towards Goal  Note: Out on unit for meals and meds only. Declined community and healthy coping skills groups this am. Mood stable, affect dull. Thoughts organized, logical and goal directed. No delusional statements made. Daily goal is to call sister about d/c planning for tomorrow. Staff focus is on d/c plans.   Goal: *STG: Decreased delusional thinking  Outcome: Progressing Towards Goal  Goal: *STG: Decreased hallucinations  Outcome: Progressing Towards Goal  Goal: *STG: Demonstrates ability to understand and use improved judgment in daily activities and relationships  Outcome: Progressing Towards Goal  Goal: Interventions  Outcome: Progressing Towards Goal     Problem: Diabetes Self-Management  Goal: *Monitoring blood glucose, interpreting and using results  Description: Identify recommended blood glucose targets  and personal targets.  Outcome: Progressing Towards Goal

## 2019-04-10 LAB — GLUCOSE, POC: Glucose (POC): 116 mg/dL — ABNORMAL HIGH (ref 65–100)

## 2019-04-10 LAB — POCT GLUCOSE: POC Glucose: 116 mg/dL — ABNORMAL HIGH (ref 65–100)

## 2019-04-10 MED ORDER — RISPERIDONE 1 MG TAB
1 mg | ORAL_TABLET | Freq: Every evening | ORAL | 0 refills | Status: AC
Start: 2019-04-10 — End: ?

## 2019-04-10 MED ORDER — DIVALPROEX 500 MG 24 HR TAB
500 mg | ORAL_TABLET | Freq: Every evening | ORAL | 0 refills | Status: AC
Start: 2019-04-10 — End: ?

## 2019-04-10 MED ORDER — HYDROXYZINE 25 MG TAB
25 mg | ORAL_TABLET | Freq: Four times a day (QID) | ORAL | 0 refills | Status: DC | PRN
Start: 2019-04-10 — End: 2019-04-10

## 2019-04-10 MED ORDER — ATORVASTATIN 40 MG TAB
40 mg | ORAL_TABLET | Freq: Every evening | ORAL | 0 refills | Status: AC
Start: 2019-04-10 — End: ?

## 2019-04-10 MED ORDER — MIRTAZAPINE 7.5 MG TAB
7.5 mg | ORAL_TABLET | Freq: Every evening | ORAL | 0 refills | Status: DC | PRN
Start: 2019-04-10 — End: 2019-04-10

## 2019-04-10 MED ORDER — ASPIRIN 81 MG CHEWABLE TAB
81 mg | ORAL_TABLET | Freq: Every day | ORAL | 0 refills | Status: AC
Start: 2019-04-10 — End: ?

## 2019-04-10 MED FILL — RISPERIDONE 1 MG TAB: 1 mg | ORAL | Qty: 1

## 2019-04-10 MED FILL — DIVALPROEX 500 MG 24 HR TAB: 500 mg | ORAL | Qty: 2

## 2019-04-10 MED FILL — CHILDREN'S ASPIRIN 81 MG CHEWABLE TABLET: 81 mg | ORAL | Qty: 1

## 2019-04-10 MED FILL — ATORVASTATIN 40 MG TAB: 40 mg | ORAL | Qty: 1

## 2019-04-10 NOTE — Progress Notes (Addendum)
Pt received resting quietly in bed with eyes closed and no acute distress. Respirations equal and unlabored. Will continue to monitor.      Problem: Falls - Risk of  Goal: *Absence of Falls  Description: Document Schmid Fall Risk and appropriate interventions in the flowsheet.  Outcome: Progressing Towards Goal  Note: Fall Risk Interventions:            Medication Interventions: Teach patient to arise slowly
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
## 2019-04-10 NOTE — Progress Notes (Signed)
Pharmacist Discharge Medication Reconciliation    Discharging Provider: Steffanie Epstein, NP    Significant PMH: No past medical history on file.    Chief Complaint for this Admission: No chief complaint on file.    Allergies: Patient has no known allergies.    Discharge Medications:   Current Discharge Medication List        START taking these medications    Details   aspirin 81 mg chewable tablet Take 1 Tab by mouth daily. Indications: treatment to prevent a heart attack  Qty: 30 Tab, Refills: 0      atorvastatin (LIPITOR) 40 mg tablet Take 1 Tab by mouth nightly. Indications: high amount of triglyceride in the blood  Qty: 30 Tab, Refills: 0      divalproex ER (DEPAKOTE ER) 500 mg ER tablet Take 2 Tabs by mouth nightly. Indications: mania associated with bipolar disorder  Qty: 60 Tab, Refills: 0      risperiDONE (RisperDAL) 1 mg tablet Take 1 Tab by mouth nightly. Indications: mania associated with bipolar disorder  Qty: 30 Tab, Refills: 0           The patient's chart, MAR and AVS were reviewed by Javeon Macmurray Smith, PHARMD.

## 2019-04-10 NOTE — Progress Notes (Signed)
Problem: Altered Thought Process (Adult/Pediatric)  Goal: *STG: Participates in treatment plan  Outcome: Progressing Towards Goal  Note: Out on unit passively engaged. Mood and affect full range, stable and reports feeling safe and rested. Denies SI, Denies AH/VH. Thoughts organized, logical and future focused. No delusional thinking noted. Daily goal is to d/c home. Staff focus is on d/c planning  Goal: *STG: Seeks staff when feelings of anxiety and fear arise  Outcome: Progressing Towards Goal  Goal: *STG: Attends activities and groups  Outcome: Progressing Towards Goal  Goal: *STG: Decreased delusional thinking  Outcome: Progressing Towards Goal  Goal: *STG: Decreased hallucinations  Outcome: Progressing Towards Goal  Goal: *STG: Demonstrates ability to understand and use improved judgment in daily activities and relationships  Outcome: Progressing Towards Goal  Goal: Interventions  Outcome: Progressing Towards Goal

## 2019-04-10 NOTE — Progress Notes (Signed)
Laboratory Monitoring for Antipsychotics:    This patient is currently prescribed the following medication(s):   Current Facility-Administered Medications   Medication Dose Route Frequency    atorvastatin (LIPITOR) tablet 40 mg  40 mg Oral QHS    risperiDONE (RisperDAL) tablet 1 mg  1 mg Oral QHS    aspirin chewable tablet 81 mg  81 mg Oral DAILY    divalproex ER (DEPAKOTE ER) 24 hour tablet 1,000 mg  1,000 mg Oral QHS     The following labs have been completed for monitoring of antipsychotics and/or mood stabilizers:    Height, Weight, BMI Estimation  Estimated body mass index is 25.9 kg/m?? as calculated from the following:    Height as of this encounter: 157.5 cm (62").    Weight as of this encounter: 64.2 kg (141 lb 9.6 oz).     Vital Signs/Blood Pressure  Visit Vitals  BP 116/83   Pulse 82   Temp 97.5 ??F (36.4 ??C)   Resp 16   Ht 157.5 cm (62")   Wt 64.2 kg (141 lb 9.6 oz)   SpO2 97%   BMI 25.90 kg/m??     Renal Function, Hepatic Function and Chemistry  Estimated Creatinine Clearance: 39.8 mL/min (by C-G formula based on SCr of 1.22 mg/dL).    Lab Results   Component Value Date/Time    Sodium 138 04/01/2019 01:21 PM    Potassium 4.1 04/01/2019 01:21 PM    Chloride 108 04/01/2019 01:21 PM    CO2 24 04/01/2019 01:21 PM    Anion gap 6 04/01/2019 01:21 PM    BUN 15 04/01/2019 01:21 PM    Creatinine 1.22 04/01/2019 01:21 PM    BUN/Creatinine ratio 12 04/01/2019 01:21 PM    Bilirubin, total 0.5 04/01/2019 01:21 PM    Protein, total 7.9 04/01/2019 01:21 PM    Albumin 3.8 04/01/2019 01:21 PM    Globulin 4.1 (H) 04/01/2019 01:21 PM    A-G Ratio 0.9 (L) 04/01/2019 01:21 PM    ALT (SGPT) 49 04/01/2019 01:21 PM    AST (SGOT) 35 04/01/2019 01:21 PM    Alk. phosphatase 119 (H) 04/01/2019 01:21 PM     Lab Results   Component Value Date/Time    Glucose 130 (H) 04/01/2019 01:21 PM    Glucose (POC) 116 (H) 04/10/2019 07:38 AM     Lab Results   Component Value Date/Time    Hemoglobin A1c 6.5 (H) 04/07/2019 09:35 PM     Hematology   Lab Results   Component Value Date/Time    WBC 7.5 04/01/2019 01:21 PM    RBC 4.78 04/01/2019 01:21 PM    HGB 14.9 04/01/2019 01:21 PM    HCT 41.9 04/01/2019 01:21 PM    MCV 87.7 04/01/2019 01:21 PM    MCH 31.2 04/01/2019 01:21 PM    MCHC 35.6 04/01/2019 01:21 PM    RDW 13.4 04/01/2019 01:21 PM    PLATELET 212 04/01/2019 01:21 PM     Lipids  Lab Results   Component Value Date/Time    Cholesterol, total 229 (H) 04/07/2019 07:05 PM    HDL Cholesterol 38 04/07/2019 07:05 PM    LDL,Direct 111 (H) 04/07/2019 07:05 PM    LDL, calculated Not calculated due to elevated triglyceride level 04/07/2019 07:05 PM    Triglyceride 630 (H) 04/07/2019 07:05 PM    CHOL/HDL Ratio 6.0 (H) 04/07/2019 07:05 PM     Thyroid Function  No results found for: TSH, TSH2, TSH3, TSHP, TSHELE, TT3, T3U,  T3UP, FRT3, FT3, FT4, FT4P, T4, T4P, FT4T, TT7, TSHEXT    Assessment/Plan:  Recommended baseline laboratory monitoring has been completed based on this patient's current medication regimen. No further interventions are needed at this time. Follow-up metabolic monitoring labs should be completed in 3 months (quarterly for the first year of antipsychotic therapy).     Patrick Jupiter, PHARMD

## 2019-04-10 NOTE — Other (Addendum)
Behavioral Health Interdisciplinary Rounds     Patient Name: Ronald Saunders  Age: 78 y.o.  Room/Bed:  733/01  Primary Diagnosis: <principal problem not specified>   Admission Status: Voluntary     Readmission within 30 days: no  Power of Attorney in place: no  Patient requires a blocked bed: no          Reason for blocked bed:   Sleep hours:  7      Participation in Care/Groups:   Medication Compliant?: Yes  PRNS (last 24 hours): None    Restraints (last 24 hours):  no     Alcohol screening (AUDIT) completed -  AUDIT Score: 0  If applicable, date SBIRT discussed in treatment team AND documented:    Tobacco - patient is a smoker: Have You Used Tobacco in the Past 30 Days: No  Illegal Drugs use: Have You Used Any Illegal Substances Over the Past 12 Months: No    24 hour chart check complete:     Patient goal(s) for today:   Treatment team focus/goals:   Progress note   Family Contact information:   Patient's preferred phone number for follow up call :     LOS:  8  Expected LOS:     Participating treatment team members: [name], * (assigned SW),

## 2019-04-10 NOTE — Progress Notes (Signed)
Reached out to patient at [phone] for follow up.  Person answering stated he wasn't there.

## 2019-04-10 NOTE — Progress Notes (Signed)
 Laboratory Monitoring for Antipsychotics:    This patient is currently prescribed the following medication(s):   Current Facility-Administered Medications   Medication Dose Route Frequency    atorvastatin (LIPITOR) tablet 40 mg  40 mg Oral QHS    risperiDONE (RisperDAL) tablet 1 mg  1 mg Oral QHS    aspirin chewable tablet 81 mg  81 mg Oral DAILY    divalproex ER (DEPAKOTE ER) 24 hour tablet 1,000 mg  1,000 mg Oral QHS     The following labs have been completed for monitoring of antipsychotics and/or mood stabilizers:    Height, Weight, BMI Estimation  Estimated body mass index is 25.9 kg/m as calculated from the following:    Height as of this encounter: 157.5 cm (62).    Weight as of this encounter: 64.2 kg (141 lb 9.6 oz).     Vital Signs/Blood Pressure  Visit Vitals  BP 116/83   Pulse 82   Temp 97.5 F (36.4 C)   Resp 16   Ht 157.5 cm (62)   Wt 64.2 kg (141 lb 9.6 oz)   SpO2 97%   BMI 25.90 kg/m     Renal Function, Hepatic Function and Chemistry  Estimated Creatinine Clearance: 39.8 mL/min (by C-G formula based on SCr of 1.22 mg/dL).    Lab Results   Component Value Date/Time    Sodium 138 04/01/2019 01:21 PM    Potassium 4.1 04/01/2019 01:21 PM    Chloride 108 04/01/2019 01:21 PM    CO2 24 04/01/2019 01:21 PM    Anion gap 6 04/01/2019 01:21 PM    BUN 15 04/01/2019 01:21 PM    Creatinine 1.22 04/01/2019 01:21 PM    BUN/Creatinine ratio 12 04/01/2019 01:21 PM    Bilirubin, total 0.5 04/01/2019 01:21 PM    Protein, total 7.9 04/01/2019 01:21 PM    Albumin 3.8 04/01/2019 01:21 PM    Globulin 4.1 (H) 04/01/2019 01:21 PM    A-G Ratio 0.9 (L) 04/01/2019 01:21 PM    ALT (SGPT) 49 04/01/2019 01:21 PM    AST (SGOT) 35 04/01/2019 01:21 PM    Alk. phosphatase 119 (H) 04/01/2019 01:21 PM     Lab Results   Component Value Date/Time    Glucose 130 (H) 04/01/2019 01:21 PM    Glucose (POC) 116 (H) 04/10/2019 07:38 AM     Lab Results   Component Value Date/Time    Hemoglobin A1c 6.5 (H) 04/07/2019 09:35 PM      Hematology  Lab Results   Component Value Date/Time    WBC 7.5 04/01/2019 01:21 PM    RBC 4.78 04/01/2019 01:21 PM    HGB 14.9 04/01/2019 01:21 PM    HCT 41.9 04/01/2019 01:21 PM    MCV 87.7 04/01/2019 01:21 PM    MCH 31.2 04/01/2019 01:21 PM    MCHC 35.6 04/01/2019 01:21 PM    RDW 13.4 04/01/2019 01:21 PM    PLATELET 212 04/01/2019 01:21 PM     Lipids  Lab Results   Component Value Date/Time    Cholesterol, total 229 (H) 04/07/2019 07:05 PM    HDL Cholesterol 38 04/07/2019 07:05 PM    LDL,Direct 111 (H) 04/07/2019 07:05 PM    LDL, calculated Not calculated due to elevated triglyceride level 04/07/2019 07:05 PM    Triglyceride 630 (H) 04/07/2019 07:05 PM    CHOL/HDL Ratio 6.0 (H) 04/07/2019 07:05 PM     Thyroid Function  No results found for: TSH, TSH2, TSH3, TSHP, TSHELE, TT3, T3U,  T3UP, FRT3, FT3, FT4, FT4P, T4, T4P, FT4T, TT7, TSHEXT    Assessment/Plan:  Recommended baseline laboratory monitoring has been completed based on this patient's current medication regimen. No further interventions are needed at this time. Follow-up metabolic monitoring labs should be completed in 3 months (quarterly for the first year of antipsychotic therapy).     Isaiah Sharps, PHARMD
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
## 2019-04-10 NOTE — Progress Notes (Signed)
Pharmacist Discharge Medication Reconciliation    Discharging Provider: Jason Coop, NP    Significant PMH: No past medical history on file.    Chief Complaint for this Admission: No chief complaint on file.    Allergies: Patient has no known allergies.    Discharge Medications:   Current Discharge Medication List        START taking these medications    Details   aspirin 81 mg chewable tablet Take 1 Tab by mouth daily. Indications: treatment to prevent a heart attack  Qty: 30 Tab, Refills: 0      atorvastatin (LIPITOR) 40 mg tablet Take 1 Tab by mouth nightly. Indications: high amount of triglyceride in the blood  Qty: 30 Tab, Refills: 0      divalproex ER (DEPAKOTE ER) 500 mg ER tablet Take 2 Tabs by mouth nightly. Indications: mania associated with bipolar disorder  Qty: 60 Tab, Refills: 0      risperiDONE (RisperDAL) 1 mg tablet Take 1 Tab by mouth nightly. Indications: mania associated with bipolar disorder  Qty: 30 Tab, Refills: 0           The patient's chart, MAR and AVS were reviewed by Foye Spurling, PHARMD.

## 2019-04-10 NOTE — Progress Notes (Signed)
Problem: Altered Thought Process (Adult/Pediatric)  Goal: *STG: Participates in treatment plan  Outcome: Progressing Towards Goal  Note: Out on unit passively engaged. Mood and affect full range, stable and reports feeling safe and rested. Denies SI, Denies AH/VH. Thoughts organized, logical and future focused. No delusional thinking noted. Daily goal is to d/c home. Staff focus is on d/c planning  Goal: *STG: Seeks staff when feelings of anxiety and fear arise  Outcome: Progressing Towards Goal  Goal: *STG: Attends activities and groups  Outcome: Progressing Towards Goal  Goal: *STG: Decreased delusional thinking  Outcome: Progressing Towards Goal  Goal: *STG: Decreased hallucinations  Outcome: Progressing Towards Goal  Goal: *STG: Demonstrates ability to understand and use improved judgment in daily activities and relationships  Outcome: Progressing Towards Goal  Goal: Interventions  Outcome: Progressing Towards Goal

## 2019-04-10 NOTE — Progress Notes (Signed)
Reached out to patient at [phone] for follow up.  Person answering stated he wasn't there.
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
## 2019-04-10 NOTE — Progress Notes (Signed)
Pt received resting quietly in bed with eyes closed and no acute distress. Respirations equal and unlabored. Will continue to monitor.      Problem: Falls - Risk of  Goal: *Absence of Falls  Description: Document Schmid Fall Risk and appropriate interventions in the flowsheet.  Outcome: Progressing Towards Goal  Note: Fall Risk Interventions:            Medication Interventions: Teach patient to arise slowly

## 2019-06-28 ENCOUNTER — Emergency Department: Payer: Medicare Other

## 2019-06-28 ENCOUNTER — Emergency Department
Admission: EM | Admit: 2019-06-28 | Discharge: 2019-06-28 | Disposition: A | Discharge status: Home / Self Care | Payer: Medicare Other | Attending: Emergency Medicine | Admitting: Emergency Medicine

## 2019-06-28 ENCOUNTER — Encounter: Payer: Self-pay | Admitting: Emergency Medicine

## 2019-06-28 ENCOUNTER — Other Ambulatory Visit: Payer: Self-pay

## 2019-06-28 DIAGNOSIS — Z79899 Other long term (current) drug therapy: Secondary | ICD-10-CM | POA: Diagnosis not present

## 2019-06-28 DIAGNOSIS — W010XXA Fall on same level from slipping, tripping and stumbling without subsequent striking against object, initial encounter: Secondary | ICD-10-CM | POA: Insufficient documentation

## 2019-06-28 DIAGNOSIS — F17210 Nicotine dependence, cigarettes, uncomplicated: Secondary | ICD-10-CM | POA: Diagnosis not present

## 2019-06-28 DIAGNOSIS — Y929 Unspecified place or not applicable: Secondary | ICD-10-CM | POA: Diagnosis not present

## 2019-06-28 DIAGNOSIS — Y939 Activity, unspecified: Secondary | ICD-10-CM | POA: Insufficient documentation

## 2019-06-28 DIAGNOSIS — E119 Type 2 diabetes mellitus without complications: Secondary | ICD-10-CM | POA: Insufficient documentation

## 2019-06-28 DIAGNOSIS — Z7984 Long term (current) use of oral hypoglycemic drugs: Secondary | ICD-10-CM | POA: Insufficient documentation

## 2019-06-28 DIAGNOSIS — S3992XA Unspecified injury of lower back, initial encounter: Secondary | ICD-10-CM | POA: Diagnosis present

## 2019-06-28 DIAGNOSIS — Y999 Unspecified external cause status: Secondary | ICD-10-CM | POA: Diagnosis not present

## 2019-06-28 DIAGNOSIS — S32050A Wedge compression fracture of fifth lumbar vertebra, initial encounter for closed fracture: Secondary | ICD-10-CM | POA: Diagnosis not present

## 2019-06-28 MED ORDER — LIDOCAINE 5 % EX PTCH
1.0000 | MEDICATED_PATCH | CUTANEOUS | Status: DC
  Administered 2019-06-28: 1 via TRANSDERMAL
  Filled 2019-06-28: qty 1
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
## 2019-06-28 NOTE — ED Triage Notes (Signed)
Patient coming [facility] from a friend's home for lower back pain. EMS vitals stable. Patient was tested for COVID while in jail (patient released on 9/10). Patient does not know his results.   EMS vitals: 151/90, 98.1, 97% RA, HR 101.

## 2019-06-28 NOTE — ED Notes (Addendum)
Pt sleepy and hard to arouse. Alert to person, confused as to date. Pt denies ETOH use or drug use. Pt cannot tell this RN why he is in ER.

## 2019-06-28 NOTE — ED Triage Notes (Addendum)
Pt to triage via w/c, mask in place with no distress noted, clothing wet; brought in by EMS for lower back pain from [facility]; st "I strained it 15years ago"; pt changed in blue paper scrubs and warm blanket given

## 2019-06-28 NOTE — ED Provider Notes (Signed)
[facility] Emergency Department Provider Note   ____________________________________________   First MD Initiated Contact with Patient 06/28/19 2115     (approximate)  I have reviewed the triage vital signs and the nursing notes.   HISTORY  Chief Complaint Back Pain    HPI Luis Cooper is a 78 y.o. male patient presents with back pain secondary to fall.  Patient arrived via EMS.  Patient's clothes were soaking wet secondary to rain exposure.  Patient was released from jail on 06/21/2019.  Patient state he was tested tested prior to release for COVID but did not have results.  Patient rates his pain as a 3/10.  Patient described the pain as "achy".  Patient state history of recurrent lumbar strain.  Patient denies radicular component to his pain.  Patient denies bladder/ bowel dysfunction.     Past Medical History:  Diagnosis Date  . Bipolar 1 disorder ([place])   . Diabetes mellitus without complication ([place])   . Gout     Patient Active Problem List   Diagnosis Date Noted  . Bipolar 1 disorder ([place]) 11/02/2018  . Adjustment disorder 11/02/2018  . Bipolar affective disorder, current episode manic ([place]) 09/15/2018  . Diabetes mellitus without complication ([place]) 42/35/3614  . Gout 09/15/2018    History reviewed. No pertinent surgical history.  Prior to Admission medications   Medication Sig Start Date End Date Taking? Authorizing Provider  amLODipine (NORVASC) 10 MG tablet Take 10 mg by mouth daily.    [provider]  atorvastatin (LIPITOR) 20 MG tablet Take 20 mg by mouth daily at 6 PM.  06/18/18   [provider]  divalproex (DEPAKOTE) 250 MG DR tablet Take 250 mg by mouth every 12 (twelve) hours.  08/29/18   [provider]  losartan (COZAAR) 25 MG tablet Take 25 mg by mouth.     [provider]  metFORMIN (GLUCOPHAGE) 500 MG tablet Take 1,000 mg by mouth 2 (two) times daily with a meal.    [provider]    Allergies Patient has no known allergies.  No family history on file.  Social History Social History   Tobacco Use  . Smoking status: Current Every Day Smoker  . Smokeless tobacco: Never Used  Substance Use Topics  . Alcohol use: Not on file  . Drug use: Not on file    Review of Systems  Constitutional: No fever/chills Eyes: No visual changes. ENT: No sore throat. Cardiovascular: Denies chest pain. Respiratory: Denies shortness of breath. Gastrointestinal: No abdominal pain.  No nausea, no vomiting.  No diarrhea.  No constipation. Genitourinary: Negative for dysuria. Musculoskeletal: Negative for back pain. Skin: Negative for rash. Neurological: Negative for headaches, focal weakness or numbness. Psychiatric:  Bipolar Endocrine:  Diabetes and hypertension.  ____________________________________________   PHYSICAL EXAM:  VITAL SIGNS: ED Triage Vitals  Enc Vitals Group     BP --      Pulse --      Resp --      Temp --      Temp src --      SpO2 --      Weight 06/28/19 2040 135 lb (61.2 kg)     Height 06/28/19 2040 5\' 2"  (1.575 m)     Head Circumference --      Peak Flow --      Pain Score 06/28/19 2041 3     Pain Loc --      Pain Edu? --  Excl. in GC? --     Constitutional: Alert and oriented. Well appearing and in no acute distress. Neck: No cervical spine tenderness to palpation. Cardiovascular: Normal rate, regular rhythm. Grossly normal heart sounds.  Good peripheral circulation. Respiratory: Normal respiratory effort.  No retractions. Lungs CTAB. Gastrointestinal: Soft and nontender. No distention. No abdominal bruits. No CVA tenderness. Musculoskeletal: No obvious deformity to the lumbar spine.  No guarding with palpation spinal processes.  Patient decreased range of motion with right lateral movements noted by complaint of pain.   Neurologic:  Normal speech and language. No gross focal neurologic deficits are appreciated. No  gait instability. Skin:  Skin is warm, dry and intact. No rash noted. Psychiatric: Mood and affect are normal. Speech and behavior are normal.  ____________________________________________   LABS (all labs ordered are listed, but only abnormal results are displayed)  Labs Reviewed - No data to display ____________________________________________  EKG   ____________________________________________  RADIOLOGY  ED MD interpretation:  Official radiology report(s): Dg Lumbar Spine 2-3 Views  Result Date: 06/28/2019 CLINICAL DATA:  Low back pain secondary to a fall. EXAM: LUMBAR SPINE - 2-3 VIEW COMPARISON:  None. FINDINGS: There is a moderate compression fracture of the superior aspect of L5. Is grade 1 spondylolisthesis at L5-S1. There is an old compression fracture T12. There appears to be ankylosis of the T11 and T12 vertebra. Possible pars defects at L5. Diffuse osteopenia. Aortic atherosclerosis. IMPRESSION: 1. Moderate compression fracture of the superior aspect of L5. 2. Old compression fracture of T12. 3. Grade 1 spondylolisthesis at L5-S1. 4. Possible pars defects at L5. Electronically Signed   By: Francene BoyersJames  Maxwell M.D.   On: 06/28/2019 22:05    ____________________________________________   PROCEDURES  Procedure(s) performed (including Critical Care):  Procedures   ____________________________________________   INITIAL IMPRESSION / ASSESSMENT AND PLAN / ED COURSE  As part of my medical decision making, I reviewed the following data within the electronic MEDICAL RECORD NUMBER         Luis Cooper was evaluated in Emergency Department on 06/28/2019 for the symptoms described in the history of present illness. He was evaluated in the context of the global COVID-19 pandemic, which necessitated consideration that the patient might be at risk for infection with the SARS-CoV-2 virus that causes COVID-19. Institutional protocols and algorithms that pertain to the evaluation of  patients at risk for COVID-19 are in a state of rapid change based on information released by regulatory bodies including the CDC and federal and state organizations. These policies and algorithms were followed during the patient's care in the ED.    Patient presents with back pain secondary to a twisting fall.  Discussed x-ray findings with patient consistent with compression fracture of L5.  Lidoderm patch applied to area of concern.  Patient follow orthopedic for definitive evaluation and treatment.  ____________________________________________   FINAL CLINICAL IMPRESSION(S) / ED DIAGNOSES  Final diagnoses:  Compression fracture of L5 lumbar vertebra, closed, initial encounter [facility](HCC)     ED Discharge Orders    None       Note:  This document was prepared using Dragon voice recognition software and may include unintentional dictation errors.    Joni ReiningSmith, Ronald K, PA-C 06/28/19 2221    Concha SeFunke, Mary E, MD 06/29/19 [phone]

## 2019-07-16 ENCOUNTER — Encounter: Payer: Self-pay | Admitting: Emergency Medicine

## 2019-07-16 ENCOUNTER — Other Ambulatory Visit: Payer: Self-pay

## 2019-07-16 ENCOUNTER — Emergency Department
Admission: EM | Admit: 2019-07-16 | Discharge: 2019-07-18 | Disposition: A | Discharge status: Other Acute Inpatient Hospital | Payer: Medicare Other | Attending: Emergency Medicine | Admitting: Emergency Medicine

## 2019-07-16 DIAGNOSIS — F17210 Nicotine dependence, cigarettes, uncomplicated: Secondary | ICD-10-CM | POA: Diagnosis not present

## 2019-07-16 DIAGNOSIS — F319 Bipolar disorder, unspecified: Secondary | ICD-10-CM | POA: Diagnosis present

## 2019-07-16 DIAGNOSIS — Z20828 Contact with and (suspected) exposure to other viral communicable diseases: Secondary | ICD-10-CM | POA: Insufficient documentation

## 2019-07-16 DIAGNOSIS — Z79899 Other long term (current) drug therapy: Secondary | ICD-10-CM | POA: Diagnosis not present

## 2019-07-16 DIAGNOSIS — Z7984 Long term (current) use of oral hypoglycemic drugs: Secondary | ICD-10-CM | POA: Diagnosis not present

## 2019-07-16 DIAGNOSIS — R456 Violent behavior: Secondary | ICD-10-CM | POA: Diagnosis present

## 2019-07-16 DIAGNOSIS — M109 Gout, unspecified: Secondary | ICD-10-CM | POA: Diagnosis present

## 2019-07-16 DIAGNOSIS — F3110 Bipolar disorder, current episode manic without psychotic features, unspecified: Secondary | ICD-10-CM | POA: Diagnosis present

## 2019-07-16 DIAGNOSIS — E119 Type 2 diabetes mellitus without complications: Secondary | ICD-10-CM | POA: Diagnosis not present

## 2019-07-16 DIAGNOSIS — F4320 Adjustment disorder, unspecified: Secondary | ICD-10-CM | POA: Diagnosis present

## 2019-07-16 DIAGNOSIS — F29 Unspecified psychosis not due to a substance or known physiological condition: Secondary | ICD-10-CM

## 2019-07-16 DIAGNOSIS — F1910 Other psychoactive substance abuse, uncomplicated: Secondary | ICD-10-CM

## 2019-07-16 LAB — SALICYLATE LEVEL: Salicylate Lvl: <7 mg/dL (ref 2.8–30.0)

## 2019-07-16 LAB — URINALYSIS, COMPLETE (UACMP) WITH MICROSCOPIC
Bacteria, UA: NONE SEEN
Bilirubin Urine: NEGATIVE
Glucose, UA: NEGATIVE mg/dL
Hgb urine dipstick: NEGATIVE
Ketones, ur: 5 mg/dL — AB
Leukocytes,Ua: NEGATIVE
Nitrite: NEGATIVE
Protein, ur: NEGATIVE mg/dL
Specific Gravity, Urine: 1.009 (ref 1.005–1.030)
Squamous Epithelial / HPF: NONE SEEN (ref 0–5)
pH: 5 (ref 5.0–8.0)

## 2019-07-16 LAB — CBC WITH DIFFERENTIAL/PLATELET
Abs Immature Granulocytes: 0.02 10*3/uL (ref 0.00–0.07)
Basophils Absolute: 0.1 10*3/uL (ref 0.0–0.1)
Basophils Relative: 1 %
Eosinophils Absolute: 0.1 10*3/uL (ref 0.0–0.5)
Eosinophils Relative: 1 %
HCT: 44 % (ref 39.0–52.0)
Hemoglobin: 15 g/dL (ref 13.0–17.0)
Immature Granulocytes: 0 %
Lymphocytes Relative: 12 %
Lymphs Abs: 1.1 10*3/uL (ref 0.7–4.0)
MCH: 31.1 pg (ref 26.0–34.0)
MCHC: 34.1 g/dL (ref 30.0–36.0)
MCV: 91.3 fL (ref 80.0–100.0)
Monocytes Absolute: 0.7 10*3/uL (ref 0.1–1.0)
Monocytes Relative: 7 %
Neutro Abs: 7.5 10*3/uL (ref 1.7–7.7)
Neutrophils Relative %: 79 %
Platelets: 323 10*3/uL (ref 150–400)
RBC: 4.82 MIL/uL (ref 4.22–5.81)
RDW: 13 % (ref 11.5–15.5)
WBC: 9.4 10*3/uL (ref 4.0–10.5)
nRBC: 0 % (ref 0.0–0.2)

## 2019-07-16 LAB — COMPREHENSIVE METABOLIC PANEL
ALT: 34 U/L (ref 0–44)
AST: 55 U/L — ABNORMAL HIGH (ref 15–41)
Albumin: 4.4 g/dL (ref 3.5–5.0)
Alkaline Phosphatase: 91 U/L (ref 38–126)
Anion gap: 16 — ABNORMAL HIGH (ref 5–15)
BUN: 24 mg/dL — ABNORMAL HIGH (ref 8–23)
CO2: 18 mmol/L — ABNORMAL LOW (ref 22–32)
Calcium: 10 mg/dL (ref 8.9–10.3)
Chloride: 99 mmol/L (ref 98–111)
Creatinine, Ser: 1.47 mg/dL — ABNORMAL HIGH (ref 0.61–1.24)
GFR calc Af Amer: 53 mL/min — ABNORMAL LOW (ref >60)
GFR calc non Af Amer: 46 mL/min — ABNORMAL LOW (ref >60)
Glucose, Bld: 179 mg/dL — ABNORMAL HIGH (ref 70–99)
Potassium: 3.6 mmol/L (ref 3.5–5.1)
Sodium: 133 mmol/L — ABNORMAL LOW (ref 135–145)
Total Bilirubin: 1.2 mg/dL (ref 0.3–1.2)
Total Protein: 8.2 g/dL — ABNORMAL HIGH (ref 6.5–8.1)

## 2019-07-16 LAB — ETHANOL: Alcohol, Ethyl (B): <10 mg/dL (ref <10)

## 2019-07-16 LAB — URINE DRUG SCREEN, QUALITATIVE (ARMC ONLY)
Amphetamines, Ur Screen: NOT DETECTED
Barbiturates, Ur Screen: NOT DETECTED
Benzodiazepine, Ur Scrn: NOT DETECTED
Cannabinoid 50 Ng, Ur ~~LOC~~: NOT DETECTED
Cocaine Metabolite,Ur ~~LOC~~: NOT DETECTED
MDMA (Ecstasy)Ur Screen: NOT DETECTED
Methadone Scn, Ur: NOT DETECTED
Opiate, Ur Screen: NOT DETECTED
Phencyclidine (PCP) Ur S: NOT DETECTED
Tricyclic, Ur Screen: NOT DETECTED

## 2019-07-16 LAB — ACETAMINOPHEN LEVEL: Acetaminophen (Tylenol), Serum: <10 ug/mL — ABNORMAL LOW (ref 10–30)

## 2019-07-16 LAB — SARS CORONAVIRUS 2 BY RT PCR (HOSPITAL ORDER, PERFORMED IN ~~LOC~~ HOSPITAL LAB): SARS Coronavirus 2: NEGATIVE

## 2019-07-16 MED ORDER — LOSARTAN POTASSIUM 25 MG PO TABS
25.0000 mg | ORAL_TABLET | Freq: Every day | ORAL | Status: DC
  Administered 2019-07-16 – 2019-07-17 (×2): 25 mg via ORAL
  Filled 2019-07-16 (×3): qty 1

## 2019-07-16 MED ORDER — AMLODIPINE BESYLATE 5 MG PO TABS
10.0000 mg | ORAL_TABLET | Freq: Every day | ORAL | Status: DC
  Administered 2019-07-16: 10 mg via ORAL
  Filled 2019-07-16 (×2): qty 2

## 2019-07-16 MED ORDER — DIVALPROEX SODIUM 250 MG PO DR TAB
250.0000 mg | DELAYED_RELEASE_TABLET | Freq: Two times a day (BID) | ORAL | Status: DC
  Administered 2019-07-16 – 2019-07-17 (×2): 250 mg via ORAL
  Filled 2019-07-16 (×3): qty 1

## 2019-07-16 MED ORDER — ATORVASTATIN CALCIUM 20 MG PO TABS
20.0000 mg | ORAL_TABLET | Freq: Every day | ORAL | Status: DC
  Administered 2019-07-16: 20 mg via ORAL
  Filled 2019-07-16: qty 1

## 2019-07-16 MED ORDER — METFORMIN HCL 500 MG PO TABS
500.0000 mg | ORAL_TABLET | Freq: Two times a day (BID) | ORAL | Status: DC
  Administered 2019-07-18: 500 mg via ORAL
  Filled 2019-07-16 (×2): qty 1

## 2019-07-16 NOTE — ED Triage Notes (Signed)
Pt in via [facility] under IVC. Pt cursing and not cooperative with staff. RN asked patient about his condition today and pt states, "none of your dam business". Pt aggressive

## 2019-07-16 NOTE — ED Notes (Signed)
Lunch provided.

## 2019-07-16 NOTE — ED Notes (Signed)
IVC PENDING  CONSULT ?

## 2019-07-16 NOTE — ED Notes (Signed)
covid test completed and walked over to lab.

## 2019-07-16 NOTE — ED Notes (Signed)
Hourly rounding reveals patient awake in room. No complaints, stable, in no acute distress. Q15 minute rounds and monitoring via Security Cameras to continue. 

## 2019-07-16 NOTE — ED Notes (Signed)
Report to include Situation, Background, Assessment, and Recommendations received from Ally RN. Patient alert, warm and dry, in no acute distress. Patient denies SI, HI, AVH and pain. Patient made aware of Q15 minute rounds and security cameras for their safety. Patient instructed to come to me with needs or concerns. 

## 2019-07-16 NOTE — ED Notes (Addendum)
Report to receiving nurse Radonna Ricker. [place] police on way to escort patient to [facility] room 3

## 2019-07-16 NOTE — ED Notes (Signed)
Patient refused urine and covid swab

## 2019-07-16 NOTE — ED Notes (Signed)
Assumed care of patient patient repetitive and non compliant. Clothing removed, patient paced into hospital attire. Patient unable to stay still in room, pacing back and forth to bathroom. Security watch for aggression and non compliance. Safety maintained will monitor.

## 2019-07-16 NOTE — ED Notes (Signed)
Hourly rounding reveals patient in day room. No complaints, stable, in no acute distress. Q15 minute rounds and monitoring via Security Cameras to continue. 
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
## 2019-07-16 NOTE — ED Provider Notes (Signed)
[facility] Emergency Department Provider Note   ____________________________________________   First MD Initiated Contact with Patient 07/16/19 1030     (approximate)  I have reviewed the triage vital signs and the nursing notes.   HISTORY  Chief Complaint Aggressive Behavior    HPI Gunther Zawadzki is a 78 y.o. male with past medical history of bipolar disorder and diabetes presents to the ED for aggressive behavior.  Patient found to be aggressive and uncooperative with the police and placed under IVC prior to arrival in the ED.  He states that he smoked a cigarette that contain marijuana, but denies any other drug use.  When asked if he has been drinking alcohol patient states "a little".  He repeatedly demands that I "watch the TV".  He states "I know the presidential codes."  He denies any homicidal ideation or suicidal ideation.        Past Medical History:  Diagnosis Date  . Bipolar 1 disorder (HCC)   . Diabetes mellitus without complication (HCC)   . Gout     Patient Active Problem List   Diagnosis Date Noted  . Bipolar 1 disorder (HCC) 11/02/2018  . Adjustment disorder 11/02/2018  . Bipolar affective disorder, current episode manic (HCC) 09/15/2018  . Diabetes mellitus without complication (HCC) 09/15/2018  . Gout 09/15/2018    History reviewed. No pertinent surgical history.  Prior to Admission medications   Medication Sig Start Date End Date Taking? Authorizing Provider  amLODipine (NORVASC) 10 MG tablet Take 10 mg by mouth daily.    [provider]  atorvastatin (LIPITOR) 20 MG tablet Take 20 mg by mouth daily at 6 PM.  06/18/18   [provider]  divalproex (DEPAKOTE) 250 MG DR tablet Take 250 mg by mouth every 12 (twelve) hours.  08/29/18   [provider]  losartan (COZAAR) 25 MG tablet Take 25 mg by mouth.     [provider]  metFORMIN (GLUCOPHAGE) 500 MG tablet Take 1,000 mg by mouth 2 (two)  times daily with a meal.    [provider]    Allergies Patient has no known allergies.  No family history on file.  Social History Social History   Tobacco Use  . Smoking status: Current Every Day Smoker  . Smokeless tobacco: Never Used  Substance Use Topics  . Alcohol use: Not on file  . Drug use: Not on file    Review of Systems  Constitutional: No fever/chills Eyes: No visual changes. ENT: No sore throat. Cardiovascular: Denies chest pain. Respiratory: Denies shortness of breath. Gastrointestinal: No abdominal pain.  No nausea, no vomiting.  No diarrhea.  No constipation. Genitourinary: Negative for dysuria. Musculoskeletal: Negative for back pain. Skin: Negative for rash. Neurological: Negative for headaches, focal weakness or numbness.  Positive for substance abuse.  ____________________________________________   PHYSICAL EXAM:  VITAL SIGNS: ED Triage Vitals  Enc Vitals Group     BP 07/16/19 1002 (!) 167/90     Pulse Rate 07/16/19 1002 (!) 142     Resp 07/16/19 1002 20     Temp 07/16/19 1002 98.5 F (36.9 C)     Temp Source 07/16/19 1002 Oral     SpO2 07/16/19 1002 97 %     Weight 07/16/19 1003 120 lb (54.4 kg)     Height 07/16/19 1003 5\' 2"  (1.575 m)     Head Circumference --      Peak Flow --      Pain Score  07/16/19 1005 0     Pain Loc --      Pain Edu? --      Excl. in [place]? --     Constitutional: Alert and oriented. Eyes: Conjunctivae are normal. Head: Atraumatic. Nose: No congestion/rhinnorhea. Mouth/Throat: Mucous membranes are moist. Neck: Normal ROM Cardiovascular: Normal rate, regular rhythm. Grossly normal heart sounds. Respiratory: Normal respiratory effort.  No retractions. Lungs CTAB. Gastrointestinal: Soft and nontender. No distention. Genitourinary: deferred Musculoskeletal: No lower extremity tenderness nor edema. Neurologic:  Normal speech and language. No gross focal neurologic deficits are appreciated. Skin:  Skin  is warm, dry and intact. No rash noted. Psychiatric: Labile active affect, aggressive at times.  Disorganized thought content.  ____________________________________________   LABS (all labs ordered are listed, but only abnormal results are displayed)  Labs Reviewed  COMPREHENSIVE METABOLIC PANEL - Abnormal; Notable for the following components:      Result Value   Sodium 133 (*)    CO2 18 (*)    Glucose, Bld 179 (*)    BUN 24 (*)    Creatinine, Ser 1.47 (*)    Total Protein 8.2 (*)    AST 55 (*)    GFR calc non Af Amer 46 (*)    GFR calc Af Amer 53 (*)    Anion gap 16 (*)    All other components within normal limits  ACETAMINOPHEN LEVEL - Abnormal; Notable for the following components:   Acetaminophen (Tylenol), Serum <10 (*)    All other components within normal limits  URINALYSIS, COMPLETE (UACMP) WITH MICROSCOPIC - Abnormal; Notable for the following components:   Color, Urine YELLOW (*)    APPearance CLEAR (*)    Ketones, ur 5 (*)    All other components within normal limits  SARS CORONAVIRUS 2 (HOSPITAL ORDER, [facility])  CBC WITH DIFFERENTIAL/PLATELET  SALICYLATE LEVEL  URINE DRUG SCREEN, QUALITATIVE ([facility] ONLY)  ETHANOL     PROCEDURES  Procedure(s) performed (including Critical Care):  Procedures   ____________________________________________   INITIAL IMPRESSION / ASSESSMENT AND PLAN / ED COURSE       78 year old male brought to the ED in police custody under IVC after being found with erratic behavior.  He is initially aggressive upon arrival to the ED, quickly settled down, did not require any restraints.  He does appear acutely psychotic with delusions and disorganized thought content.  This appears likely due to substance abuse.  Labs show mild AKI but are otherwise unremarkable, patient medically cleared and will restart home medications.  Patient evaluated by psychiatry, and he is now admitting that he used PCP.  He is  calm and cooperative for now, continues to be disorganized.  Ativan may be used as needed, otherwise will monitor patient for any improvement here in the ED.  Patient turned over to Dr. Joan Mayans pending further observation.      ____________________________________________   FINAL CLINICAL IMPRESSION(S) / ED DIAGNOSES  Final diagnoses:  Substance abuse ([place])  Psychosis, unspecified psychosis type [facility])     ED Discharge Orders    None       Note:  This document was prepared using Dragon voice recognition software and may include unintentional dictation errors.   Blake Divine, MD 07/16/19 1622

## 2019-07-16 NOTE — ED Notes (Signed)
Patient refused covid swab

## 2019-07-16 NOTE — ED Notes (Signed)
Hourly rounding reveals patient in room. No complaints, stable, in no acute distress. Q15 minute rounds and monitoring via Security Cameras to continue. 

## 2019-07-16 NOTE — ED Notes (Signed)
Gave food tray with juice. 

## 2019-07-16 NOTE — ED Notes (Signed)
Pt arrives and states he is fine and was just needing to make a emergency call at the [facility]. Pt answering questions at this time. Pt denies any SI or HI. Pt states he doesn't do drugs. Pt also denies alcohol at this time. Pt hostile and a little verbally aggressive at times. Pt requesting a phone and states "You better not forget it I know who you are"

## 2019-07-16 NOTE — BH Assessment (Addendum)
Assessment Note  Luis Cooper is an 78 y.o. male who presents to ED with disorganized thought patterns and speaking in disorganized speech/word salad. Patient appears to be under the influence of some sort of substance; however, pt was unable to recall the name of this substance. When speaking with the psychiatry team, pt shared that he recently smoked "Cheyanne". He refused to provide a urine sample for a UDS, stating "You not gonna do a drug test on me". Patient did not appear to be oriented to person, place, or situation. He was also very restless. Pt stated "I only need the psychiatric shit when I get severe". Pt did not appear to be responding to internal stimuli. This writer was unable to thoroughly assess for safety, as pt was not oriented enough to accurately respond to assessment questions.  Diagnosis: Bipolar 1 Disorder  Past Medical History:  Past Medical History:  Diagnosis Date  . Bipolar 1 disorder (HCC)   . Diabetes mellitus without complication (HCC)   . Gout     History reviewed. No pertinent surgical history.  Family History: No family history on file.  Social History:  reports that he has been smoking. He has never used smokeless tobacco. No history on file for alcohol and drug.  Additional Social History:  Alcohol / Drug Use Pain Medications: See MAR Prescriptions: See MAR Over the Counter: See MAR History of alcohol / drug use?: Yes(Patient refused to disclose what substances he has used) Longest period of sobriety (when/how long): UKN  CIWA: CIWA-Ar BP: (!) 167/90 Pulse Rate: (!) 142 COWS:    Allergies: No Known Allergies  Home Medications: (Not in a hospital admission)   OB/GYN Status:  No LMP for male patient.  General Assessment Data Location of Assessment: [facility] ED TTS Assessment: In system Is this a Tele or Face-to-Face Assessment?: Face-to-Face Is this an Initial Assessment or a Re-assessment for this encounter?: Initial Assessment Patient  Accompanied by:: N/A Language Other than English: No Living Arrangements: Other (Comment) What gender do you identify as?: Male Marital status: Single Living Arrangements: [name]) Can pt return to current living arrangement?: Yes Admission Status: Involuntary Petitioner: ED Attending Is patient capable of signing voluntary admission?: No Referral Source: Self/Family/Friend Insurance type: [facility] Medicare  Medical Screening Exam [facility] Walk-in ONLY) Medical Exam completed: Yes  Crisis Care Plan Living Arrangements: [name]) Legal Guardian: Other:(Self) Name of Psychiatrist: None Reported Name of Therapist: UKN  Education Status Is patient currently in school?: No Is the patient employed, unemployed or receiving disability?[name] [name] - UTA)  Risk to self with the past 6 months Suicidal Ideation: (UKN - UTA) Has patient been a risk to self within the past 6 months prior to admission? : [name] - UTA) Suicidal Intent: [name] - UTA) Has patient had any suicidal intent within the past 6 months prior to admission? [name] [name] - UTA) Is patient at risk for suicide?: [name] - UTA) Suicidal Plan?: [name] - UTA) Has patient had any suicidal plan within the past 6 months prior to admission? [name] [name] - UTA) Access to Means: [name] - UTA) What has been your use of drugs/alcohol within the last 12 months?: (UKN - UTA) Previous Attempts/Gestures: [name] - UTA) How many times?: [name] - UTA) Other Self Harm Risks: [name] - UTA) Triggers for Past Attempts: [name] - UTA) Intentional Self Injurious Behavior: (UKN - UTA) Family Suicide History: (UKN - UTA) Recent stressful life event(s): (UKN - UTA) Persecutory voices/beliefs?: [name] - UTA) Depression: [name] - UTA) Depression Symptoms: (  Valerie Salts) Substance abuse history and/or treatment for substance abuse?: Myer Haff - UTA) Suicide prevention information given to non-admitted patients: (UKN - UTA)  Risk to Others within the past 6 months Homicidal Ideation: (UKN - UTA) Does  patient have any lifetime risk of violence toward others beyond the six months prior to admission? : Myer Haff - UTA) Thoughts of Harm to Others: Myer Haff - UTA) Current Homicidal Intent: Myer Haff - UTA) Current Homicidal Plan: Myer Haff - UTA) Access to Homicidal Means: Myer Haff - UTA) Identified Victim: Myer Haff - UTA) History of harm to others?[name] Myer Haff - UTA) Assessment of Violence: Myer Haff - UTA) Violent Behavior Description: Myer Haff - UTA) Does patient have access to weapons?: Myer Haff - Pincus Badder) Criminal Charges Pending?: Myer Haff - UTA) Does patient have a court date: Myer Haff - UTA) Is patient on probation?: Myer Haff - UTA)  Psychosis Hallucinations: Myer Haff - UTA) Delusions: Myer Haff - UTA)  Mental Status Report Appearance/Hygiene: Unable to Assess Eye Contact: Unable to Assess Motor Activity: Unable to assess Speech: Word salad, Incoherent Level of Consciousness: Unable to assess Mood: Euphoric Affect: Euphoric Anxiety Level: Minimal Thought Processes: Irrelevant Judgement: Impaired Orientation: Unable to assess Obsessive Compulsive Thoughts/Behaviors: Unable to Assess  Cognitive Functioning Concentration: Poor Memory: Unable to Assess Is patient IDD: No Insight: see judgement above Impulse Control: Poor Appetite: Poor Have you had any weight changes? : No Change Sleep: Unable to Assess Total Hours of Sleep: (UKN - UTA) Vegetative Symptoms: Unable to Assess  ADLScreening [facility] Assessment Services) Patient's cognitive ability adequate to safely complete daily activities?: Yes Patient able to express need for assistance with ADLs?: Yes Independently performs ADLs?: Yes (appropriate for developmental age)  Prior Inpatient Therapy Prior Inpatient Therapy: Myer Haff - UTA)  Prior Outpatient Therapy Prior Outpatient Therapy: Myer Haff - UTA)  ADL Screening (condition at time of admission) Patient's cognitive ability adequate to safely complete daily activities?: Yes Patient able to express need for assistance with ADLs?:  Yes Independently performs ADLs?: Yes (appropriate for developmental age)       Abuse/Neglect Assessment (Assessment to be complete while patient is alone) Abuse/Neglect Assessment Can Be Completed: Yes Physical Abuse: Denies Verbal Abuse: Denies Sexual Abuse: Denies Exploitation of patient/patient's resources: Denies Self-Neglect: Denies Values / Beliefs Cultural Requests During Hospitalization: None Spiritual Requests During Hospitalization: None Consults Spiritual Care Consult Needed: No Social Work Consult Needed: No         Child/Adolescent Assessment Running Away Risk: (Patient is an adult)  Disposition:  Disposition Initial Assessment Completed for this Encounter: Yes Disposition of Patient: (Pending Re-Assessment)  On Site Evaluation by:   Reviewed with Physician:    Frederich Cha 07/16/2019 4:25 PM

## 2019-07-17 LAB — GLUCOSE, CAPILLARY: Glucose-Capillary: 119 mg/dL — ABNORMAL HIGH (ref 70–99)

## 2019-07-17 MED ORDER — DIPHENHYDRAMINE HCL 50 MG/ML IJ SOLN
25.0000 mg | Freq: Once | INTRAMUSCULAR | Status: AC
  Administered 2019-07-17: 25 mg via INTRAMUSCULAR
  Filled 2019-07-17: qty 1

## 2019-07-17 MED ORDER — OLANZAPINE 5 MG PO TABS
5.0000 mg | ORAL_TABLET | Freq: Every day | ORAL | Status: DC
  Administered 2019-07-17: 15:00:00 5 mg via ORAL
  Filled 2019-07-17: qty 1

## 2019-07-17 MED ORDER — LORAZEPAM 2 MG/ML IJ SOLN
2.0000 mg | Freq: Once | INTRAMUSCULAR | Status: AC
  Administered 2019-07-17: 02:00:00 2 mg via INTRAMUSCULAR
  Filled 2019-07-17: qty 1

## 2019-07-17 MED ORDER — SODIUM CHLORIDE 0.9 % IV BOLUS
1000.0000 mL | Freq: Once | INTRAVENOUS | Status: AC
  Administered 2019-07-17: 15:00:00 1000 mL via INTRAVENOUS

## 2019-07-17 MED ORDER — HALOPERIDOL LACTATE 5 MG/ML IJ SOLN
5.0000 mg | Freq: Once | INTRAMUSCULAR | Status: AC
  Administered 2019-07-17: 5 mg via INTRAMUSCULAR
  Filled 2019-07-17: qty 1

## 2019-07-17 NOTE — ED Notes (Signed)
Hourly rounding reveals patient sleeping in room. No complaints, stable, in no acute distress. Q15 minute rounds and monitoring via Security Cameras to continue. 

## 2019-07-17 NOTE — ED Notes (Signed)
Patient up out of bed walking to bathroom, voided all the way to bathroom, bed was soiled of urine, patient was provided peri care materials and clean dry clothing and sock. Patient room and day room was moped up. Clean lined applied to patient bed. Patient currently sitting in chair in his room refusing to get into bed at present time, patient was advised by writer its ok to sit in his chair, he can even come and sit in day room if he would like. Patient at 50% of his lunch, scheduled med administered. Patient accepted to [facility] as per provider. Will be transferred tomorrow in the am hours.

## 2019-07-17 NOTE — ED Notes (Signed)
IVC/Pending Consult 

## 2019-07-17 NOTE — ED Notes (Signed)
Patient yelling out. Patient will not respond to redirection. Patient jumped up and charged this nurse with fist balled up.

## 2019-07-17 NOTE — ED Notes (Signed)
Assumed care of patient patient aakenss to verbal stimuli. Denies pain/sob/ HI/SI/HV. Reports ate his breakfast. Patient laying in bed. Writer unable to get him up. Patient having conversations with eyes closed. Vss. Awaiting placement.

## 2019-07-17 NOTE — ED Notes (Signed)
Hourly rounding reveals patient in rest room. No complaints, stable, in no acute distress. Q15 minute rounds and monitoring via Security Cameras to continue. 

## 2019-07-17 NOTE — ED Notes (Signed)
Report to include Situation, Background, Assessment, and Recommendations received from Jeannette RN. Patient alert, warm and dry, in no acute distress. Patient denies SI, HI, AVH and pain. Patient made aware of Q15 minute rounds and security cameras for their safety. Patient instructed to come to me with needs or concerns. 

## 2019-07-17 NOTE — ED Notes (Signed)
IV fluids infusing patient continues to sleep. Awakens to name without difficulties.

## 2019-07-17 NOTE — ED Notes (Signed)
Call received at 1220 from [facility] [employer] awaiting to hear back from her in regards to Mr. [facility] plan of care for patient.

## 2019-07-17 NOTE — ED Notes (Signed)
When Rn taking vitals, liquid noted on floor beside bed, floor and bed linen soiled, possibly from pt urinating beside bed into floor.

## 2019-07-17 NOTE — ED Notes (Signed)
Hourly rounding reveals patient in room after going to rest room. No complaints, stable, in no acute distress. Q15 minute rounds and monitoring via [employer] to continue.

## 2019-07-17 NOTE — ED Notes (Signed)
B/P med held due to hypotension. Dr, Corky Downs made aware, bolus ns ordered and administered will monitor and reassess.

## 2019-07-17 NOTE — Progress Notes (Signed)
Luis Cooper is a 78 y.o. male patient presented to [facility] ED earlier in the day due to his aggressive behaviors and delusional thinking he was unable to be assess.  Due to the patient aggressive behavior he had to be medicated via IM injections.  He was given 5 mg Haldol IM, 2 mg Ativan IM and 25 mg Benadryl IM.  On several occasions he was redirected refuse to go back to his room and began yelling and threatening staff.

## 2019-07-17 NOTE — ED Notes (Signed)
Pt bedding and clothing changed at this time. Pt very lethargic when attempting to arouse pt to assist in care. Pt was able to eventually sit up to put shirt on, but as soon as he laid back down he went back to sleep

## 2019-07-17 NOTE — Consult Note (Signed)
[facility] Face-to-Face Psychiatry Consult   Reason for Consult: Aggressive behavior Referring Physician: Dr. Larinda Buttery Patient Identification: Luis Cooper MRN:  [id number] Principal Diagnosis: Bipolar affective disorder, current episode manic (HCC) Diagnosis:  Principal Problem:   Bipolar affective disorder, current episode manic (HCC) Active Problems:   Diabetes mellitus without complication (HCC)   Gout   Bipolar 1 disorder (HCC)   Adjustment disorder   Total Time spent with patient: 45 minutes  Subjective: "Look here, do not work away from me until I am done talking to you.  Do you hear me?" [name] is a 78 y.o. male patient presented to [facility] ED earlier in the day due to his aggressive behaviors and delusional thinking he was unable to be assess. The patient had been aggressive on the unit, threatening staff, speaking nonsensically. The patient lab results show him negative for all substances or alcohol use.  Even though on his initial assessment, he voiced that he had been smoking marijuana and drinking alcohol, which has been false from his lab results.  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 The patient was seen face-to-face by this provider; chart reviewed and consulted with Dr. Colon Branch on 07/16/2019 due to the patient's care. It was discussed with the EDP that the patient does meet the criteria to be admitted to the geriatric psychiatric inpatient unit or a memory care facility.  The patient is alert and oriented x 1, aggressive, threatening staff and uncooperative, and mood-congruent with affect on evaluation. The patient does not appear to be responding to internal or external stimuli. He is presenting with delusional thinking. The patient denies auditory or visual hallucinations. The  patient denies any suicidal, homicidal, or self-harm ideations. The patient is presenting with psychotic and paranoia behaviors. During an encounter with the patient, he was unable to answer most questions raised to him appropriately.  Plan: The patient is a safety risk to self, others, and does require geriatric psychiatric inpatient admission or a memory care facility for stabilization and treatment.  HPI: Per Dr. Larinda Buttery; Luis Cooper is a 78 y.o. male with past medical history of bipolar disorder and diabetes presents to the ED for aggressive behavior.  Patient found to be aggressive and uncooperative with the police and placed under IVC prior to arrival in the ED.  He states that he smoked a cigarette that contain marijuana, but denies any other drug use.  When asked if he has been drinking alcohol patient states "a little".  He repeatedly demands that I "watch the TV".  He states "I know the presidential codes."  He denies any homicidal ideation or suicidal ideation.  Past Psychiatric History:  Bipolar 1 disorder (HCC)  Risk to Self: Suicidal Ideation: Luis Cooper - UTA) Suicidal Intent: Luis Cooper - UTA) Is patient at risk for suicide?: (UKN - UTA) Suicidal Plan?: (UKN - UTA) Access to Means: Luis Cooper - UTA) What has been your use of drugs/alcohol within the last 12 months?: (UKN - UTA) How many times?: Luis Cooper - UTA) Other Self Harm Risks: Luis Cooper - UTA) Triggers for Past Attempts: (UKN - UTA) Intentional Self Injurious Behavior: (UKN - UTA) Risk to Others: Homicidal Ideation: Luis Cooper - UTA) Thoughts of Harm to Others: Luis Cooper - UTA) Current Homicidal Intent: Luis Cooper - UTA) Current Homicidal Plan: Luis Cooper -  UTA) Access to Homicidal Means: Luis Cooper - UTA) Identified Victim: Luis Cooper - UTA) History of harm to others?Luis Cooper Luis Cooper - UTA) Assessment of Violence: Luis Cooper - UTA) Violent Behavior Description: Luis Cooper - UTA) Does patient have access to weapons?: Luis Cooper - Rich Reining) Criminal Charges Pending?: Luis Cooper - UTA) Does patient have a court  date: Luis Cooper - UTA) Prior Inpatient Therapy: Prior Inpatient Therapy: Luis Cooper [facility]) Prior Outpatient Therapy: Prior Outpatient Therapy: Luis Cooper [facility])  Past Medical History:  Past Medical History:  Diagnosis Date  . Bipolar 1 disorder (HCC)   . Diabetes mellitus without complication (HCC)   . Gout    History reviewed. No pertinent surgical history. Family History: No family history on file. Family Psychiatric  History: Unknown Social History:  Social History   Substance and Sexual Activity  Alcohol Use None     Social History   Substance and Sexual Activity  Drug Use Not on file    Social History   Socioeconomic History  . Marital status: Married    Spouse name: Not on file  . Number of children: Not on file  . Years of education: Not on file  . Highest education level: Not on file  Occupational History  . Not on file  Social Needs  . Financial resource strain: Not on file  . Food insecurity    Worry: Not on file    Inability: Not on file  . Transportation needs    Medical: Not on file    Non-medical: Not on file  Tobacco Use  . Smoking status: Current Every Day Smoker  . Smokeless tobacco: Never Used  Substance and Sexual Activity  . Alcohol use: Not on file  . Drug use: Not on file  . Sexual activity: Not on file  Lifestyle  . Physical activity    Days per week: Not on file    Minutes per session: Not on file  . Stress: Not on file  Relationships  . Social Musician on phone: Not on file    Gets together: Not on file    Attends religious service: Not on file    Active member of club or organization: Not on file    Attends meetings of clubs or organizations: Not on file    Relationship status: Not on file  Other Topics Concern  . Not on file  Social History Narrative  . Not on file   Additional Social History:    Allergies:  No Known Allergies  Labs:  Results for orders placed or performed during the hospital encounter of 07/16/19 (from  the past 48 hour(s))  Comprehensive metabolic panel     Status: Abnormal   Collection Time: 07/16/19 10:12 AM  Result Value Ref Range   Sodium 133 (L) 135 - 145 mmol/L   Potassium 3.6 3.5 - 5.1 mmol/L   Chloride 99 98 - 111 mmol/L   CO2 18 (L) 22 - 32 mmol/L   Glucose, Bld 179 (H) 70 - 99 mg/dL   BUN 24 (H) 8 - 23 mg/dL   Creatinine, Ser 4.09 (H) 0.61 - 1.24 mg/dL   Calcium 81.1 8.9 - 91.4 mg/dL   Total Protein 8.2 (H) 6.5 - 8.1 g/dL   Albumin 4.4 3.5 - 5.0 g/dL   AST 55 (H) 15 - 41 U/L   ALT 34 0 - 44 U/L   Alkaline Phosphatase 91 38 - 126 U/L   Total Bilirubin 1.2 0.3 - 1.2 mg/dL   GFR calc non  Af Amer 46 (L) >60 mL/min   GFR calc Af Amer 53 (L) >60 mL/min   Anion gap 16 (H) 5 - 15    Comment: Performed at [facility], [address] [place], [place] [address]  CBC with Differential     Status: None   Collection Time: 07/16/19 10:12 AM  Result Value Ref Range   WBC 9.4 4.0 - 10.5 K/uL   RBC 4.82 4.22 - 5.81 MIL/uL   Hemoglobin 15.0 13.0 - 17.0 g/dL   HCT 44.0 39.0 - 52.0 %   MCV 91.3 80.0 - 100.0 fL   MCH 31.1 26.0 - 34.0 pg   MCHC 34.1 30.0 - 36.0 g/dL   RDW 13.0 11.5 - 15.5 %   Platelets 323 150 - 400 K/uL   nRBC 0.0 0.0 - 0.2 %   Neutrophils Relative % 79 %   Neutro Abs 7.5 1.7 - 7.7 K/uL   Lymphocytes Relative 12 %   Lymphs Abs 1.1 0.7 - 4.0 K/uL   Monocytes Relative 7 %   Monocytes Absolute 0.7 0.1 - 1.0 K/uL   Eosinophils Relative 1 %   Eosinophils Absolute 0.1 0.0 - 0.5 K/uL   Basophils Relative 1 %   Basophils Absolute 0.1 0.0 - 0.1 K/uL   Immature Granulocytes 0 %   Abs Immature Granulocytes 0.02 0.00 - 0.07 K/uL    Comment: Performed at [facility], [place], [place] [address]  Acetaminophen level     Status: Abnormal   Collection Time: 07/16/19 10:12 AM  Result Value Ref Range   Acetaminophen (Tylenol), Serum <10 (L) 10 - 30 ug/mL    Comment: (NOTE) Therapeutic concentrations vary significantly. A range of 10-30  ug/mL  may be an effective concentration for many patients. However, some  are best treated at concentrations outside of this range. Acetaminophen concentrations >150 ug/mL at 4 hours after ingestion  and >50 ug/mL at 12 hours after ingestion are often associated with  toxic reactions. Performed at [facility], [address] [place], [place] [address]   Salicylate level     Status: None   Collection Time: 07/16/19 10:12 AM  Result Value Ref Range   Salicylate Lvl <7.8 2.8 - 30.0 mg/dL    Comment: Performed at [facility], [address] [place], [place] [address]  Ethanol     Status: None   Collection Time: 07/16/19 10:12 AM  Result Value Ref Range   Alcohol, Ethyl (B) <10 <10 mg/dL    Comment: (NOTE) Lowest detectable limit for serum alcohol is 10 mg/dL. For medical purposes only. Performed at [facility], [address] [place],  [address]   SARS Coronavirus 2 [facility] order, Performed in [facility] hospital lab) Nasopharyngeal Nasopharyngeal Swab     Status: None   Collection Time: 07/16/19 11:14 AM   Specimen: Nasopharyngeal Swab  Result Value Ref Range   SARS Coronavirus 2 NEGATIVE NEGATIVE    Comment: (NOTE) If result is NEGATIVE SARS-CoV-2 target nucleic acids are NOT DETECTED. The SARS-CoV-2 RNA is generally detectable in upper and lower  respiratory specimens during the acute phase of infection. The lowest  concentration of SARS-CoV-2 viral copies this assay can detect is 250  copies / mL. A negative result does not preclude SARS-CoV-2 infection  and should not be used as the sole basis for treatment or other  patient management decisions.  A negative result may occur with  improper specimen collection / handling, submission  of specimen other  than nasopharyngeal swab, presence of viral mutation(s) within the  areas targeted by this assay, and inadequate number of viral copies  (<250 copies / mL). A negative result must  be combined with clinical  observations, patient history, and epidemiological information. If result is POSITIVE SARS-CoV-2 target nucleic acids are DETECTED. The SARS-CoV-2 RNA is generally detectable in upper and lower  respiratory specimens dur ing the acute phase of infection.  Positive  results are indicative of active infection with SARS-CoV-2.  Clinical  correlation with patient history and other diagnostic information is  necessary to determine patient infection status.  Positive results do  not rule out bacterial infection or co-infection with other viruses. If result is PRESUMPTIVE POSTIVE SARS-CoV-2 nucleic acids MAY BE PRESENT.   A presumptive positive result was obtained on the submitted specimen  and confirmed on repeat testing.  While 2019 novel coronavirus  (SARS-CoV-2) nucleic acids may be present in the submitted sample  additional confirmatory testing may be necessary for epidemiological  and / or clinical management purposes  to differentiate between  SARS-CoV-2 and other Sarbecovirus currently known to infect humans.  If clinically indicated additional testing with an alternate test  methodology [id number]) is advised. The SARS-CoV-2 RNA is generally  detectable in upper and lower respiratory sp ecimens during the acute  phase of infection. The expected result is Negative. Fact Sheet for Patients:  BoilerBrush.com.cy Fact Sheet for Healthcare Providers: https://pope.com/ This test is not yet approved or cleared by the [place] FDA and has been authorized for detection and/or diagnosis of SARS-CoV-2 by FDA under an Emergency Use Authorization (EUA).  This EUA will remain in effect (meaning this test can be used) for the duration of the COVID-19 declaration under Section 564(b)(1) of the Act, 21 U.S.C. section 360bbb-3(b)(1), unless the authorization is terminated or revoked sooner. Performed at [facility], [address] [place], [place] [address]   Urinalysis, Complete w Microscopic     Status: Abnormal   Collection Time: 07/16/19  1:52 PM  Result Value Ref Range   Color, Urine YELLOW (A) YELLOW   APPearance CLEAR (A) CLEAR   Specific Gravity, Urine 1.009 1.005 - 1.030   pH 5.0 5.0 - 8.0   Glucose, UA NEGATIVE NEGATIVE mg/dL   Hgb urine dipstick NEGATIVE NEGATIVE   Bilirubin Urine NEGATIVE NEGATIVE   Ketones, ur 5 (A) NEGATIVE mg/dL   Protein, ur NEGATIVE NEGATIVE mg/dL   Nitrite NEGATIVE NEGATIVE   Leukocytes,Ua NEGATIVE NEGATIVE   RBC / HPF 0-5 0 - 5 RBC/hpf   WBC, UA 0-5 0 - 5 WBC/hpf   Bacteria, UA NONE SEEN NONE SEEN   Squamous Epithelial / LPF NONE SEEN 0 - 5   Mucus PRESENT     Comment: Performed at [facility], [address] [place], [place] [address]  Urine Drug Screen, Qualitative     Status: None   Collection Time: 07/16/19  2:07 PM  Result Value Ref Range   Tricyclic, Ur Screen NONE DETECTED NONE DETECTED   Amphetamines, Ur Screen NONE DETECTED NONE DETECTED   MDMA (Ecstasy)Ur Screen NONE DETECTED NONE DETECTED   Cocaine Metabolite,Ur [place] NONE DETECTED NONE DETECTED   Opiate, Ur Screen NONE DETECTED NONE DETECTED   Phencyclidine (PCP) Ur S NONE DETECTED NONE DETECTED   Cannabinoid 50 Ng, Ur [place] NONE DETECTED NONE DETECTED   Barbiturates, Ur Screen NONE DETECTED NONE DETECTED   Benzodiazepine, Ur Scrn NONE DETECTED NONE DETECTED   Methadone  Scn, Ur NONE DETECTED NONE DETECTED    Comment: (NOTE) Tricyclics + metabolites, urine    Cutoff 1000 ng/mL Amphetamines + metabolites, urine  Cutoff 1000 ng/mL MDMA (Ecstasy), urine              Cutoff 500 ng/mL Cocaine Metabolite, urine          Cutoff 300 ng/mL Opiate + metabolites, urine        Cutoff 300 ng/mL Phencyclidine (PCP), urine         Cutoff 25 ng/mL Cannabinoid, urine                 Cutoff 50 ng/mL Barbiturates + metabolites, urine  Cutoff 200 ng/mL Benzodiazepine, urine              Cutoff  200 ng/mL Methadone, urine                   Cutoff 300 ng/mL The urine drug screen provides only a preliminary, unconfirmed analytical test result and should not be used for non-medical purposes. Clinical consideration and professional judgment should be applied to any positive drug screen result due to possible interfering substances. A more specific alternate chemical method must be used in order to obtain a confirmed analytical result. Gas chromatography / mass spectrometry (GC/MS) is the preferred confirmat ory method. Performed at [facility], [address], [place], [place] [address]     Current Facility-Administered Medications  Medication Dose Route Frequency Provider Last Rate Last Dose  . amLODipine (NORVASC) tablet 10 mg  10 mg Oral Daily Chesley Noon, MD   10 mg at 07/16/19 1715  . atorvastatin (LIPITOR) tablet 20 mg  20 mg Oral q1800 Chesley Noon, MD   20 mg at 07/16/19 1715  . divalproex (DEPAKOTE) DR tablet 250 mg  250 mg Oral Q12H Chesley Noon, MD   250 mg at 07/16/19 2101  . losartan (COZAAR) tablet 25 mg  25 mg Oral Daily Chesley Noon, MD   25 mg at 07/16/19 1715  . metFORMIN (GLUCOPHAGE) tablet 500 mg  500 mg Oral BID WC Chesley Noon, MD       Current Outpatient Medications  Medication Sig Dispense Refill  . amLODipine (NORVASC) 10 MG tablet Take 10 mg by mouth daily.    Luis Cooper atorvastatin (LIPITOR) 20 MG tablet Take 20 mg by mouth daily at 6 PM.   3  . divalproex (DEPAKOTE) 250 MG DR tablet Take 250 mg by mouth every 12 (twelve) hours.   3  . losartan (COZAAR) 25 MG tablet Take 25 mg by mouth.     . metFORMIN (GLUCOPHAGE) 500 MG tablet Take 1,000 mg by mouth 2 (two) times daily with a meal.      Musculoskeletal: Strength & Muscle Tone: decreased Gait & Station: normal Patient leans: N/A  Psychiatric Specialty Exam: Physical Exam  Nursing note and vitals reviewed. Constitutional: He appears well-developed.  Neck: Normal range of  motion. Neck supple.  Respiratory: Effort normal.  Musculoskeletal: Normal range of motion.  Neurological: He is alert.    Review of Systems  Psychiatric/Behavioral: Positive for hallucinations. The patient is nervous/anxious.   All other systems reviewed and are negative.   Blood pressure 138/87, pulse (!) 115, temperature 98 F (36.7 C), temperature source Oral, resp. rate 18, height  (1.575 m), weight 54.4 kg, SpO2 97 %.Body mass index is 21.95 kg/m.  General Appearance: Bizarre and Disheveled  Eye Contact:  Good  Speech:  Pressured and Delusional  Volume:  Increased  Mood:  Angry, Anxious, Euphoric and Irritable  Affect:  Non-Congruent, Inappropriate and Full Range  Thought Process:  Disorganized  Orientation:  Other:  To self   Thought Content:  Illogical, Delusions, Paranoid Ideation and Abstract Reasoning  Suicidal Thoughts:  No  Homicidal Thoughts:  No  Memory:  Immediate;   Poor Recent;   Poor Remote;   Poor  Judgement:  Impaired  Insight:  Lacking  Psychomotor Activity:  Decreased  Concentration:  Concentration: Good and Attention Span: Good  Recall:  Poor  Fund of Knowledge:  Poor  Language:  Poor  Akathisia:  Negative  Handed:  Right  AIMS (if indicated):     Assets:  Housing Social Support  ADL's:  Intact  Cognition:  Impaired,  Severe  Sleep:   Insomnia     Treatment Plan Summary: Medication management and Plan Patient meets criteria for geriatric psychiatric unit or for a memory care facility.  Disposition: Recommend psychiatric Inpatient admission when medically cleared. Supportive therapy provided about ongoing stressors.  Gillermo MurdochJacqueline Thompson, NP 07/17/2019 4:31 AM

## 2019-07-17 NOTE — ED Notes (Signed)
Hourly rounding reveals patient in room. Stable, in no acute distress. Q15 minute rounds and monitoring via Security Cameras to continue. 

## 2019-07-17 NOTE — ED Notes (Signed)
Dinner offered patient declined. awakens easily to name. Vss. 1 lt normal saline completed. Will monitor. Safety maintained.

## 2019-07-17 NOTE — ED Notes (Signed)
Call received from [place] [facility] [employer] requesting copy of mr. Nanez IVC papers, secretary called fax number provided will fax over to # [phone].

## 2019-07-17 NOTE — BH Assessment (Signed)
Patient has been accepted to [facility].  Patient assigned to [facility] Accepting physician is Dr. Jonelle Sports.  Call report to [phone].  Representative was [employer].   ER Staff is aware of it:  Lattie Haw, ER Secretary  Dr. Corky Downs, ER MD  Tomasa Hosteller, Patient's Nurse   *Patient can be transported to accepting facility tomorrow - 07/18/2019.

## 2019-07-17 NOTE — ED Notes (Signed)
Pt given lunch tray and a sprite. 

## 2019-07-17 NOTE — ED Provider Notes (Signed)
-----------------------------------------   5:49 AM on 07/17/2019 -----------------------------------------   Blood pressure 138/87, pulse (!) 115, temperature 98 F (36.7 C), temperature source Oral, resp. rate 18, height 5\' 2"  (1.575 m), weight 54.4 kg, SpO2 97 %.  The patient is sleeping at this time.  There have been no acute events since the last update.  Awaiting disposition plan from Behavioral Medicine and/or Social Work team(s).   Paulette Blanch, MD 07/17/19 [phone]

## 2019-07-17 NOTE — ED Notes (Signed)
Patient incontinent  Of urine, patient up to chair, peri care and clean dry clothes provided. , linen changed. Patient out of room to day room eating dinner.

## 2019-07-18 NOTE — ED Notes (Signed)
Hourly rounding reveals patient sleeping in room. No complaints, stable, in no acute distress. Q15 minute rounds and monitoring via Security Cameras to continue. 

## 2019-07-18 NOTE — ED Notes (Signed)
BEHAVIORAL HEALTH ROUNDING Patient sleeping: No. Patient alert and oriented: yes Behavior appropriate: Yes.  ; If no, describe:  Nutrition and fluids offered: yes Toileting and hygiene offered: Yes  Sitter present: q15 minute observations and security monitoring Law enforcement present: Yes    

## 2019-07-18 NOTE — ED Provider Notes (Signed)
-----------------------------------------   4:04 AM on 07/18/2019 -----------------------------------------   Blood pressure 125/73, pulse 60, temperature 98.9 F (37.2 C), temperature source Oral, resp. rate 16, height 5\' 2"  (1.575 m), weight 54.4 kg, SpO2 100 %.  The patient had no acute events since last update.  Calm and cooperative at this time.  Disposition is pending per Psychiatry/Behavioral Medicine team recommendations.     Alfred Levins, [place], MD 07/18/19 [phone]

## 2019-07-18 NOTE — ED Notes (Signed)
Patient transferred to [facility], patient and sheriff received discharge papers. Patient received belongings and verbalized he has received all of his belongings. Patient appropriate and cooperative, Denies SI/HI AVH. Vital signs taken. NAD noted.

## 2020-04-09 ENCOUNTER — Telehealth: Payer: Self-pay | Admitting: General Practice

## 2020-04-09 NOTE — Telephone Encounter (Signed)
No phone number on file. Can not be contacted regarding ED referral.

## 2020-04-14 ENCOUNTER — Other Ambulatory Visit: Payer: Self-pay

## 2020-04-14 DIAGNOSIS — Z79899 Other long term (current) drug therapy: Secondary | ICD-10-CM | POA: Diagnosis not present

## 2020-04-14 DIAGNOSIS — R4182 Altered mental status, unspecified: Secondary | ICD-10-CM | POA: Diagnosis present

## 2020-04-14 DIAGNOSIS — F3160 Bipolar disorder, current episode mixed, unspecified: Secondary | ICD-10-CM | POA: Insufficient documentation

## 2020-04-14 DIAGNOSIS — F17210 Nicotine dependence, cigarettes, uncomplicated: Secondary | ICD-10-CM | POA: Diagnosis not present

## 2020-04-14 DIAGNOSIS — E119 Type 2 diabetes mellitus without complications: Secondary | ICD-10-CM | POA: Diagnosis not present

## 2020-04-14 DIAGNOSIS — R451 Restlessness and agitation: Secondary | ICD-10-CM | POA: Insufficient documentation

## 2020-04-14 DIAGNOSIS — F29 Unspecified psychosis not due to a substance or known physiological condition: Secondary | ICD-10-CM | POA: Diagnosis not present

## 2020-04-14 DIAGNOSIS — M25551 Pain in right hip: Secondary | ICD-10-CM | POA: Diagnosis not present

## 2020-04-14 DIAGNOSIS — Z20822 Contact with and (suspected) exposure to covid-19: Secondary | ICD-10-CM | POA: Insufficient documentation

## 2020-04-14 DIAGNOSIS — W19XXXA Unspecified fall, initial encounter: Secondary | ICD-10-CM | POA: Insufficient documentation
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
## 2020-04-15 ENCOUNTER — Emergency Department
Admission: EM | Admit: 2020-04-15 | Discharge: 2020-04-23 | Disposition: A | Discharge status: Home / Self Care | Payer: Medicare Other | Attending: Student in an Organized Health Care Education/Training Program | Admitting: Student in an Organized Health Care Education/Training Program

## 2020-04-15 ENCOUNTER — Emergency Department: Payer: Medicare Other

## 2020-04-15 DIAGNOSIS — R451 Restlessness and agitation: Secondary | ICD-10-CM

## 2020-04-15 DIAGNOSIS — Z008 Encounter for other general examination: Secondary | ICD-10-CM

## 2020-04-15 DIAGNOSIS — W19XXXA Unspecified fall, initial encounter: Secondary | ICD-10-CM

## 2020-04-15 LAB — CBC WITH DIFFERENTIAL/PLATELET
Abs Immature Granulocytes: 0.03 10*3/uL (ref 0.00–0.07)
Basophils Absolute: 0 10*3/uL (ref 0.0–0.1)
Basophils Relative: 1 %
Eosinophils Absolute: 0.1 10*3/uL (ref 0.0–0.5)
Eosinophils Relative: 2 %
HCT: 40.5 % (ref 39.0–52.0)
Hemoglobin: 14.2 g/dL (ref 13.0–17.0)
Immature Granulocytes: 1 %
Lymphocytes Relative: 12 %
Lymphs Abs: 0.7 10*3/uL (ref 0.7–4.0)
MCH: 31.7 pg (ref 26.0–34.0)
MCHC: 35.1 g/dL (ref 30.0–36.0)
MCV: 90.4 fL (ref 80.0–100.0)
Monocytes Absolute: 0.5 10*3/uL (ref 0.1–1.0)
Monocytes Relative: 8 %
Neutro Abs: 4.6 10*3/uL (ref 1.7–7.7)
Neutrophils Relative %: 76 %
Platelets: 162 10*3/uL (ref 150–400)
RBC: 4.48 MIL/uL (ref 4.22–5.81)
RDW: 13.7 % (ref 11.5–15.5)
WBC: 5.9 10*3/uL (ref 4.0–10.5)
nRBC: 0 % (ref 0.0–0.2)

## 2020-04-15 LAB — COMPREHENSIVE METABOLIC PANEL
ALT: 26 U/L (ref 0–44)
AST: 44 U/L — ABNORMAL HIGH (ref 15–41)
Albumin: 4.3 g/dL (ref 3.5–5.0)
Alkaline Phosphatase: 83 U/L (ref 38–126)
Anion gap: 10 (ref 5–15)
BUN: 21 mg/dL (ref 8–23)
CO2: 27 mmol/L (ref 22–32)
Calcium: 9.6 mg/dL (ref 8.9–10.3)
Chloride: 104 mmol/L (ref 98–111)
Creatinine, Ser: 1.31 mg/dL — ABNORMAL HIGH (ref 0.61–1.24)
GFR calc Af Amer: >60 mL/min (ref >60)
GFR calc non Af Amer: 52 mL/min — ABNORMAL LOW (ref >60)
Glucose, Bld: 170 mg/dL — ABNORMAL HIGH (ref 70–99)
Potassium: 3.7 mmol/L (ref 3.5–5.1)
Sodium: 141 mmol/L (ref 135–145)
Total Bilirubin: 1.7 mg/dL — ABNORMAL HIGH (ref 0.3–1.2)
Total Protein: 7.8 g/dL (ref 6.5–8.1)

## 2020-04-15 LAB — URINE DRUG SCREEN, QUALITATIVE (ARMC ONLY)
Amphetamines, Ur Screen: NOT DETECTED
Barbiturates, Ur Screen: NOT DETECTED
Benzodiazepine, Ur Scrn: NOT DETECTED
Cannabinoid 50 Ng, Ur ~~LOC~~: NOT DETECTED
Cocaine Metabolite,Ur ~~LOC~~: NOT DETECTED
MDMA (Ecstasy)Ur Screen: NOT DETECTED
Methadone Scn, Ur: NOT DETECTED
Opiate, Ur Screen: NOT DETECTED
Phencyclidine (PCP) Ur S: NOT DETECTED
Tricyclic, Ur Screen: NOT DETECTED

## 2020-04-15 LAB — ETHANOL: Alcohol, Ethyl (B): <10 mg/dL (ref <10)

## 2020-04-15 MED ORDER — BENZTROPINE MESYLATE 1 MG PO TABS
0.5000 mg | ORAL_TABLET | Freq: Two times a day (BID) | ORAL | Status: DC
  Administered 2020-04-15 (×2): 0.5 mg via ORAL
  Filled 2020-04-15 (×3): qty 1

## 2020-04-15 MED ORDER — OLANZAPINE 5 MG PO TABS
7.5000 mg | ORAL_TABLET | Freq: Every day | ORAL | Status: DC
  Administered 2020-04-15: 7.5 mg via ORAL
  Filled 2020-04-15: qty 2

## 2020-04-15 MED ORDER — DIVALPROEX SODIUM 500 MG PO DR TAB
500.0000 mg | DELAYED_RELEASE_TABLET | Freq: Two times a day (BID) | ORAL | Status: DC
  Administered 2020-04-15 – 2020-04-23 (×16): 500 mg via ORAL
  Filled 2020-04-15 (×16): qty 1

## 2020-04-15 MED ORDER — AMLODIPINE BESYLATE 5 MG PO TABS
10.0000 mg | ORAL_TABLET | Freq: Every day | ORAL | Status: DC
  Administered 2020-04-15 – 2020-04-23 (×9): 10 mg via ORAL
  Filled 2020-04-15 (×9): qty 2

## 2020-04-15 MED ORDER — POLYETHYLENE GLYCOL 3350 17 G PO PACK
17.0000 g | PACK | Freq: Every day | ORAL | Status: DC
  Administered 2020-04-16 – 2020-04-22 (×7): 17 g via ORAL
  Filled 2020-04-15 (×7): qty 1

## 2020-04-15 MED ORDER — HALOPERIDOL 0.5 MG PO TABS
0.5000 mg | ORAL_TABLET | Freq: Two times a day (BID) | ORAL | Status: DC
  Administered 2020-04-15 (×2): 0.5 mg via ORAL
  Filled 2020-04-15 (×5): qty 1

## 2020-04-15 NOTE — ED Notes (Signed)
Pt given breakfast tray

## 2020-04-15 NOTE — ED Notes (Signed)
Pt is laying in bed. NAD noted. Pt appears to be sleeping.
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
## 2020-04-15 NOTE — ED Notes (Signed)

## 2020-04-15 NOTE — ED Notes (Signed)
Patient continues to refuse to give blood, he said "yall aint taking my damn blood look at the charts, yall already have enough"
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
## 2020-04-15 NOTE — ED Notes (Signed)
IVC, pending reassessment by TTS in AM
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
## 2020-04-15 NOTE — ED Notes (Signed)
Pt very unsteady on feet. Pt has felt like he needed to have a bowel movement. First try was unsuccessful on the toilet in room. Pt called out stating he needed to try and use the bathroom again. He asked if he could use a bedside toilet. Tech retrieved bedside and gave pt something to rest his feet on. Pt was about to have a large bowel movement after sitting on toilet for a bit. He was also able to stand and wipe himself. Pt needed assistance getting back into the bed. Pt continues to speak rudely to staff with no appreciation for help offered.   lw edt

## 2020-04-16 MED ORDER — OLANZAPINE 5 MG PO TABS
5.0000 mg | ORAL_TABLET | Freq: Every day | ORAL | Status: DC
  Administered 2020-04-16: 5 mg via ORAL
  Administered 2020-04-17: 2.5 mg via ORAL
  Administered 2020-04-18 – 2020-04-21 (×4): 5 mg via ORAL
  Filled 2020-04-16 (×6): qty 1

## 2020-04-16 MED ORDER — BENZTROPINE MESYLATE 1 MG PO TABS
0.5000 mg | ORAL_TABLET | Freq: Every day | ORAL | Status: DC
  Administered 2020-04-17 – 2020-04-21 (×5): 0.5 mg via ORAL
  Filled 2020-04-16 (×5): qty 1

## 2020-04-16 MED ORDER — HALOPERIDOL 0.5 MG PO TABS
0.5000 mg | ORAL_TABLET | Freq: Every day | ORAL | Status: DC
  Administered 2020-04-17 – 2020-04-21 (×5): 0.5 mg via ORAL
  Filled 2020-04-16 (×7): qty 1

## 2020-04-16 NOTE — ED Notes (Signed)
Pt given cup of water by this tech.

## 2020-04-16 NOTE — ED Notes (Signed)
Pt sleeping at this time.

## 2020-04-16 NOTE — ED Notes (Signed)
Pt was unable to change himself. RN and nurse tech changed pt. Pt did not want pants on. Pt got new sheets and linen and hospital approved underwear.

## 2020-04-16 NOTE — ED Notes (Signed)
Pt urinated, while trying to use the urinal, he did get some urine on his pants. RN provided new pants and asked if he wanted help. Pt stated he would do it and asked RN to leave.

## 2020-04-16 NOTE — ED Notes (Addendum)
Luis Cooper  Luis Cooper [phone]  Updated on pt status and condition, was told they will call back with questions
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
## 2020-04-16 NOTE — ED Notes (Signed)
Patient continues to yell out when he needs something, due to not having a call bell in room

## 2020-04-16 NOTE — BH Assessment (Addendum)
Referral information for Psychiatric Hospitalization faxed to:   [facility] ([phone]---[phone])   [place] [phone] or [phone]),   . [name] [phone] -or- [phone]),   . [facility] [phone]),
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
## 2020-04-16 NOTE — ED Notes (Signed)
Pt provided with meal tray and remote. Pt calm and cooperative at this time

## 2020-04-16 NOTE — ED Provider Notes (Signed)
Emergency Medicine Observation Re-evaluation Note  Torell Minder is a 79 y.o. male, seen on rounds today.  Pt initially presented to the ED for complaints of Fall Currently, the patient is asleep but awakes to verbal stimuli. Denies any other medical concerns this morning.   Physical Exam  BP 122/76 (BP Location: Left Arm)   Pulse (!) 101   Temp 98.3 F (36.8 C) (Oral)   Resp 18   Ht 5\' 2"  (1.575 m)   Wt 64 kg   SpO2 97%   BMI 25.81 kg/m  Physical Exam  Physical Exam General: No apparent distress HEENT: moist mucous membranes CV: RRR Pulm: Normal WOB GI: soft and non tender MSK: no edema or cyanosis Neuro: face symmetric, moving all extremities     ED Course / MDM  EKG:  Clinical Course as of Apr 17 735  Tue Apr 15, 2020  0615 Patient becoming increasingly agitated and combative towards staff.  Informed him that that behavior would not be tolerated however in further discussion with him I am concerned that he is acutely psychotic.  Possibly secondary to substance abuse but patient refusing blood work right now.  CT head without any evidence of acute intracranial abnormality.  Does have a history of substance abuse as well as bipolar disorder.  He is otherwise hemodynamically stable does consent to UDS.  But given his aggressive behavior towards bystanders his erratic behavior here in the ER with history of bipolar disorder I do believe will require IVC and psychiatric consultation and further observation here in the ER.   [PR]  0616 CT   [PR]    Clinical Course User Index [PR] 5993, MD   I have reviewed the labs performed to date as well as medications administered while in observation.  Recent changes in the last 24 hours include none.   Plan  Current plan is for re-access by psych  Patient is under full IVC at this time.   Willy Eddy, MD 04/16/20 [phone]

## 2020-04-16 NOTE — BH Assessment (Signed)
TTS completed reassessment. Pt presents restless, disoriented with a incoherent thought process. Pt was hard to understand due to mumbling. TTS is currently unable to complete reassessment with pt due to his incoherent presentation.    Per Dr. Smith Robert pt meets criteria for INPT Rolena Infante)

## 2020-04-17 ENCOUNTER — Other Ambulatory Visit: Payer: Self-pay

## 2020-04-17 ENCOUNTER — Emergency Department: Payer: Medicare Other

## 2020-04-17 LAB — URINALYSIS, COMPLETE (UACMP) WITH MICROSCOPIC
Bacteria, UA: NONE SEEN
Bilirubin Urine: NEGATIVE
Glucose, UA: 50 mg/dL — AB
Hgb urine dipstick: NEGATIVE
Ketones, ur: 5 mg/dL — AB
Leukocytes,Ua: NEGATIVE
Nitrite: NEGATIVE
Protein, ur: NEGATIVE mg/dL
Specific Gravity, Urine: 1.009 (ref 1.005–1.030)
Squamous Epithelial / HPF: NONE SEEN (ref 0–5)
pH: 6 (ref 5.0–8.0)

## 2020-04-17 NOTE — ED Notes (Signed)
Pt given cup of water 

## 2020-04-17 NOTE — ED Notes (Signed)
Pt given apple juice as requested. Denies any other needs.

## 2020-04-17 NOTE — ED Notes (Signed)
Pt asleep. Bed locked low. Rails up.  

## 2020-04-17 NOTE — BH Assessment (Signed)
Referral check for Psychiatric Hospitalization:       Luis Cooper ([phone])- Denied due to pt not meeting criteria.     [place] [phone] or [phone] under review per Amy.  Luis Cooper [phone] -or- [phone] Romeo Apple, denied due to medical needs which is exclusionary.

## 2020-04-17 NOTE — ED Notes (Addendum)
Pt alert. Denies needs. Bed locked low. Rail up. NT to complete vitals soon.

## 2020-04-17 NOTE — ED Notes (Signed)
Dinner tray given to pt by dietary staff.

## 2020-04-17 NOTE — ED Provider Notes (Signed)
Emergency Medicine Observation Re-evaluation Note  Luis Cooper is a 79 y.o. male, seen on rounds today.  Pt initially presented to the ED for complaints of Fall Currently, the patient is calm, resting.  Physical Exam  BP 131/60 (BP Location: Right Arm)   Pulse 88   Temp 98.3 F (36.8 C) (Oral)   Resp 18   Ht 5\' 2"  (1.575 m)   Wt 64 kg   SpO2 96%   BMI 25.81 kg/m  Physical Exam  Gen: Resting in bed, wearing scrubs, NAD HEENT: NCAT Resp: Normal WOB in no resp distress GI: Non-distended Ext: Warm, well perfused, no deformity Neuro: Alert, no apparent focal deficits Psych: Calm, resting  ED Course / MDM  EKG:  Clinical Course as of Apr 17 546  Tue Apr 15, 2020  0615 Patient becoming increasingly agitated and combative towards staff.  Informed him that that behavior would not be tolerated however in further discussion with him I am concerned that he is acutely psychotic.  Possibly secondary to substance abuse but patient refusing blood work right now.  CT head without any evidence of acute intracranial abnormality.  Does have a history of substance abuse as well as bipolar disorder.  He is otherwise hemodynamically stable does consent to UDS.  But given his aggressive behavior towards bystanders his erratic behavior here in the ER with history of bipolar disorder I do believe will require IVC and psychiatric consultation and further observation here in the ER.   [PR]  0616 CT   [PR]    Clinical Course User Index [PR] 3244, MD   I have reviewed the labs performed to date as well as medications administered while in observation.  Recent changes in the last 24 hours include none. Plan  Current plan is for geripsych admission . Patient is under full IVC at this time.   Willy Eddy, MD 04/17/20 [phone]

## 2020-04-17 NOTE — ED Notes (Signed)
Pt resting in bed at this time. Lights remain on. NAD.

## 2020-04-17 NOTE — ED Notes (Signed)
Pt repositioned for mild back pain.

## 2020-04-17 NOTE — BH Assessment (Signed)
Referral check:  Luis Cooper [phone] or [phone])- Luis Cooper reports pt to be under review

## 2020-04-17 NOTE — ED Notes (Signed)
Pt resting calmly in bed. Bed locked low. Rails up.

## 2020-04-17 NOTE — ED Notes (Signed)
Pt ate everything on lunch tray. States he has urinated "4 times today".

## 2020-04-17 NOTE — ED Notes (Addendum)
Pt given apple sauce and cup of water. Pt denies any other needs. Pt repositioned self. Pt denies any thoughts or intentions to harm self or others. Pt remains cooperative/calm with this RN.

## 2020-04-17 NOTE — ED Notes (Addendum)
Pt alert and watching tv. Bed locked low. Rails up.

## 2020-04-17 NOTE — ED Notes (Addendum)
Pt resting in bed. Denies any needs. Bed locked low. Rails up.

## 2020-04-17 NOTE — ED Notes (Signed)
Pt c/o mild back pain. States he thinks it is from Doctor, general practice. Asked if he would like to be repositioned. Pt declined.

## 2020-04-18 LAB — SARS CORONAVIRUS 2 BY RT PCR (HOSPITAL ORDER, PERFORMED IN ~~LOC~~ HOSPITAL LAB): SARS Coronavirus 2: NEGATIVE

## 2020-04-18 NOTE — ED Notes (Signed)
Pt found attempting to stand up out of bed. Pt states he wanted to see if he could walk with his walker. This RN assisted pt to standing position with walker. Pt unable to ambulate and assisted back to bed.

## 2020-04-18 NOTE — ED Provider Notes (Signed)
Emergency Medicine Observation Re-evaluation Note  Bain Whichard is a 79 y.o. male, seen on rounds today.  Pt initially presented to the ED for complaints of Fall Currently, the patient is stable with no acute complaints.  Physical Exam  BP 102/60 (BP Location: Left Arm)   Pulse 93   Temp 97.9 F (36.6 C) (Oral)   Resp 18   Ht 5\' 2"  (1.575 m)   Wt 64 kg   SpO2 98%   BMI 25.81 kg/m  Physical Exam   General: No apparent distress HEENT: moist mucous membranes CV: RRR Pulm: Normal WOB GI: soft and non tender MSK: no edema or cyanosis Neuro: face symmetric, moving all extremities    ED Course / MDM   I have reviewed the labs performed to date as well as medications administered while in observation.  Over the last 24 hours patient had a urinalysis that did not show any signs of UTI. Plan  Current plan is for psychiatric disposition. Patient is under full IVC at this time.   , Don Perking, MD 04/18/20 [phone]

## 2020-04-18 NOTE — ED Notes (Signed)
Pt allowed visit from Mimi (friend).

## 2020-04-18 NOTE — ED Notes (Addendum)
This RN spoke with pt's brother Marquize Seib) with consent from pt and gave update. Phone given to patient to talk to his brother after update given. Brother added to emergency contacts with verbal okay from pt. Brother would like to be updated about pt's placement.

## 2020-04-18 NOTE — BH Assessment (Addendum)
Late Entry-  Requested information faxed to [place] [phone]). Pending review.

## 2020-04-18 NOTE — ED Notes (Addendum)
Pt instructed by Luis Joiner RN to call after done using restroom. Pt seen by Luis Cooper NT walking back to bed alone. D/t pt not ringing out when done with bathroom and pt's fall risk, pt informed that a staff member will be present when he has to use the restroom.

## 2020-04-19 NOTE — ED Notes (Signed)
Pt posey pad alarming. This RN into the room, pt sitting on the end of the bed. Pt states, "I am just trying to see if I can stand up." Pt given walker to help with standing. Pt stood for approx 30 seconds but unable to take any steps. Pt assisted back into bed.

## 2020-04-19 NOTE — ED Notes (Signed)
Report received from [name], Charity fundraiser. Pt resting comfortably watching TV in ED stretcher. ED stretcher locked and in lowest position. Call bell within reach. Pt is A&Ox4 and NAD at this time.

## 2020-04-19 NOTE — ED Notes (Signed)
Pt requested another chocolate ice cream, Paulino Rily said that he can have one with lunch. This edt informed pt

## 2020-04-19 NOTE — ED Notes (Signed)
Pt given water and graham crackers per request.  

## 2020-04-19 NOTE — ED Provider Notes (Signed)
Emergency Medicine Observation Re-evaluation Note  Luis Cooper is a 79 y.o. male, seen on rounds today.  Pt initially presented to the ED for complaints of Fall   Physical Exam  BP 130/65   Pulse (!) 110   Temp 98 F (36.7 C) (Axillary)   Resp 18   Ht 5\' 2"  (1.575 m)   Wt 64 kg   SpO2 98%   BMI 25.81 kg/m  Physical Exam Patient sleeping comfortably in bed. Constitutional: Comfortable in no acute distress Respiratory: Patient breathing normally no retractions ED Course / MDM  EKG:  Clinical Course as of Apr 19 750  Tue Apr 15, 2020  0615 Patient becoming increasingly agitated and combative towards staff.  Informed him that that behavior would not be tolerated however in further discussion with him I am concerned that he is acutely psychotic.  Possibly secondary to substance abuse but patient refusing blood work right now.  CT head without any evidence of acute intracranial abnormality.  Does have a history of substance abuse as well as bipolar disorder.  He is otherwise hemodynamically stable does consent to UDS.  But given his aggressive behavior towards bystanders his erratic behavior here in the ER with history of bipolar disorder I do believe will require IVC and psychiatric consultation and further observation here in the ER.   [PR]  0616 CT   [PR]    Clinical Course User Index [PR] 2947, MD   I have reviewed the labs performed to date as well as medications administered while in observation.  Recent changes in the last 24 hours include by report patient has been calm.  There was a report of agitation but that was 4 days ago. Plan  Disposition per psychiatry possible social work involvement   Willy Eddy, MD 04/19/20 [phone]

## 2020-04-19 NOTE — ED Notes (Signed)
Pt provided pillow under his knees per request in order to alleviate lumbar and leg pain.

## 2020-04-19 NOTE — BH Assessment (Addendum)
Referral check for Psychiatric Hospitalization:  Luis Cooper [phone] or [phone])- 6:49 pm Pt has been denied as he is deemed too acute, per [facility].

## 2020-04-20 NOTE — ED Notes (Signed)
Pt given dinner tray.

## 2020-04-20 NOTE — ED Notes (Signed)
Pt given water and urinal emptied. 500 cc of output

## 2020-04-20 NOTE — ED Notes (Signed)
Pt given lunch tray.

## 2020-04-20 NOTE — ED Notes (Signed)
Pt given meal tray with orange juice.

## 2020-04-20 NOTE — ED Notes (Addendum)
Pt yelling heard from room, this RN to bedside and pt states he needs to have a bowel movement. Pt assisted to ambulate to toilet with walker. Pt gait slow and unsteady but pt insists that he does not need help. This RN kept hand on patient's back during ambulation for safety and pt states, "Dont push me, I can do it myself". Pt had bowel movement and underwear changed per request. Pt ambulated back to bed with one person assist and walker. Bed alarm set.
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
## 2020-04-20 NOTE — Social Work (Signed)
TOC CM/SW consult received for SNF placement/home placement.  Patient has been denied psychiatric inpatient due to reasons unclear.  SW will assess.

## 2020-04-20 NOTE — ED Notes (Signed)
Pt given meal tray.

## 2020-04-20 NOTE — ED Notes (Signed)
Pt given orange juice and water.  

## 2020-04-20 NOTE — ED Notes (Signed)
Pt adjusted in bed by self, pt given graham crackers and ginger ale per request. 250 mL urine emptied from urinal.  No other needs from pt at this time.

## 2020-04-20 NOTE — ED Notes (Signed)
1 black j[employer] hat 1 pack of [place] red cigarettes  2 short pencils 1 black comb 1 pair of black sneakers 1 pair grey gym shorts  Pt belongings found by the RN in belongings bag on the back of patient's bed. Belongings placed in patient labeled bag.

## 2020-04-20 NOTE — ED Notes (Signed)
Pt given water per request. Pt resting comfortably, no c/o pain.

## 2020-04-20 NOTE — ED Provider Notes (Signed)
Emergency Medicine Observation Re-evaluation Note  Luis Cooper is a 79 y.o. male, seen on rounds today.  \  Physical Exam  BP (!) 146/78 (BP Location: Right Arm)   Pulse 88   Temp 97.8 F (36.6 C) (Oral)   Resp 17   Ht 5\' 2"  (1.575 m)   Wt 64 kg   SpO2 94%   BMI 25.81 kg/m  Physical Exam Patient is sleeping again this morning.  He did wake up yesterday after my initial note and was in good spirits.  Today  Constitutional: Well-developed well-nourished male sleeping comfortably no distress Respiratory: Breathing easily no retractions no snoring. ED Course / MDM  EKG:  Clinical Course as of Apr 20 734  Tue Apr 15, 2020  0615 Patient becoming increasingly agitated and combative towards staff.  Informed him that that behavior would not be tolerated however in further discussion with him I am concerned that he is acutely psychotic.  Possibly secondary to substance abuse but patient refusing blood work right now.  CT head without any evidence of acute intracranial abnormality.  Does have a history of substance abuse as well as bipolar disorder.  He is otherwise hemodynamically stable does consent to UDS.  But given his aggressive behavior towards bystanders his erratic behavior here in the ER with history of bipolar disorder I do believe will require IVC and psychiatric consultation and further observation here in the ER.   [PR]  0616 CT   [PR]    Clinical Course User Index [PR] 5400, MD   I have reviewed the labs performed to date as well as medications administered while in observation.  I am unaware of any trouble with the patient yesterday during the day or so far today or last night. Plan  Patient is under involuntary commitment and awaiting placement.   Willy Eddy, MD 04/20/20 [phone]

## 2020-04-21 MED ORDER — ONDANSETRON 4 MG PO TBDP
ORAL_TABLET | ORAL | Status: AC
  Filled 2020-04-21: qty 1

## 2020-04-21 NOTE — ED Notes (Signed)
Dr Rao at bedside 

## 2020-04-21 NOTE — ED Notes (Signed)
Pt given decaf coffee that was cooled with time and milk

## 2020-04-21 NOTE — ED Notes (Signed)
Patient assisted to stand and take a few steps around room with walker. Patient able to walk several steps with standby assistance then requesting to return to bed. Patient repositioned in bed. Bed alarm in place. Given coffer per request.

## 2020-04-21 NOTE — ED Notes (Signed)
Pt talkitive and pleasant, not oriented to date - knows he can't walk but reports plan to get better and leave, [place] pt given new underwear (can put it on himself) water, apple sauce and PB given, pillow adjusted under knees, pt reports not sleepy and TV on loud, pt indicates "hard of hearing"  Bed alarm on, pt pulled up in bed
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
## 2020-04-21 NOTE — ED Notes (Signed)
Pt attempted to ambulate to bathroom with this Rn's assistance and walker. Patient tolerated to bathroom, refuses assistance but this Rn stayed at side. Pt was able to get to toilet and sit down, but on the way back becomes to weak to get to bed. This RN gets assistance from tech and we place patient at end of bed and pull him up. Patient comfortable in bed at this time, this RN discusses with patient that we won't be taking him across the room to the toilet again due to patient's weakness. Pt understands at this time.
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
## 2020-04-21 NOTE — ED Notes (Signed)
Patient given breakfast tray. Sitting up in bed eating at this time. No further needs expressed. 

## 2020-04-21 NOTE — Progress Notes (Signed)
Patient ID: Luis Cooper, male   DOB: November 10, 1940, 79 y.o.   MRN: [id number]   Very Brief progress note  Patient is psych cleared At baseline  IVC rescinded  Today   Awaits transfer to SNF or appropriate facility   He has a home but not clear if he can go there   He meets capacity for consent  However   MS today is about the same Not clouded or fluctuant Mood okay for now  Anxiety not of high issues  Contracts for safety in general  Concentration and attention normal  No other clouded or fluctuant consciousness  Continues with current meds  Psych cleared as there is no more criteria  Needs to stay on psych meds for bipolar mixed issues   Rama pemmaraj u Smith Robert MD

## 2020-04-21 NOTE — ED Provider Notes (Signed)
Emergency Medicine Observation Re-evaluation Note  Luis Cooper is a 79 y.o. male, seen on rounds today.  Pt initially presented to the ED for complaints of Fall Currently, the patient is calm, cooperative, in NAD.  Physical Exam  BP (!) 142/79 (BP Location: Right Arm)   Pulse 95   Temp 98.3 F (36.8 C) (Oral)   Resp 18   Ht 5\' 2"  (1.575 m)   Wt 64 kg   SpO2 95%   BMI 25.81 kg/m  Physical Exam   Gen: Calm, in NAD, resting in psych scrubs HEENT: NCAT CV: Well perfused Resp: Normal work of breathing, no apparent resp distress Neuro: Non-focal, no apparent deficits, at mental baseline Psych: Calm, resting  ED Course / MDM  EKG:  Clinical Course as of Apr 22 847  Tue Apr 15, 2020  0615 Patient becoming increasingly agitated and combative towards staff.  Informed him that that behavior would not be tolerated however in further discussion with him I am concerned that he is acutely psychotic.  Possibly secondary to substance abuse but patient refusing blood work right now.  CT head without any evidence of acute intracranial abnormality.  Does have a history of substance abuse as well as bipolar disorder.  He is otherwise hemodynamically stable does consent to UDS.  But given his aggressive behavior towards bystanders his erratic behavior here in the ER with history of bipolar disorder I do believe will require IVC and psychiatric consultation and further observation here in the ER.   [PR]  0616 CT   [PR]    Clinical Course User Index [PR] 8182, MD   I have reviewed the labs performed to date as well as medications administered while in observation.  Recent changes in the last 24 hours include None. Plan  Current plan is for IVC, seeking placement.. Patient is under full IVC at this time.   Willy Eddy, MD 04/21/20 [phone]

## 2020-04-22 ENCOUNTER — Other Ambulatory Visit: Payer: Self-pay

## 2020-04-22 NOTE — ED Provider Notes (Signed)
-----------------------------------------   8:54 PM on 04/22/2020 -----------------------------------------  Patient noted to have fallen forward off of the commode, striking his head.  He did not lose consciousness and denies any pain in his head or neck.  He denies hitting any other part of his body.  He is not anticoagulated and does not have any focal neurologic deficits on reexamination.  I recommended that he receive CT scan of his head to ensure no intracranial injury, however patient adamantly declined.  He is currently boarding in the ED for psychiatric admission due to bipolar disorder, but has no history of dementia and seems to have capacity to make this decision at this time.  We will continue to observe here in the ED.    Chesley Noon, MD 04/22/20 2055

## 2020-04-22 NOTE — ED Notes (Signed)
Pt cleaned up and new linens placed on bed and chux. Pt given clean brief. Pt repositioned and resting at this time. Pt given blankets.

## 2020-04-22 NOTE — ED Notes (Signed)
Pt assisted to toilet 

## 2020-04-22 NOTE — ED Notes (Signed)
Pt provided w/ lunch tray. Pt repositioned in bed and brought water per request.

## 2020-04-22 NOTE — ED Notes (Signed)
Pt assisted to toilet. Pt wants to be independent but requires assistance getting back into bed.

## 2020-04-22 NOTE — ED Notes (Signed)
Papers rescinded, pend geri-psych placement

## 2020-04-22 NOTE — ED Notes (Signed)
Pt given meal tray.

## 2020-04-22 NOTE — ED Notes (Signed)
Pt assisted to toilet, then placed back in bed.

## 2020-04-22 NOTE — ED Notes (Signed)
Pt provided w/ dinner tray

## 2020-04-22 NOTE — ED Notes (Signed)
Pt was assisted to toilet by RN Selena Batten. Pt stated he needed 5 more minutes on the toilet. Bed was moved over to the toilet and pt's walker was in front of him. This RN asked pt to pull alarm when finished using the toilet and pt acknowledged he would. This RN stepped to send labwork and pt attempted to get in the bed and fell on the floor. Pt stated he "bumped his head" and "hit his right elbow". Pt placed back in the bed and EDP notified.

## 2020-04-22 NOTE — ED Notes (Signed)
Pt bed saturated with urine. This tech informed pt that I was going to change his linen. Pt began to yell at this tech. I explained to pt that laying in urine was going to cause his skin to break down. Pt continued to yell "not my skin." Pt continues to lay on wet sheet. Sam, RN made aware.

## 2020-04-23 MED ORDER — AMLODIPINE BESYLATE 10 MG PO TABS: 10.0000 mg | ORAL_TABLET | Freq: Every day | ORAL | 1 refills | Status: DC

## 2020-04-23 MED ORDER — METFORMIN HCL 500 MG PO TABS: 1000.0000 mg | ORAL_TABLET | Freq: Two times a day (BID) | ORAL | 1 refills | Status: DC

## 2020-04-23 MED ORDER — DIVALPROEX SODIUM 500 MG PO DR TAB: 500.0000 mg | DELAYED_RELEASE_TABLET | Freq: Two times a day (BID) | ORAL | 11 refills | Status: DC

## 2020-04-23 MED ORDER — POLYETHYLENE GLYCOL 3350 17 G PO PACK: 17.0000 g | PACK | Freq: Every day | ORAL | 1 refills | Status: AC

## 2020-04-23 MED ORDER — IBUPROFEN 600 MG PO TABS: 600.0000 mg | ORAL_TABLET | Freq: Two times a day (BID) | ORAL | 1 refills | Status: AC

## 2020-04-23 MED ORDER — OLANZAPINE 5 MG PO TABS: 5.0000 mg | ORAL_TABLET | Freq: Every evening | ORAL | 1 refills | Status: DC

## 2020-04-23 NOTE — ED Notes (Signed)
This RN to bedside, introduced self to patient. Pt used urinal without difficulty. Pt denies any needs. Pt visualized in NAD at this time.
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
## 2020-04-23 NOTE — ED Notes (Signed)
Pt assisted from the bathroom back to bed. Pt standby assist, pt ambulatory with walker at this time.

## 2020-04-23 NOTE — ED Notes (Signed)
This RN to bedside due to patient calling out, "hey hey hey!". Pt requesting to know what he has to do to get a pass to run an errand, pt states there is a truck for sale for $4,000 and "it's a steal". This RN explained a day pass to run and errand was not permitted. Pt states "if I can walk with a walker, I'm safe to go home". Pt states he has a room with a woman on [address]. Pt states he got first choice and he feels safe going home. This RN explained to patient would review chart and see what plan was and update as appropriate. PT states understanding. Pt continues to be in NAD at this time, resting in bed watching TV.

## 2020-04-23 NOTE — ED Notes (Signed)
Pt repositioned in bed by this RN and Vet, EDT. Pt continues to be in NAD at this time.

## 2020-04-23 NOTE — ED Notes (Signed)
Pt visualized in NAD. Pt aware of plan of care via Darl Pikes, Care management. Pt awaiting D/C after getting his medications which will be provided by Care Management.

## 2020-04-23 NOTE — TOC Transition Note (Addendum)
Transition of Care [facility]) - CM/SW Discharge Note   Patient Details  Name: Luis Cooper MRN: [id number] Date of Birth: June 23, 1941  Transition of Care [facility]) CM/SW Contact:  [place] Cellar, RN Phone Number: 04/23/2020, 4:41 PM   Clinical Narrative:    Confirmed transportation arranged via [employer]. Patient will be picked up at 4:47pm in a silver [employer] sorento. Updated EDP, ED RN and Diplomatic Services operational officer.   Notified that patients brother arrived while transportation was being arranged and is taking patient home.   RN CM notified [employer] of cancellation of service.   Confirmed change of address with Zack who will be shipping walker. Patient will reside at [address] [place]   Spoke with Mark-Brother @ [phone] concerned that patient is living alone and he is worried about placement. Mark requested home health services. Explained lack of insurance and possibility of HHC not being available but will certainly reach out to [facility]. Provided [employer] with number and website for [employer] and Medicaid information. [name] confirmed patient has over 1 million dollars in land/property and will not qualify for Medicaid. RN CM discussed importance of patient following up on Medicare as patient meets criteria due to age. Confirmed referral was made for Meals on Wheels and [facility] walker was order to be shipped to patient. RN CM discussed options of private pay ALF by family and brother states he is not able to do that and patients ex wife and children do not help patient at all. Brother confirmed he is taking patient home and will assist as able but he lives in [place] and was just here visiting.   Outreached to [place] with [facility] for potential HHC services for PT and aide.      Barriers to Discharge: No Barriers Identified   Patient Goals and CMS Choice Patient states their goals for this hospitalization and ongoing recovery are:: Return home       Discharge Placement                       Discharge Plan and Services     Post Acute Care Choice: Durable Medical Equipment (walker-Charity)                               Social Determinants of Health (SDOH) Interventions     Readmission Risk Interventions No flowsheet data found.

## 2020-04-23 NOTE — ED Notes (Signed)
Pt assisted into clean shirt by this RN. Pt offered assistance to get cleaned up by this RN. Pt refused stating, "I'm as clean as I ever am". Pt's soiled white T-shirt placed into belongings bag and left at bedside with patient. Darl Pikes, CSM at bedside to speak with patient regarding D/C dispositions at this time.

## 2020-04-23 NOTE — ED Notes (Signed)
Family arrived(brother [name])  and took pt home.  Family spoke with susan case manger via phone about pt's condition.

## 2020-04-23 NOTE — ED Notes (Signed)
Pt waiting on ride to be discharged.

## 2020-04-23 NOTE — ED Notes (Signed)
Pt repositioned in bed by this RN. Pt provided with meal tray by this RN. Pt requesting to know plan of care. Explained this RN would have to speak with social worker and find out plan of care from Child psychotherapist.

## 2020-04-23 NOTE — ED Notes (Signed)
Pt given water and ice cream per request. Pt sitting up in bed watching TV. Pt is calm and in NAD at this time.

## 2020-04-23 NOTE — ED Notes (Signed)
Pt repositioned in bed at this time by this RN. Pt visualized in NAD at this time. Call bell remains within reach at this time.

## 2020-04-23 NOTE — ED Notes (Signed)
Pt provided cereal after asking if he could have food before Breakfast arrived to ED.

## 2020-04-23 NOTE — ED Notes (Signed)
Darl Pikes, CSM made aware of patient's request for update regarding plan of care and patient stating he has a room to go to. Per Darl Pikes, CSM will come speak to patient regarding possible discharge today.

## 2020-04-23 NOTE — ED Notes (Addendum)
Pt fell getting up to get bag off of counter.  Walker with pt.  Pt found lying on the floor. Unwitnessed fall.  Pt had a call bell with siderails up.  Bed alarm in place.   No obvious injury  Pt denies injury.  Pt alert  Speech clear.  Pt waiting on discharge.  Dr Derrill Kay aware.   Dr Derrill Kay in with pt now.

## 2020-04-23 NOTE — ED Notes (Signed)
Pt repositioned in bed by this RN. Pt provided meal tray at this time.

## 2020-04-23 NOTE — ED Provider Notes (Signed)
Went to evaluate the patient after a fall. He denies any significant injury or pain. No tenderness on exam, no deformity. No laceration or new skin tear. Denies any LOC. Denies any headache or head injury. The patient states that he still would like to go home.   Phineas Semen, MD 04/23/20 [phone]

## 2020-04-24 ENCOUNTER — Other Ambulatory Visit: Payer: Self-pay

## 2020-04-24 ENCOUNTER — Emergency Department: Payer: Medicare Other

## 2020-04-24 ENCOUNTER — Emergency Department
Admission: EM | Admit: 2020-04-24 | Discharge: 2020-05-22 | Disposition: A | Discharge status: Skilled Nursing Facility | Payer: Medicare Other | Attending: Emergency Medicine | Admitting: Emergency Medicine

## 2020-04-24 ENCOUNTER — Encounter: Payer: Self-pay | Admitting: Emergency Medicine

## 2020-04-24 DIAGNOSIS — Y939 Activity, unspecified: Secondary | ICD-10-CM | POA: Diagnosis not present

## 2020-04-24 DIAGNOSIS — Z7984 Long term (current) use of oral hypoglycemic drugs: Secondary | ICD-10-CM | POA: Diagnosis not present

## 2020-04-24 DIAGNOSIS — W1839XA Other fall on same level, initial encounter: Secondary | ICD-10-CM | POA: Diagnosis not present

## 2020-04-24 DIAGNOSIS — Y999 Unspecified external cause status: Secondary | ICD-10-CM | POA: Diagnosis not present

## 2020-04-24 DIAGNOSIS — M25551 Pain in right hip: Secondary | ICD-10-CM | POA: Insufficient documentation

## 2020-04-24 DIAGNOSIS — R531 Weakness: Secondary | ICD-10-CM | POA: Diagnosis not present

## 2020-04-24 DIAGNOSIS — S76111A Strain of right quadriceps muscle, fascia and tendon, initial encounter: Secondary | ICD-10-CM | POA: Insufficient documentation

## 2020-04-24 DIAGNOSIS — R000 Tachycardia, unspecified: Secondary | ICD-10-CM | POA: Diagnosis not present

## 2020-04-24 DIAGNOSIS — S8991XA Unspecified injury of right lower leg, initial encounter: Secondary | ICD-10-CM | POA: Diagnosis present

## 2020-04-24 DIAGNOSIS — E119 Type 2 diabetes mellitus without complications: Secondary | ICD-10-CM | POA: Insufficient documentation

## 2020-04-24 DIAGNOSIS — F17210 Nicotine dependence, cigarettes, uncomplicated: Secondary | ICD-10-CM | POA: Diagnosis not present

## 2020-04-24 DIAGNOSIS — Y929 Unspecified place or not applicable: Secondary | ICD-10-CM | POA: Diagnosis not present

## 2020-04-24 DIAGNOSIS — Z20822 Contact with and (suspected) exposure to covid-19: Secondary | ICD-10-CM | POA: Diagnosis not present

## 2020-04-24 LAB — CBC
HCT: 41.2 % (ref 39.0–52.0)
Hemoglobin: 14.8 g/dL (ref 13.0–17.0)
MCH: 31.3 pg (ref 26.0–34.0)
MCHC: 35.9 g/dL (ref 30.0–36.0)
MCV: 87.1 fL (ref 80.0–100.0)
Platelets: 298 10*3/uL (ref 150–400)
RBC: 4.73 MIL/uL (ref 4.22–5.81)
RDW: 12.9 % (ref 11.5–15.5)
WBC: 9.2 10*3/uL (ref 4.0–10.5)
nRBC: 0 % (ref 0.0–0.2)

## 2020-04-24 LAB — URINALYSIS, COMPLETE (UACMP) WITH MICROSCOPIC
Bilirubin Urine: NEGATIVE
Glucose, UA: 50 mg/dL — AB
Hgb urine dipstick: NEGATIVE
Ketones, ur: NEGATIVE mg/dL
Leukocytes,Ua: NEGATIVE
Nitrite: NEGATIVE
Protein, ur: NEGATIVE mg/dL
Specific Gravity, Urine: 1.014 (ref 1.005–1.030)
Squamous Epithelial / HPF: NONE SEEN (ref 0–5)
pH: 5 (ref 5.0–8.0)

## 2020-04-24 LAB — BASIC METABOLIC PANEL
Anion gap: 13 (ref 5–15)
BUN: 34 mg/dL — ABNORMAL HIGH (ref 8–23)
CO2: 24 mmol/L (ref 22–32)
Calcium: 9.5 mg/dL (ref 8.9–10.3)
Chloride: 100 mmol/L (ref 98–111)
Creatinine, Ser: 1.64 mg/dL — ABNORMAL HIGH (ref 0.61–1.24)
GFR calc Af Amer: 46 mL/min — ABNORMAL LOW (ref >60)
GFR calc non Af Amer: 40 mL/min — ABNORMAL LOW (ref >60)
Glucose, Bld: 249 mg/dL — ABNORMAL HIGH (ref 70–99)
Potassium: 4.1 mmol/L (ref 3.5–5.1)
Sodium: 137 mmol/L (ref 135–145)

## 2020-04-24 LAB — CK: Total CK: 152 U/L (ref 49–397)

## 2020-04-24 LAB — TROPONIN I (HIGH SENSITIVITY): Troponin I (High Sensitivity): 12 ng/L (ref <18)

## 2020-04-24 LAB — GLUCOSE, CAPILLARY: Glucose-Capillary: 260 mg/dL — ABNORMAL HIGH (ref 70–99)

## 2020-04-24 LAB — TSH: TSH: 2.79 u[IU]/mL (ref 0.350–4.500)

## 2020-04-24 NOTE — ED Triage Notes (Signed)
Pt in via POV, reports ongoing weakness, being unable to walk with or without his walker.  Pt with recent admission and discharge due to a fall.  A/Ox4, vitals WDL, NAD noted at this time.

## 2020-04-24 NOTE — Congregational Nurse Program (Signed)
Client brought to the clinic by his brother who lives in [place]. Client was in the ED from 04-15-20 to 04-23-20 d/t falling. He was discharged back to his boarding house. According to his brother he can not walk 28ft without falling and he has had 2 falls since being discharged. He is incontinent, he cannot cook for himself and he doesn't know what to do with him. I called Darl Pikes the case manager who worked with him at the hospital. She discharged him with a walker and meals on wheels and explained to the brother that he needed to apply for Medicare. He was instructed to go to social services. His brother has no one to care for him. I gave him directions to social services to apply for assistance and also offered a possible caregiver for him.
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
## 2020-04-24 NOTE — ED Notes (Signed)
ED Provider at bedside. 

## 2020-04-24 NOTE — ED Notes (Signed)
Pt stated to this RN that he had a room at a boarding house through the 2nd of August. Pt stated "Mimi Beckie Salts was in charge and house was on [address] in [place].

## 2020-04-24 NOTE — ED Notes (Signed)
Patient transported to X-ray 

## 2020-04-25 MED ORDER — DIVALPROEX SODIUM 500 MG PO DR TAB: 500.0000 mg | DELAYED_RELEASE_TABLET | Freq: Two times a day (BID) | ORAL | Status: DC

## 2020-04-25 MED ORDER — METFORMIN HCL 500 MG PO TABS
1000.0000 mg | ORAL_TABLET | Freq: Two times a day (BID) | ORAL | Status: DC
  Administered 2020-04-25 – 2020-05-07 (×25): 1000 mg via ORAL
  Filled 2020-04-25 (×28): qty 2

## 2020-04-25 MED ORDER — AMLODIPINE BESYLATE 5 MG PO TABS
10.0000 mg | ORAL_TABLET | Freq: Every day | ORAL | Status: DC
  Administered 2020-04-25 – 2020-05-22 (×28): 10 mg via ORAL
  Filled 2020-04-25 (×28): qty 2

## 2020-04-25 MED ORDER — DIVALPROEX SODIUM 500 MG PO DR TAB
500.0000 mg | DELAYED_RELEASE_TABLET | Freq: Two times a day (BID) | ORAL | Status: DC
  Administered 2020-04-25 – 2020-05-22 (×55): 500 mg via ORAL
  Filled 2020-04-25 (×55): qty 1

## 2020-04-25 MED ORDER — OLANZAPINE 5 MG PO TABS
5.0000 mg | ORAL_TABLET | Freq: Every day | ORAL | Status: DC
  Administered 2020-04-25 – 2020-05-05 (×8): 5 mg via ORAL
  Administered 2020-05-05: 2.5 mg via ORAL
  Administered 2020-05-06 – 2020-05-21 (×14): 5 mg via ORAL
  Filled 2020-04-25 (×26): qty 1

## 2020-04-25 NOTE — ED Notes (Signed)
Pts lien changed by [employer], EDT

## 2020-04-25 NOTE — ED Notes (Signed)
Pt voided cleaned urinal no other needs

## 2020-04-25 NOTE — Progress Notes (Signed)
Inpatient Diabetes Program Recommendations  AACE/ADA: New Consensus Statement on Inpatient Glycemic Control  Target Ranges:  Prepandial:   less than 140 mg/dL      Peak postprandial:   less than 180 mg/dL (1-2 hours)      Critically ill patients:  140 - 180 mg/dL   Results for DARRIUS, MONTANO (MRN [id number]) as of 04/25/2020 13:44  Ref. Range 04/24/2020 16:14  Glucose-Capillary Latest Ref Range: 70 - 99 mg/dL 564 (H)  Results for SABASTIAN, RAIMONDI (MRN [id number]) as of 04/25/2020 13:44  Ref. Range 04/24/2020 16:12  Glucose Latest Ref Range: 70 - 99 mg/dL 166 (H)   Review of Glycemic Control  Diabetes history: DM2 Outpatient Diabetes medications: Metformin 1000 mg BID Current orders for Inpatient glycemic control: Metformin 1000 mg BID  Inpatient Diabetes Program Recommendations:    Insulin-While holding in ED, please consider using Glycemic Control order set to order CBGs AC&HS with Novolog 0-9 units TID with meals and Novolog 0-5 units QHS.  Thanks, Orlando Penner, RN, MSN, CDE Diabetes Coordinator Inpatient Diabetes Program [phone] (Team Pager from 8am to 5pm)

## 2020-04-25 NOTE — ED Notes (Signed)
Provided pt with wipes pt requested them no other needs

## 2020-04-25 NOTE — ED Notes (Signed)
Pt clothes changed by Jae Dire, RN and [place], EDT

## 2020-04-25 NOTE — ED Notes (Signed)
Pt given meal tray.

## 2020-04-25 NOTE — Evaluation (Signed)
Physical Therapy Evaluation Patient Details Name: Luis Cooper MRN: [id number] DOB: 04-Feb-1941 Today's Date: 04/25/2020   History of Present Illness  Readmitted to ED secondary to difficulty with ambulation. History includes bipolar, DM, and gout. Cleared from ortho for WBAT with KI donned. Pt reports initial fall with injury on 04/14/20 and has been having difficulty with ambulation.  Clinical Impression  Pt is a pleasant 79 year old male who was admitted for difficulty with ambulation. Pt performs transfers with min assist and ambulation with cga and RW. Educated on KI and WB precautions. Reports more pain with WBing. Pt demonstrates deficits with strength/mobility/pain. Pt Is very high falls risk with poor safety and judgement. Pt appears agreeable to rehab services. Would benefit from skilled PT to address above deficits and promote optimal return to PLOF; recommend transition to STR upon discharge from acute hospitalization.     Follow Up Recommendations SNF    Equipment Recommendations  None recommended by PT    Recommendations for Other Services       Precautions / Restrictions Precautions Precautions: Fall Required Braces or Orthoses: Knee Immobilizer - Right Knee Immobilizer - Right: On at all times Restrictions Weight Bearing Restrictions: Yes RLE Weight Bearing: Weight bearing as tolerated      Mobility  Bed Mobility               General bed mobility comments: deferred as pt received ambulating in hallway from bathroom and then sits on bed.  Transfers Overall transfer level: Needs assistance Equipment used: Rolling walker (2 wheeled) Transfers: Sit to/from Stand Sit to Stand: Min assist         General transfer comment: bed elevated and cues given for pushing from seated surface rather than pulling on RW. Once standing, weight shifts to L side. KI donned for all mobility  Ambulation/Gait Ambulation/Gait assistance: Min guard Gait Distance (Feet):  40 Feet Assistive device: Rolling walker (2 wheeled) Gait Pattern/deviations: Step-to pattern     General Gait Details: ambulated in hallway coming back from bathroom with antalgic gait pattern and unsteadiness. 1 LOB noted with RN needed to provide mod assist for correction. Slow speed  Stairs            Wheelchair Mobility    Modified Rankin (Stroke Patients Only)       Balance Overall balance assessment: History of Falls;Needs assistance Sitting-balance support: No upper extremity supported;Feet unsupported Sitting balance-Leahy Scale: Fair     Standing balance support: Bilateral upper extremity supported Standing balance-Leahy Scale: Fair                               Pertinent Vitals/Pain Pain Assessment: 0-10 Pain Score: 7  Pain Location: R knee Pain Descriptors / Indicators: Discomfort;Dull;Grimacing;Guarding Pain Intervention(s): Limited activity within patient's tolerance    Home Living Family/patient expects to be discharged to:: Private residence Living Arrangements: Alone   Type of Home: Apartment Home Access:  (rents a room in a boarding house)     Home Layout: One level (portable ramp to enter home) Home Equipment: Dan Humphreys - 2 wheels;Crutches      Prior Function Level of Independence: Independent         Comments: Indep with ADLs,household and community mobilization; denies additional fall history. recently has been having difficulty with ambulation     Hand Dominance        Extremity/Trunk Assessment   Upper Extremity Assessment Upper Extremity Assessment: Overall [facility]  for tasks assessed    Lower Extremity Assessment Lower Extremity Assessment: Generalized weakness (L LE grossly 4/5; R LE grossly 3+/5)       Communication   Communication: No difficulties  Cognition Arousal/Alertness: Awake/alert Behavior During Therapy: WFL for tasks assessed/performed Overall Cognitive Status: No family/caregiver present to  determine baseline cognitive functioning                                 General Comments: particular about assistance therapist is trying to provide. Appears to have decreased safety awareness.      General Comments      Exercises Other Exercises Other Exercises: attempted to stand and march, however unable to fully weight shift in standing position with RW. Practiced transfers including educating on safety   Assessment/Plan    PT Assessment Patient needs continued PT services  PT Problem List Decreased range of motion;Decreased activity tolerance;Decreased balance;Decreased mobility;Decreased coordination;Decreased cognition;Decreased knowledge of use of DME;Decreased safety awareness;Decreased knowledge of precautions;Pain       PT Treatment Interventions DME instruction;Gait training;Functional mobility training;Therapeutic activities;Therapeutic exercise;Balance training;Neuromuscular re-education;Cognitive remediation;Patient/family education    PT Goals (Current goals can be found in the Care Plan section)  Acute Rehab PT Goals Patient Stated Goal: to get stronger PT Goal Formulation: With patient Time For Goal Achievement: 05/09/20 Potential to Achieve Goals: Fair    Frequency Min 2X/week   Barriers to discharge        Co-evaluation               AM-PAC PT "6 Clicks" Mobility  Outcome Measure Help needed turning from your back to your side while in a flat bed without using bedrails?: A Little Help needed moving from lying on your back to sitting on the side of a flat bed without using bedrails?: A Little Help needed moving to and from a bed to a chair (including a wheelchair)?: A Little Help needed standing up from a chair using your arms (e.g., wheelchair or bedside chair)?: A Little Help needed to walk in hospital room?: A Lot Help needed climbing 3-5 steps with a railing? : Total 6 Click Score: 15    End of Session Equipment Utilized  During Treatment: Gait belt Activity Tolerance: Patient tolerated treatment well Patient left: in bed (with security guard present) Nurse Communication: Mobility status PT Visit Diagnosis: Muscle weakness (generalized) (M62.81);Difficulty in walking, not elsewhere classified (R26.2)    Time: 2595-6387 PT Time Calculation (min) (ACUTE ONLY): 23 min   Charges:   PT Evaluation $PT Eval Moderate Complexity: 1 Mod PT Treatments $Therapeutic Activity: 8-22 mins        Elizabeth Palau, PT, DPT [phone]   Zaidan Keeble 04/25/2020, 4:21 PM

## 2020-04-25 NOTE — ED Notes (Signed)
Pt is awake, lying on bed no needs at present

## 2020-04-25 NOTE — ED Notes (Signed)
Pt ambulated to restroom using walker. Pt assisted by this RN and Lenise Arena.

## 2020-04-25 NOTE — ED Notes (Signed)
PT at bedside.

## 2020-04-25 NOTE — ED Notes (Signed)
Pt ambulated to restroom with walker with minimal assistance

## 2020-04-25 NOTE — TOC Progression Note (Signed)
Transition of Care [facility]) - Progression Note    Patient Details  Name: Luis Cooper MRN: [id number] Date of Birth: 1940/12/07  Transition of Care [facility]) CM/SW Contact  Marina Goodell Phone Number: [phone] 04/25/2020, 9:43 AM  Clinical Narrative:     Placement pending on patient's Medicare to be reinstated by DSS office.  Patient's brother [name] has already requested reinstatement but must wait for approval. Patient unsafe to return home, and landlord will not allow him to return until he ambulate safely and independently. TOC will not be able to place patient in SNF until Medicare has been reinstated.  Patient has assets valued over the Medicaid threshold, and will not qualify for Medicaid, patient refusing to sell assets, therefore only has SS check as disposable income, and will not able to afford a private pay facility.        Expected Discharge Plan and Services                                                 Social Determinants of Health (SDOH) Interventions    Readmission Risk Interventions No flowsheet data found.

## 2020-04-26 NOTE — ED Notes (Signed)
Pt states being here and feeling good. Pt has hand urinal at bedside.  Pt states no pain at this time. Pt noted to have meal tray with him.

## 2020-04-26 NOTE — ED Notes (Signed)
Pt assisted into sitting position after having fear with RN about movement and about how movement is going to occur. Pt yelling into RN's ear while movement occurring. Pt informed to please not yell and to attempt to "trust" caregiver with repositioning. Pt provided with additional po fluids. Non skid socks in place.

## 2020-04-26 NOTE — ED Notes (Signed)
Po fluids provided per pt request. Pt with urinal at side. Privacy screens up around pt in hallway bed. resps unlabored.

## 2020-04-26 NOTE — ED Notes (Signed)
Resting, NAD.  Unlabored even respirations.

## 2020-04-26 NOTE — ED Notes (Signed)
Report from sylvia, rn.  

## 2020-04-26 NOTE — ED Notes (Signed)
Spoke with Child psychotherapist after receiving call from brother mark requesting pt sign a paper so that he can get his medicaid/medicare reinstated and have it emailed back to him.  Luis Cooper will look into this and get in contact with the brother.

## 2020-04-26 NOTE — ED Notes (Signed)
Pt talking to self in hallway about "having to rest". Pt with unlabored resps and appears in no acute distress.

## 2020-04-26 NOTE — ED Notes (Signed)
Pt resting, resps unlabored.  

## 2020-04-26 NOTE — ED Notes (Signed)
Pt sleeping in bed at this time. Chest rise/fall observed, RR reg and unlabored.

## 2020-04-26 NOTE — ED Notes (Signed)
Pt given breakfast tray

## 2020-04-26 NOTE — ED Notes (Signed)
Right leg elevated for comfort.  Blanket given. Laying in bed, NAD. No needs at this time.

## 2020-04-26 NOTE — ED Notes (Signed)
Pt yelling intermittently in hallway about various things. Pt asking why privacy screens are in place. Explanation provided to pt regarding need to maintain pt's privacy due to being in hallway bed. Pt states "well I don't want you to respect my privacy".

## 2020-04-26 NOTE — ED Notes (Signed)
Pt given breakfast tray around 9:30 AM . Pts vitals obtained.  ° °Pt given bath wipes in order to wipe down a bit since the water is down.  ° °lw edt  ° °

## 2020-04-26 NOTE — ED Notes (Signed)
Pt assisted back to lying position in bed. of clear yellow urine emptied from urinal.

## 2020-04-26 NOTE — ED Notes (Signed)
Pt provided w/ box meal at bedside.

## 2020-04-26 NOTE — ED Notes (Addendum)
Pt assisted via wheelchair to bathroom. Eating lunch tray.

## 2020-04-26 NOTE — ED Notes (Signed)
ED tech Scott provided pt w/ water. Pt comfortable in bed watching TV.

## 2020-04-26 NOTE — ED Notes (Signed)
Pt call bell ringing and pt requesting to be repositioned in bed, pt pulled up in bed by this writer, pt remains with knees flexed for comfort. Pt expresses no other needs at this time, pt reminded that if he should need anything to let staff know and pt verbalizes understanding

## 2020-04-26 NOTE — ED Notes (Signed)
Pt given applesauce and crackers.  NAD.  Waiting on bed placement.  No further needs at this time. Explained process to pt

## 2020-04-26 NOTE — ED Notes (Signed)
This RN placed bed alarm on for pt safety.

## 2020-04-26 NOTE — ED Notes (Signed)
Pt assisted to bathroom to attempt bowel movement.

## 2020-04-26 NOTE — ED Notes (Signed)
Report to valerie, rn.  

## 2020-04-27 NOTE — ED Notes (Signed)
Patient given dinner tray.

## 2020-04-27 NOTE — ED Notes (Signed)
Pt given chocolate ice cream per his request, pt ate 100% of dinner.

## 2020-04-27 NOTE — TOC Progression Note (Signed)
Transition of Care [facility]) - Progression Note    Patient Details  Name: Luis Cooper MRN: [id number] Date of Birth: [birth year]  Transition of Care [facility]) CM/SW Contact  Larwance Rote, LCSW Phone Number: 04/27/2020, 11:16 AM  Clinical Narrative:   Placement pending on patient's Medicare to be reinstated by DSS office.         Expected Discharge Plan and Services          Social Determinants of Health (SDOH) Interventions    Readmission Risk Interventions No flowsheet data found.

## 2020-04-27 NOTE — ED Notes (Signed)
Pt given ice cream per his request. Pt ate 100%of lunch.

## 2020-04-27 NOTE — ED Notes (Signed)
Pt reoriented to date and time.

## 2020-04-27 NOTE — ED Notes (Signed)
Pt in recliner at bedside and educated by NT not to get up without assistance.

## 2020-04-27 NOTE — ED Notes (Signed)
Pt requesting to get back in bed, assisted by this Clinical research associate and RN Jillyn Hidden. Pt resting comfortably in bed at this time with no further needs expressed.

## 2020-04-27 NOTE — ED Notes (Signed)
Pt given breakfast tray

## 2020-04-27 NOTE — ED Notes (Signed)
Pt requesting and providing with blue recliner chair. Pt assisted in chair by this writer and RN Jillyn Hidden, pt resting comfortably in recliner with no further needs at this time.

## 2020-04-28 NOTE — ED Notes (Signed)
Pt given bottle water and just finished lunch tray. Pt urinal emptied and placed back at bedside.

## 2020-04-28 NOTE — ED Notes (Signed)
Resumed care from dejea rn.  Pt alert.  Pt waiting on PT.

## 2020-04-28 NOTE — TOC Progression Note (Signed)
Transition of Care [facility]) - Progression Note    Patient Details  Name: Kevork Joyce MRN: [id number] Date of Birth: [birth year]  Transition of Care [facility]) CM/SW Contact  Marina Goodell Phone Number: [phone] 04/28/2020, 2:36 PM  Clinical Narrative:     CSW sent FL2 and referral to several SNF facilities for bed offer. Preferred placement at [facility].  CSW updated EDP on need for new PT eval.  Expected Discharge Plan: Skilled Nursing Facility Barriers to Discharge: No Barriers Identified  Expected Discharge Plan and Services Expected Discharge Plan: Skilled Nursing Facility In-house Referral: Clinical Social Work     Living arrangements for the past 2 months: Apartment                                       Social Determinants of Health (SDOH) Interventions    Readmission Risk Interventions No flowsheet data found.

## 2020-04-28 NOTE — ED Notes (Signed)
Dinner tray given to pt

## 2020-04-28 NOTE — ED Notes (Signed)
Patient repositioned at this time

## 2020-04-28 NOTE — ED Notes (Signed)
Pt sleeping. 

## 2020-04-28 NOTE — ED Notes (Signed)
Pillow placed under right leg for patient comfort at patient request.

## 2020-04-28 NOTE — ED Notes (Signed)
meds given  Pt eating dinner tray.   

## 2020-04-28 NOTE — ED Notes (Signed)
Pt given breakfast tray

## 2020-04-28 NOTE — ED Notes (Signed)
Patient assisted with repositioning.

## 2020-04-28 NOTE — NC FL2 (Signed)
[place] MEDICAID FL2 LEVEL OF CARE SCREENING TOOL     IDENTIFICATION  Patient Name: Luis Cooper Birthdate: 10-16-1940 Sex: male Admission Date (Current Location): 04/24/2020  [place] and [place] Number:  Chiropodist and Address:  [facility], [address], [place], [place] [address]      Provider Number: [id number]  Attending Physician Name and Address:  No att. providers found  Relative Name and Phone Number:  Rhyatt Muska - brother [phone]    Current Level of Care: Hospital Recommended Level of Care: Skilled Nursing Facility Prior Approval Number:    Date Approved/Denied:   PASRR Number: [id number]  Discharge Plan: SNF    Current Diagnoses: Patient Active Problem List   Diagnosis Date Noted  . Bipolar 1 disorder (HCC) 11/02/2018  . Adjustment disorder 11/02/2018  . Bipolar affective disorder, current episode manic (HCC) 09/15/2018  . Diabetes mellitus without complication (HCC) 09/15/2018  . Gout 09/15/2018    Orientation RESPIRATION BLADDER Height & Weight     Self, Time, Place, Situation  Normal Continent Weight: 150 lb (68 kg) Height:  5\' 2"  (157.5 cm)  BEHAVIORAL SYMPTOMS/MOOD NEUROLOGICAL BOWEL NUTRITION STATUS      Continent Diet  AMBULATORY STATUS COMMUNICATION OF NEEDS Skin   Limited Assist Verbally Normal                       Personal Care Assistance Level of Assistance  Bathing, Dressing Bathing Assistance: Limited assistance   Dressing Assistance: Limited assistance     Functional Limitations Info  Hearing   Hearing Info: Impaired      SPECIAL CARE FACTORS FREQUENCY                       Contractures Contractures Info: Not present    Additional Factors Info                  Current Medications (04/28/2020):  This is the current hospital active medication list Current Facility-Administered Medications  Medication Dose Route Frequency Provider Last Rate Last  Admin  . amLODipine (NORVASC) tablet 10 mg  10 mg Oral Daily 04/30/2020, MD   10 mg at 04/27/20 1014  . divalproex (DEPAKOTE) DR tablet 500 mg  500 mg Oral Q12H 04/29/20, MD   500 mg at 04/27/20 2103  . metFORMIN (GLUCOPHAGE) tablet 1,000 mg  1,000 mg Oral BID WC 2104, MD   1,000 mg at 04/28/20 0855  . OLANZapine (ZYPREXA) tablet 5 mg  5 mg Oral QHS 04/30/20, MD   5 mg at 04/27/20 2103   Current Outpatient Medications  Medication Sig Dispense Refill  . amLODipine (NORVASC) 10 MG tablet Take 1 tablet (10 mg total) by mouth daily. 30 tablet 1  . divalproex (DEPAKOTE) 500 MG DR tablet Take 1 tablet (500 mg total) by mouth in the morning and at bedtime. 60 tablet 11  . ibuprofen (ADVIL) 600 MG tablet Take 1 tablet (600 mg total) by mouth in the morning and at bedtime. 30 tablet 1  . metFORMIN (GLUCOPHAGE) 500 MG tablet Take 2 tablets (1,000 mg total) by mouth 2 (two) times daily with a meal. 30 tablet 1  . OLANZapine (ZYPREXA) 5 MG tablet Take 1 tablet (5 mg total) by mouth at bedtime. 30 tablet 1  . polyethylene glycol (MIRALAX / GLYCOLAX) 17 g packet Take 17 g by mouth daily. 14 each 1     Discharge Medications:  Please see discharge summary for a list of discharge medications.  Relevant Imaging Results:  Relevant Lab Results:   Additional Information SS# [id number]  Joseph Art, LCSWA

## 2020-04-28 NOTE — ED Notes (Signed)
Pt sleeping intermittently.  meds given.

## 2020-04-28 NOTE — ED Notes (Addendum)
Pt assisted to [facility] to have a BM. Pt was able to have a large BM. Pt assisted back to bed. Call light within reach. Pt has no further needs at this time.

## 2020-04-28 NOTE — TOC Progression Note (Signed)
Transition of Care [facility]) - Progression Note    Patient Details  Name: Luis Cooper MRN: [id number] Date of Birth: 19-Oct-1940  Transition of Care [facility]) CM/SW Contact  Marina Goodell Phone Number: [phone] 04/28/2020, 4:04 PM  Clinical Narrative:     CSW spoke with Tresa Endo at [employer], patient's Medicare is still showing terminated.  CSW called and left voicemail for Jagdeep Ancheta patient's brother [phone] requesting update on patient's Medicare status.   Expected Discharge Plan: Skilled Nursing Facility Barriers to Discharge: No Barriers Identified  Expected Discharge Plan and Services Expected Discharge Plan: Skilled Nursing Facility In-house Referral: Clinical Social Work     Living arrangements for the past 2 months: Apartment                                       Social Determinants of Health (SDOH) Interventions    Readmission Risk Interventions No flowsheet data found.

## 2020-04-29 NOTE — TOC Progression Note (Signed)
Transition of Care [facility]) - Progression Note    Patient Details  Name: Luis Cooper MRN: [id number] Date of Birth: 26-Apr-1941  Transition of Care [facility]) CM/SW Contact  [place] Cellar, RN Phone Number: 04/29/2020, 9:45 AM  Clinical Narrative:    Spoke with Bretta Bang, brother, states application for Medicare reinstatement has been issued and he was told it would take 48 hours to be entered into the system. Once application is entered into system [name] will pay the $594 back payment which will trigger Medicare effective. Kelly @ [facility] is aware and following for potential admission to SNF.    Expected Discharge Plan: Skilled Nursing Facility Barriers to Discharge: No Barriers Identified  Expected Discharge Plan and Services Expected Discharge Plan: Skilled Nursing Facility In-house Referral: Clinical Social Work     Living arrangements for the past 2 months: Apartment                                       Social Determinants of Health (SDOH) Interventions    Readmission Risk Interventions No flowsheet data found.

## 2020-04-29 NOTE — TOC Progression Note (Signed)
Transition of Care [facility]) - Progression Note    Patient Details  Name: Luis Cooper MRN: [id number] Date of Birth: 13-Jun-1941  Transition of Care [facility] CM/SW Contact  Marina Goodell Phone Number: [phone] 04/29/2020, 11:55 AM  Clinical Narrative:     CSW left voicemail for [place] at [facility] [phone] for information on bed placement.  Expected Discharge Plan: Skilled Nursing Facility Barriers to Discharge: No Barriers Identified  Expected Discharge Plan and Services Expected Discharge Plan: Skilled Nursing Facility In-house Referral: Clinical Social Work     Living arrangements for the past 2 months: Apartment                                       Social Determinants of Health (SDOH) Interventions    Readmission Risk Interventions No flowsheet data found.

## 2020-04-29 NOTE — ED Notes (Signed)
VOL  PENDING  PLACEMENT 

## 2020-04-29 NOTE — ED Notes (Signed)
Pt sleeping. 

## 2020-04-29 NOTE — ED Notes (Signed)
Pt watching tv

## 2020-04-29 NOTE — TOC Progression Note (Signed)
Transition of Care [facility]) - Progression Note    Patient Details  Name: Luis Cooper MRN: [id number] Date of Birth: 10-27-1940  Transition of Care [facility]) CM/SW Contact  Eloy Cellar, RN Phone Number: 04/29/2020, 11:49 AM  Clinical Narrative:    Spoke to brother, Luis Cooper, who confirmed all required paperwork has been submitted including back payment and he was told medicare will be reinstated by the end of the week. Will update [employer] [facility] to continue checking for active status in order to begin transfer to SNF.    Expected Discharge Plan: Skilled Nursing Facility Barriers to Discharge: No Barriers Identified  Expected Discharge Plan and Services Expected Discharge Plan: Skilled Nursing Facility In-house Referral: Clinical Social Work     Living arrangements for the past 2 months: Apartment                                       Social Determinants of Health (SDOH) Interventions    Readmission Risk Interventions No flowsheet data found.

## 2020-04-29 NOTE — ED Notes (Signed)
Pt provided with water, crackers and peanut butter.

## 2020-04-29 NOTE — Progress Notes (Signed)
Physical Therapy Treatment Patient Details Name: Luis Cooper MRN: [id number] DOB: 08-17-1941 Today's Date: 04/29/2020    History of Present Illness Readmitted to ED secondary to difficulty with ambulation. History includes bipolar, DM, and gout. Cleared from ortho for WBAT with KI donned. Pt reports initial fall with injury on 04/14/20 and has been having difficulty with ambulation.    PT Comments    Pt was awake laying in the dark upon arriving. He greets therapist and is cooperative and pleasant throughout. Reports R LE is feeling better today. He states, " I have no pain." Pt also ask several times how long he has been here and when he will be going home. Therapist discussed concerns and POC. PT continues to recommend DC to SNF to address safety concerns with balance and address deficits with strength/ safe functional mobility. Therapist assisted pt with proper application of RLE knee immobilizer. Pt is WBAT on RLE per ortho. He was able to achieve EOB sitting with supervision. Stood to RW with CGA for safety + gait belt, and ambulate 50 ft with RW. Pt does fatigue quickly and requires vcs for posture correction and staying inside RW. Does have step to pattern. Returned to room and was repositioned in bed with bed alarm in place and call bell in reach. Overall tolerated session well. Pt is high fall risk and will benefit from continued skilled PT at DC to address deficits and assist pt to PLOF.     Follow Up Recommendations  SNF     Equipment Recommendations  None recommended by PT    Recommendations for Other Services       Precautions / Restrictions Precautions Precautions: Fall Required Braces or Orthoses: Knee Immobilizer - Right Knee Immobilizer - Right: On at all times Restrictions Weight Bearing Restrictions: Yes RLE Weight Bearing: Weight bearing as tolerated    Mobility  Bed Mobility Overal bed mobility: Needs Assistance Bed Mobility: Supine to Sit;Sit to Supine      Supine to sit: Supervision Sit to supine: Supervision   General bed mobility comments: therapist had to adjust knee immobilizer to proper fit prior to pt exiting bed. He got off L side of bed without physical assistance.sat EOB x ~ 3 minutes prior to standing.  Transfers Overall transfer level: Needs assistance Equipment used: Rolling walker (2 wheeled) Transfers: Sit to/from Stand Sit to Stand: Min guard         General transfer comment: CGA for safety. Vcs for handplacement and improved safety.  Ambulation/Gait Ambulation/Gait assistance: Min guard Gait Distance (Feet): 50 Feet Assistive device: Rolling walker (2 wheeled) Gait Pattern/deviations: Step-to pattern;Trunk flexed Gait velocity: decreased   General Gait Details: pt was able to ambulate 50 ft with slow step to pattern. Overall states his R leg does not hurt like it was. He has poor gait posture but was able to correct with vcs for a few steps prior to reverting to poor gait kinematics.   Stairs             Wheelchair Mobility    Modified Rankin (Stroke Patients Only)       Balance Overall balance assessment: History of Falls;Needs assistance Sitting-balance support: No upper extremity supported;Feet unsupported Sitting balance-Leahy Scale: Fair Sitting balance - Comments: pt sat EOB x several minutes without LOB   Standing balance support: Bilateral upper extremity supported;During functional activity Standing balance-Leahy Scale: Fair  Cognition Arousal/Alertness: Awake/alert Behavior During Therapy: WFL for tasks assessed/performed Overall Cognitive Status: No family/caregiver present to determine baseline cognitive functioning                                 General Comments: pt was alert throughout but disorineted to time/situation/ and POC going forward. He asked several times when he would be going home. Therapist explained that he  was not a MD and DR will make that call.      Exercises      General Comments        Pertinent Vitals/Pain Pain Assessment: No/denies pain Pain Score: 0-No pain Faces Pain Scale: No hurt Pain Location: R knee Pain Descriptors / Indicators: Discomfort;Dull;Grimacing;Guarding Pain Intervention(s): Limited activity within patient's tolerance;Monitored during session;Repositioned    Home Living                      Prior Function            PT Goals (current goals can now be found in the care plan section) Acute Rehab PT Goals Patient Stated Goal: to get stronger so I can go home Progress towards PT goals: Progressing toward goals    Frequency    Min 2X/week      PT Plan Current plan remains appropriate    Co-evaluation              AM-PAC PT "6 Clicks" Mobility   Outcome Measure  Help needed turning from your back to your side while in a flat bed without using bedrails?: A Little Help needed moving from lying on your back to sitting on the side of a flat bed without using bedrails?: A Little Help needed moving to and from a bed to a chair (including a wheelchair)?: A Little Help needed standing up from a chair using your arms (e.g., wheelchair or bedside chair)?: A Little Help needed to walk in hospital room?: A Little Help needed climbing 3-5 steps with a railing? : A Lot 6 Click Score: 17    End of Session Equipment Utilized During Treatment: Gait belt Activity Tolerance: Patient tolerated treatment well Patient left: in bed;with bed alarm set;with call bell/phone within reach Nurse Communication: Mobility status PT Visit Diagnosis: Muscle weakness (generalized) (M62.81);Difficulty in walking, not elsewhere classified (R26.2)     Time: 1660-6301 PT Time Calculation (min) (ACUTE ONLY): 17 min  Charges:  $Gait Training: 8-22 mins                    Jetta Lout PTA 04/29/20, 1:30 PM

## 2020-04-29 NOTE — ED Notes (Signed)
Pt given water and ice per request, patient readjusted in bed and bed raised up, urinal emptied, call light in place, denies further needs at this time.

## 2020-04-29 NOTE — ED Notes (Signed)
Cereal given to pt.

## 2020-04-29 NOTE — ED Notes (Signed)
Report off to ashton rn  

## 2020-04-29 NOTE — ED Notes (Signed)
Luis Cooper, sister,  (C) [phone]

## 2020-04-30 NOTE — ED Notes (Signed)
Pt given breakfast tray

## 2020-04-30 NOTE — ED Notes (Signed)
Peanut butter given to pt. Pt reassured that lunch is on the way.

## 2020-04-30 NOTE — ED Provider Notes (Signed)
Patient has been accepted to [facility] for rehab.  Pending Medicare authorization.   Jene Every, MD 04/30/20 [phone]

## 2020-04-30 NOTE — TOC Progression Note (Signed)
Transition of Care [facility]) - Progression Note    Patient Details  Name: Luis Cooper MRN: [id number] Date of Birth: 07-Apr-1941  Transition of Care [facility]) CM/SW Contact  [place] Cellar, RN Phone Number: 04/30/2020, 10:12 AM  Clinical Narrative:    Sherron Monday to [facility]. Confirmed patient continues to show as Medicare terminated. Tresa Endo will check twice daily and update once Medicare changed to active.    Expected Discharge Plan: Skilled Nursing Facility Barriers to Discharge: No Barriers Identified  Expected Discharge Plan and Services Expected Discharge Plan: Skilled Nursing Facility In-house Referral: Clinical Social Work     Living arrangements for the past 2 months: Apartment                                       Social Determinants of Health (SDOH) Interventions    Readmission Risk Interventions No flowsheet data found.

## 2020-04-30 NOTE — TOC Progression Note (Signed)
Transition of Care [facility]) - Progression Note    Patient Details  Name: Luis Cooper MRN: [id number] Date of Birth: 1941/04/29  Transition of Care [facility]) CM/SW Contact  [place] Cellar, RN Phone Number: 04/30/2020, 12:15 PM  Clinical Narrative:    Updated patient at bedside regarding plans for transfer to [facility] once medicare approved. Patient eager to discharge and start working with therapy so he can get stronger and return to his home.    Expected Discharge Plan: Skilled Nursing Facility Barriers to Discharge: No Barriers Identified  Expected Discharge Plan and Services Expected Discharge Plan: Skilled Nursing Facility In-house Referral: Clinical Social Work     Living arrangements for the past 2 months: Apartment                                       Social Determinants of Health (SDOH) Interventions    Readmission Risk Interventions No flowsheet data found.

## 2020-04-30 NOTE — ED Notes (Signed)
Pt given meal tray.

## 2020-05-01 NOTE — TOC Progression Note (Signed)
Transition of Care [facility]) - Progression Note    Patient Details  Name: Luis Cooper MRN: [id number] Date of Birth: [birth year]  Transition of Care [facility]) CM/SW Contact  Antelope Cellar, RN Phone Number: 05/01/2020, 11:45 AM  Clinical Narrative:    Sherron Monday with Tresa Endo @ [place] [facility]: Patient continues to show terminated in Medicare however Tresa Endo will continue to check status twice daily.    Expected Discharge Plan: Skilled Nursing Facility Barriers to Discharge: No Barriers Identified  Expected Discharge Plan and Services Expected Discharge Plan: Skilled Nursing Facility In-house Referral: Clinical Social Work     Living arrangements for the past 2 months: Apartment                                       Social Determinants of Health (SDOH) Interventions    Readmission Risk Interventions No flowsheet data found.

## 2020-05-01 NOTE — ED Notes (Signed)
Hand hygiene performed and pt given breakfast tray  

## 2020-05-01 NOTE — ED Notes (Signed)
Pt given meal tray.

## 2020-05-01 NOTE — ED Notes (Signed)
VOLUNTARY continues await placement

## 2020-05-01 NOTE — ED Provider Notes (Signed)
-----------------------------------------   9:55 AM on 05/01/2020 -----------------------------------------   Blood pressure 132/70, pulse 100, temperature 98 F (36.7 C), temperature source Oral, resp. rate 18, height 5\' 2"  (1.575 m), weight 68 kg, SpO2 97 %.  The patient is calm and cooperative at this time.  Patient reports urinating on himself overnight.  There is a check pad on the bed which is soaked.  Will request he be cleaned up.  Awaiting disposition plan from Social Work team(s).   , MD 05/01/20 [phone]

## 2020-05-02 NOTE — ED Notes (Signed)
Patient is resting comfortably. 

## 2020-05-02 NOTE — TOC Progression Note (Signed)
Transition of Care [facility]) - Progression Note    Patient Details  Name: Luis Cooper MRN: [id number] Date of Birth: November 27, 1940  Transition of Care [facility]) CM/SW Contact  [place] Cellar, RN Phone Number: 05/02/2020, 3:18 PM  Clinical Narrative:    Spoke to patients brother, Christorpher Hisaw, states he called Medicare and was notified that his payment for back payment was applied to the wrong account and Medicare is working to correct the error and should have Medicare reinstated within 24-48 hours.    Expected Discharge Plan: Skilled Nursing Facility Barriers to Discharge: No Barriers Identified  Expected Discharge Plan and Services Expected Discharge Plan: Skilled Nursing Facility In-house Referral: Clinical Social Work     Living arrangements for the past 2 months: Apartment                                       Social Determinants of Health (SDOH) Interventions    Readmission Risk Interventions No flowsheet data found.

## 2020-05-02 NOTE — ED Notes (Signed)
Coffee given per pt request  

## 2020-05-02 NOTE — ED Notes (Signed)
Pt sleeping. resps unlabored.  

## 2020-05-02 NOTE — ED Notes (Signed)
Pt has redness and scaly flakes on face and in ears. Bath wipes given, pt washed face and ears, was appreciative.

## 2020-05-02 NOTE — ED Notes (Signed)
Apple sauce provided

## 2020-05-02 NOTE — TOC Progression Note (Signed)
Transition of Care [facility]) - Progression Note    Patient Details  Name: Luis Cooper MRN: [id number] Date of Birth: 07/12/1941  Transition of Care [facility]) CM/SW Contact  [place] Cellar, RN Phone Number: 05/02/2020, 10:40 AM  Clinical Narrative:    Sherron Monday to Tresa Endo @ [place] [facility]-Patient continues to show as terminated with Medicare. Will recheck at 3pm. Will transfer to [facility] once Medicare approved.   Expected Discharge Plan: Skilled Nursing Facility Barriers to Discharge: No Barriers Identified  Expected Discharge Plan and Services Expected Discharge Plan: Skilled Nursing Facility In-house Referral: Clinical Social Work     Living arrangements for the past 2 months: Apartment                                       Social Determinants of Health (SDOH) Interventions    Readmission Risk Interventions No flowsheet data found.

## 2020-05-02 NOTE — ED Notes (Signed)
Ice cream given per pt request.  

## 2020-05-02 NOTE — ED Notes (Signed)
Pt sleeping. resps unlabored, call bell at side.

## 2020-05-02 NOTE — ED Notes (Signed)
Ct declines offer to brush teeth before bed.

## 2020-05-03 LAB — GLUCOSE, CAPILLARY
Glucose-Capillary: 85 mg/dL (ref 70–99)
Glucose-Capillary: 124 mg/dL — ABNORMAL HIGH (ref 70–99)

## 2020-05-03 MED ORDER — KETAMINE HCL 50 MG/ML IJ SOLN: 350.0000 mg | Freq: Once | INTRAMUSCULAR | Status: DC

## 2020-05-03 NOTE — ED Notes (Signed)
Pt continues to sleep. resps unlabored.  

## 2020-05-03 NOTE — ED Notes (Signed)
Pt sleeping. resps unlabored.  

## 2020-05-03 NOTE — ED Notes (Signed)
Bed adjusted for comfort. Pt requesting both siderails up.

## 2020-05-03 NOTE — TOC Progression Note (Signed)
Transition of Care [facility]) - Progression Note    Patient Details  Name: Luis Cooper MRN: [id number] Date of Birth: Jun 10, 1941  Transition of Care [facility]) CM/SW Contact  Larwance Rote, LCSW Phone Number: 05/03/2020, 8:38 AM  Clinical Narrative:   Patient is scheduled to discharge to [facility].  Medicare reinstatement is pending.  Social work team will continue to review for update on Medicare status.  [id number]   Expected Discharge Plan: Skilled Nursing Facility Barriers to Discharge: No Barriers Identified  Expected Discharge Plan and Services Expected Discharge Plan: Skilled Nursing Facility In-house Referral: Clinical Social Work     Living arrangements for the past 2 months: Apartment                                       Social Determinants of Health (SDOH) Interventions    Readmission Risk Interventions No flowsheet data found.

## 2020-05-03 NOTE — ED Notes (Signed)
Report to hunter, rn.  

## 2020-05-03 NOTE — ED Notes (Signed)
Meal tray provided.

## 2020-05-03 NOTE — ED Notes (Signed)
Pt sleeping. 

## 2020-05-03 NOTE — ED Notes (Signed)
Lisa ed tech checked on pt for hourly rounding.

## 2020-05-03 NOTE — ED Notes (Signed)
Pt declines offer to change dirty shirt. Ice cream provided, tooth brushing supplies provided. Additional po fluids provided, full urinal emptied. Pt watching tv in no acute distress.

## 2020-05-04 LAB — GLUCOSE, CAPILLARY: Glucose-Capillary: 94 mg/dL (ref 70–99)

## 2020-05-04 NOTE — ED Provider Notes (Signed)
Emergency Medicine Observation Re-evaluation Note  Luis Cooper is a 79 y.o. male, seen on rounds today.  Pt initially presented to the ED for complaints of Weakness Currently, the patient is awaiting placement  Physical Exam  BP 128/68   Pulse 85   Temp 98.2 F (36.8 C) (Oral)   Resp 14   Ht 5\' 2"  (1.575 m)   Wt 68 kg   SpO2 92%   BMI 27.44 kg/m  Physical Exam Constitutional: Patient sleeping comfortably in bed. Respiratory: Patient breathing easily no respiratory distress ED Course / MDM  EKG:EKG Interpretation  Date/Time:  Thursday April 24 2020 16:15:35 EDT Ventricular Rate:  116 PR Interval:  150 QRS Duration: 78 QT Interval:  326 QTC Calculation: 453 R Axis:   20 Text Interpretation: Sinus tachycardia Cannot rule out Anterior infarct , age undetermined Abnormal ECG Confirmed by UNCONFIRMED, DOCTOR (12-07-1972), editor [id number], Tammy [id number]) on 04/25/2020 8:43:27 AM     Plan  Patient awaiting Geri psych placement.  Labs for the last 10 days reviewed.   04/27/2020, MD 05/04/20 [phone]

## 2020-05-04 NOTE — TOC Progression Note (Signed)
Transition of Care [facility]) - Progression Note    Patient Details  Name: Luis Cooper MRN: [id number] Date of Birth: 03-Dec-1940  Transition of Care [facility]) CM/SW Contact  [place] Cellar, RN Phone Number: 05/04/2020, 9:09 AM  Clinical Narrative:    Spoke to brother, [name], confirmed second attempt at payment to Medicare was completed. Will continue to monitor for Medicare reinstatement for SNF transfer to [facility].    Expected Discharge Plan: Skilled Nursing Facility Barriers to Discharge: No Barriers Identified  Expected Discharge Plan and Services Expected Discharge Plan: Skilled Nursing Facility In-house Referral: Clinical Social Work     Living arrangements for the past 2 months: Apartment                                       Social Determinants of Health (SDOH) Interventions    Readmission Risk Interventions No flowsheet data found.

## 2020-05-04 NOTE — ED Notes (Signed)
Po fluids provided. Pt in no acute distress.

## 2020-05-04 NOTE — ED Notes (Signed)
Report to jennifer, rn.  

## 2020-05-04 NOTE — Progress Notes (Addendum)
Physical Therapy Treatment Patient Details Name: Luis Cooper MRN: [id number] DOB: 24-Jan-1941 Today's Date: 05/04/2020    History of Present Illness Readmitted to ED secondary to difficulty with ambulation. History includes bipolar, DM, and gout. Cleared from ortho for WBAT with KI donned. Pt reports initial fall with injury on 04/14/20 and has been having difficulty with ambulation.    PT Comments    Ready for session.  To edge of stretcher with ease.  Stood and is able to walk 3 laps in room, short seated rest then another lap prior to sitting in recliner for lunch.  Chair alarm on. Slow but steady gait with walker.  Follow Up Recommendations  SNF     Equipment Recommendations  None recommended by PT    Recommendations for Other Services       Precautions / Restrictions Precautions Precautions: Fall Required Braces or Orthoses: Knee Immobilizer - Right Knee Immobilizer - Right: On at all times Restrictions Weight Bearing Restrictions: Yes RLE Weight Bearing: Weight bearing as tolerated    Mobility  Bed Mobility Overal bed mobility: Needs Assistance Bed Mobility: Supine to Sit     Supine to sit: Supervision        Transfers Overall transfer level: Needs assistance Equipment used: Rolling walker (2 wheeled) Transfers: Sit to/from Stand Sit to Stand: Min guard            Ambulation/Gait Ambulation/Gait assistance: Min guard Gait Distance (Feet): 50 Feet Assistive device: Rolling walker (2 wheeled) Gait Pattern/deviations: Step-to pattern;Trunk flexed Gait velocity: decreased       Stairs             Wheelchair Mobility    Modified Rankin (Stroke Patients Only)       Balance Overall balance assessment: History of Falls;Needs assistance Sitting-balance support: No upper extremity supported;Feet unsupported Sitting balance-Leahy Scale: Good     Standing balance support: Bilateral upper extremity supported;During functional  activity Standing balance-Leahy Scale: Fair                              Cognition Arousal/Alertness: Awake/alert Behavior During Therapy: WFL for tasks assessed/performed Overall Cognitive Status: Within Functional Limits for tasks assessed                                 General Comments: less confusion noted today.      Exercises      General Comments        Pertinent Vitals/Pain Pain Assessment: Faces Faces Pain Scale: Hurts a little bit Pain Location: R knee Pain Descriptors / Indicators: Discomfort;Dull;Grimacing;Guarding Pain Intervention(s): Limited activity within patient's tolerance;Monitored during session    Home Living                      Prior Function            PT Goals (current goals can now be found in the care plan section) Progress towards PT goals: Progressing toward goals    Frequency    Min 2X/week      PT Plan Current plan remains appropriate    Co-evaluation              AM-PAC PT "6 Clicks" Mobility   Outcome Measure  Help needed turning from your back to your side while in a flat bed without using bedrails?: None Help needed moving from lying  on your back to sitting on the side of a flat bed without using bedrails?: None Help needed moving to and from a bed to a chair (including a wheelchair)?: A Little Help needed standing up from a chair using your arms (e.g., wheelchair or bedside chair)?: A Little Help needed to walk in hospital room?: A Little Help needed climbing 3-5 steps with a railing? : A Little 6 Click Score: 20    End of Session Equipment Utilized During Treatment: Gait belt Activity Tolerance: Patient tolerated treatment well Patient left: in chair;with call bell/phone within reach;with chair alarm set Nurse Communication: Mobility status       Time: 8638-1771 PT Time Calculation (min) (ACUTE ONLY): 17 min  Charges:  $Gait Training: 8-22 mins                     Danielle Dess, PTA 05/04/20, 12:36 PM

## 2020-05-04 NOTE — ED Notes (Signed)
Pt laying in bed, tv on high volume, pt no apparent distress, vanilla ice cream given - chocolate cleaned up, pt has call bell and water at bedside

## 2020-05-04 NOTE — ED Notes (Signed)
Pt sleeping. resps unlabored.  

## 2020-05-04 NOTE — ED Notes (Signed)
Pt assisted up to commode for bowel movement. Sheets changed. Pt declines offer to change clothing.

## 2020-05-05 LAB — GLUCOSE, CAPILLARY
Glucose-Capillary: 156 mg/dL — ABNORMAL HIGH (ref 70–99)
Glucose-Capillary: 84 mg/dL (ref 70–99)
Glucose-Capillary: 93 mg/dL (ref 70–99)
Glucose-Capillary: 82 mg/dL (ref 70–99)

## 2020-05-05 NOTE — ED Notes (Signed)
Patient refused his metformin, stated his doctor told him he did not need to be on it because his weight is low

## 2020-05-05 NOTE — ED Notes (Signed)
VOL  PENDING  PLACEMENT 

## 2020-05-05 NOTE — ED Notes (Signed)
Patient watching television no complaints but "where is dinner"

## 2020-05-05 NOTE — ED Notes (Signed)
Pt given PB and crackers with apple sauce

## 2020-05-05 NOTE — TOC Progression Note (Addendum)
Transition of Care [facility]) - Progression Note    Patient Details  Name: Luis Cooper MRN: [id number] Date of Birth: 1941-07-03  Transition of Care [facility]) CM/SW Contact  Marina Goodell Phone Number: [phone] 05/05/2020, 11:04 AM  Clinical Narrative:     CSW spoke with Tresa Endo at [facility].  Patient's Medicare still shows "terminated'.  Bed offer pending on Medicare reinstatement. Patient is not safe to return home and landlord will not allow him to return to home until he is able to ambulate and perform ADLs.  Patient does not qualify for Medicaid and refuses to sell assets to pay for an ALF facility out of pocket.   Expected Discharge Plan: Skilled Nursing Facility Barriers to Discharge: No Barriers Identified  Expected Discharge Plan and Services Expected Discharge Plan: Skilled Nursing Facility In-house Referral: Clinical Social Work     Living arrangements for the past 2 months: Apartment                                       Social Determinants of Health (SDOH) Interventions    Readmission Risk Interventions No flowsheet data found.

## 2020-05-05 NOTE — ED Notes (Signed)
Hourly rounding reveals patient sleeping in room. No complaints, stable, in no acute distress. Q15 minute rounds and monitoring to continue. 

## 2020-05-05 NOTE — ED Notes (Signed)
Patient given breakfast tray.

## 2020-05-06 LAB — GLUCOSE, CAPILLARY: Glucose-Capillary: 85 mg/dL (ref 70–99)

## 2020-05-06 NOTE — ED Provider Notes (Signed)
Emergency Medicine Observation Re-evaluation Note  Luis Cooper is a 79 y.o. male, seen on rounds today.  Pt initially presented to the ED for complaints of Weakness Currently, the patient is awaiting [facility] psych placement.  Resting comfortably without any complaints  Physical Exam  BP (!) 122/64 (BP Location: Right Arm)    Pulse 87    Temp 98.1 F (36.7 C) (Oral)    Resp 16    Ht 1.575 m (5\' 2" )    Wt 68 kg    SpO2 94%    BMI 27.44 kg/m    Physical exam: Constitutional: No apparent distress resting comfortably Respiratory: no increased work of breathing, no adventitious breath sounds Neurological: Normal strength immitis no focal deficits  ED Course / MDM  EKG:EKG Interpretation  Date/Time:  Thursday April 24 2020 16:15:35 EDT Ventricular Rate:  116 PR Interval:  150 QRS Duration: 78 QT Interval:  326 QTC Calculation: 453 R Axis:   20 Text Interpretation: Sinus tachycardia Cannot rule out Anterior infarct , age undetermined Abnormal ECG Confirmed by UNCONFIRMED, DOCTOR (12-07-1972), editor [id number], Tammy [phone]) on 04/25/2020 8:43:27 AM    I have reviewed the labs performed to date as well as medications administered while in observation.  No issues overnight. Plan  Current plan is for awaiting placement. Patient is not under full IVC at this time.   04/27/2020, MD 05/06/20 [phone]

## 2020-05-06 NOTE — ED Notes (Signed)
Pt provided with lunch tray.

## 2020-05-06 NOTE — ED Notes (Signed)
Pt sleeping. 

## 2020-05-06 NOTE — ED Notes (Signed)
Assessment completed  - he is here voluntary - social work consult is in progress pt awaiting medicare approval so that he may go to a skilled facility   He denies pain  Pt states  "I am ready to go - why is it taking so long to find me a place to go"   I gave him updated information from social work note yesterday  He verbalizes understanding and agreement  He then states  "I am ready to go though"

## 2020-05-06 NOTE — ED Notes (Signed)
Resumed care from dejea rn.   Pt alert watching tv.  No pain.

## 2020-05-06 NOTE — ED Notes (Signed)
Pt asleep, meal tray placed on bedside table.

## 2020-05-06 NOTE — ED Notes (Signed)
Pt given breakfast tray

## 2020-05-06 NOTE — ED Notes (Signed)
Pt watching tv  Pt alert.   

## 2020-05-07 NOTE — ED Notes (Signed)
VOLUNTARY/continues to await placement 

## 2020-05-07 NOTE — ED Notes (Signed)
Pt given ice water and warm blanket at this time. No other needs voiced.

## 2020-05-07 NOTE — ED Notes (Signed)
Pt a/o see vs. Moved from chair to bed per request. nad

## 2020-05-07 NOTE — ED Notes (Signed)
Pt sleeping. 

## 2020-05-07 NOTE — ED Notes (Signed)
Report to include Situation, Background, Assessment, and Recommendations received from RN. Patient alert and oriented, warm and dry, in no acute distress. Patient denies SI, HI, AVH and pain. Patient made aware of Q15 minute rounds and Rover and Officer presence for their safety. Patient instructed to come to me with needs or concerns.   

## 2020-05-07 NOTE — ED Notes (Signed)
Pt provided with breakfast tray.

## 2020-05-07 NOTE — ED Notes (Signed)
Pt requesting bedside commode; pt able to successfully use bedside commode without assistance. Pt had a BM and this tech emptied/cleaned bedside commode. Pt return to be and resting at this time. No other needs voiced. Will continue to monitor Q15 minute rounds.

## 2020-05-07 NOTE — ED Provider Notes (Signed)
Emergency Medicine Observation Re-evaluation Note  Luis Cooper is a 79 y.o. male, seen on rounds today.  Pt initially presented to the ED for complaints of Weakness Currently, the patient is sleeping.  Physical Exam  BP (!) 116/60 (BP Location: Right Arm)   Pulse 58   Temp 97.7 F (36.5 C) (Oral)   Resp 20   Ht 1.575 m (5\' 2" )   Wt 68 kg   SpO2 99%   BMI 27.44 kg/m  Physical Exam   Gen:  no acute distress Resp:  no difficulty breathing, no accessory muscle usage Neuro:  moving all four extremities Psych:  calm and cooperative  ED Course / MDM  EKG:EKG Interpretation  Date/Time:  Thursday April 24 2020 16:15:35 EDT Ventricular Rate:  116 PR Interval:  150 QRS Duration: 78 QT Interval:  326 QTC Calculation: 453 R Axis:   20 Text Interpretation: Sinus tachycardia Cannot rule out Anterior infarct , age undetermined Abnormal ECG Confirmed by UNCONFIRMED, DOCTOR (12-07-1971), editor [id number], Tammy [phone]) on 04/25/2020 8:43:27 AM    I have reviewed the labs performed to date as well as medications administered while in observation.  No clinically significant changes in the last 24 hours. Plan  Current plan is for social work placement recommendations.. Patient is not under full IVC at this time.   04/27/2020, MD 05/07/20 [phone]

## 2020-05-07 NOTE — Progress Notes (Signed)
Physical Therapy Treatment Patient Details Name: Luis Cooper MRN: [id number] DOB: 07/22/1941 Today's Date: 05/07/2020    History of Present Illness Readmitted to ED secondary to difficulty with ambulation. History includes bipolar, DM, and gout. Cleared from ortho for WBAT with KI donned. Pt reports initial fall with injury on 04/14/20 and has been having difficulty with ambulation.    PT Comments    Pt was awake laying in supine watching TV upon arriving. He ia A and O x 4 and agreeable to OOB activity. Pt states," I want to get out of the hospital." Therapist discussed/ educated pt on POC. He was able to exit L side of bed with knee immobilizer donned with supervision only. Stood and ambulated with RW with CGA. Breakfast arrived and pt was repositioned in recliner at conclusion of session with call bell in reach, chair alarm in place, and pt's BLEs elevated. Pt will benefit form continued skilled PT at STR to address strength, balance, and safe functional mobility deficits. Does not endorse R LE pain throughout session.     Follow Up Recommendations  SNF     Equipment Recommendations  None recommended by PT    Recommendations for Other Services       Precautions / Restrictions Precautions Precautions: Fall Required Braces or Orthoses: Knee Immobilizer - Right Knee Immobilizer - Right: On at all times Restrictions Weight Bearing Restrictions: Yes RLE Weight Bearing: Weight bearing as tolerated    Mobility  Bed Mobility Overal bed mobility: Needs Assistance Bed Mobility: Supine to Sit     Supine to sit: Supervision     General bed mobility comments: Pt was able to safely exit L side of bed with knee immobilizer donned. supervision for safety  Transfers Overall transfer level: Needs assistance Equipment used: Rolling walker (2 wheeled) Transfers: Sit to/from Stand Sit to Stand: Min guard         General transfer comment: CGA for safety to STS from elevated bed  height. Vcs for improved technique for improved safety.  Ambulation/Gait Ambulation/Gait assistance: Min guard Gait Distance (Feet): 50 Feet Assistive device: Rolling walker (2 wheeled) Gait Pattern/deviations: Step-to pattern;Trunk flexed Gait velocity: decreased   General Gait Details: pt was able to ambulate in room with CGA . no LOB noted. Gait distances limited by breakfast arriving.   Stairs             Wheelchair Mobility    Modified Rankin (Stroke Patients Only)       Balance Overall balance assessment: History of Falls;Needs assistance Sitting-balance support: No upper extremity supported;Feet unsupported Sitting balance-Leahy Scale: Good Sitting balance - Comments: pt was able to sit in recliner without UE support without LOB/unsteadiness   Standing balance support: Bilateral upper extremity supported;During functional activity Standing balance-Leahy Scale: Good Standing balance comment: CGA for safety with all standing activity                            Cognition Arousal/Alertness: Awake/alert Behavior During Therapy: WFL for tasks assessed/performed Overall Cognitive Status: Within Functional Limits for tasks assessed                                 General Comments: Pt is alert and able to follow commands consistently. Very motivated to get to rehab and out of hospital. Oriented to time, day, location and situation.      Exercises  General Comments        Pertinent Vitals/Pain Pain Assessment: No/denies pain Pain Score: 0-No pain Faces Pain Scale: No hurt Pain Location: R knee Pain Intervention(s): Limited activity within patient's tolerance;Monitored during session    Home Living                      Prior Function            PT Goals (current goals can now be found in the care plan section) Acute Rehab PT Goals Patient Stated Goal: I want to go to rehab today Progress towards PT goals:  Progressing toward goals    Frequency    Min 2X/week      PT Plan Current plan remains appropriate    Co-evaluation              AM-PAC PT "6 Clicks" Mobility   Outcome Measure  Help needed turning from your back to your side while in a flat bed without using bedrails?: None Help needed moving from lying on your back to sitting on the side of a flat bed without using bedrails?: None Help needed moving to and from a bed to a chair (including a wheelchair)?: A Little Help needed standing up from a chair using your arms (e.g., wheelchair or bedside chair)?: A Little Help needed to walk in hospital room?: A Little Help needed climbing 3-5 steps with a railing? : A Little 6 Click Score: 20    End of Session Equipment Utilized During Treatment: Gait belt Activity Tolerance: Patient tolerated treatment well Patient left: in chair;with call bell/phone within reach;with chair alarm set Nurse Communication: Mobility status PT Visit Diagnosis: Muscle weakness (generalized) (M62.81);Difficulty in walking, not elsewhere classified (R26.2)     Time: 2229-7989 PT Time Calculation (min) (ACUTE ONLY): 20 min  Charges:  $Gait Training: 8-22 mins                     Jetta Lout PTA 05/07/20, 9:44 AM

## 2020-05-07 NOTE — ED Notes (Signed)
Pt given lunch tray.

## 2020-05-08 NOTE — ED Notes (Signed)
Report to include Situation, Background, Assessment, and Recommendations received from RN. Patient alert and oriented, warm and dry, in no acute distress. Patient denies SI, HI, AVH and pain. Patient made aware of Q15 minute rounds and Rover and Officer presence for their safety. Patient instructed to come to me with needs or concerns.   

## 2020-05-08 NOTE — ED Notes (Signed)
Snacks given 

## 2020-05-08 NOTE — ED Provider Notes (Signed)
Emergency Medicine Observation Re-evaluation Note  Luis Cooper is a 79 y.o. male, seen on rounds today.  Pt initially presented to the ED for complaints of Weakness Currently, the patient is resting.  Physical Exam  BP (!) 117/63 (BP Location: Left Arm)   Pulse 92   Temp 98 F (36.7 C) (Oral)   Resp 18   Ht 5\' 2"  (1.575 m)   Wt 68 kg   SpO2 96%   BMI 27.44 kg/m  Physical Exam  Gen:  no acute distress Resp:  no difficulty breathing, no accessory muscle usage Neuro:  moving all four extremities Psych:  calm and cooperative   ED Course / MDM  EKG:EKG Interpretation  Date/Time:  Thursday April 24 2020 16:15:35 EDT Ventricular Rate:  116 PR Interval:  150 QRS Duration: 78 QT Interval:  326 QTC Calculation: 453 R Axis:   20 Text Interpretation: Sinus tachycardia Cannot rule out Anterior infarct , age undetermined Abnormal ECG Confirmed by UNCONFIRMED, DOCTOR (12-07-1971), editor [id number], Tammy [phone]) on 04/25/2020 8:43:27 AM    I have reviewed the labs performed to date as well as medications administered while in observation.  Recent changes in the last 24 hours include none. Plan  Current plan is for psych/SW/CM dispo. Patient is under full IVC at this time.   04/27/2020, MD 05/08/20 [phone]

## 2020-05-08 NOTE — ED Notes (Signed)
Patient was given a pan of water to take a sponge bath at this time ,patient refused to have help but ask patient to call if needed any help.

## 2020-05-09 NOTE — ED Notes (Signed)
Pt given breakfast tray

## 2020-05-09 NOTE — ED Notes (Signed)
Pt given dinner tray.

## 2020-05-09 NOTE — ED Notes (Signed)
Hourly rounding reveals patient sleeping in room. No complaints, stable, in no acute distress. Q15 minute rounds and monitoring via Rover and Officer to continue.  

## 2020-05-09 NOTE — ED Notes (Signed)
Hourly rounding reveals patient awake in room. No complaints, stable, in no acute distress. Q15 minute rounds and monitoring via Rover and Officer to continue.  

## 2020-05-09 NOTE — ED Notes (Signed)
Received call from brother, would like to arrange a 3 way call with Social security and the patient. Was requesting what is an appropriate time frame.

## 2020-05-09 NOTE — ED Notes (Signed)
Report to include situation, background, assessment and recommendations from [facility]. Patient sleeping, respirations regular and unlabored. Q15 minute rounds and officer observation to continue.

## 2020-05-09 NOTE — ED Notes (Signed)
Patient alert, warm and dry, in no acute distress. Patient denies SI, HI, AVH and pain. Patient made aware of Q15 minute rounds and Rover and Officer presence for their safety. Patient instructed to come to me with needs or concerns.  

## 2020-05-09 NOTE — ED Provider Notes (Signed)
Emergency Medicine Observation Re-evaluation Note  Micajah Dennin is a 79 y.o. male, seen on rounds today.  Pt initially presented to the ED for complaints of Weakness Currently, the patient is at baseline with no complaints this am.  Physical Exam  BP (!) 108/52 (BP Location: Left Arm)   Pulse 92   Temp 98.3 F (36.8 C) (Oral)   Resp 16   Ht 5\' 2"  (1.575 m)   Wt 68 kg   SpO2 95%   BMI 27.44 kg/m  Physical Exam   General: No apparent distress HEENT: moist mucous membranes CV: RRR Pulm: Normal WOB GI: soft and non tender MSK: no edema or cyanosis Neuro: face symmetric, moving all extremities    ED Course / MDM    I have reviewed the labs performed to date as well as medications administered while in observation.  No acute changes or labs overnight Plan  Current plan is for psych/SW dispo. Patient is under full IVC at this time.   , MD 05/09/20 [phone]

## 2020-05-10 LAB — GLUCOSE, CAPILLARY: Glucose-Capillary: 99 mg/dL (ref 70–99)

## 2020-05-10 LAB — SARS CORONAVIRUS 2 BY RT PCR (HOSPITAL ORDER, PERFORMED IN ~~LOC~~ HOSPITAL LAB): SARS Coronavirus 2: NEGATIVE

## 2020-05-10 NOTE — ED Notes (Signed)
Hourly rounding reveals patient sleeping in room. No complaints, stable, in no acute distress. Q15 minute rounds and monitoring via Rover and Officer to continue.  

## 2020-05-10 NOTE — ED Notes (Signed)
PT at bedside walking pt.

## 2020-05-10 NOTE — ED Notes (Signed)
Pt given warm wipes to bathe with due to shower not working. States that he will do it later and does not want our help.

## 2020-05-10 NOTE — ED Notes (Signed)
Pt given dinner tray. Pt still resting.

## 2020-05-10 NOTE — ED Notes (Signed)
Breakfast sat at bedside. Pt given water.

## 2020-05-10 NOTE — ED Provider Notes (Signed)
Emergency Medicine Observation Re-evaluation Note  Luis Cooper is a 79 y.o. male, seen on rounds today.  Pt initially presented to the ED for complaints of Weakness Currently, the patient is resting comfortably underneath blanket, but easily awakens and asking about his placement.  Requesting water.[name]  Physical Exam  BP 116/76 (BP Location: Right Arm)    Pulse 98    Temp 98.8 F (37.1 C) (Oral)    Resp 18    Ht 5\' 2"  (1.575 m)    Wt 68 kg    SpO2 96%    BMI 27.44 kg/m  Physical Exam Constitutional:      General: He is not in acute distress.    Appearance: He is not toxic-appearing.  HENT:     Head: Normocephalic.  Pulmonary:     Effort: Pulmonary effort is normal.  Abdominal:     General: There is no distension.  Musculoskeletal:        General: No deformity.  Skin:    Coloration: Skin is not jaundiced or pale.  Neurological:     General: No focal deficit present.     Mental Status: He is alert.  Psychiatric:        Behavior: Behavior normal.     ED Course / MDM  EKG:EKG Interpretation  Date/Time:  Thursday April 24 2020 16:15:35 EDT Ventricular Rate:  116 PR Interval:  150 QRS Duration: 78 QT Interval:  326 QTC Calculation: 453 R Axis:   20 Text Interpretation: Sinus tachycardia Cannot rule out Anterior infarct , age undetermined Abnormal ECG Confirmed by UNCONFIRMED, DOCTOR ([birth year]-07-1971), editor [id number], Tammy [phone]) on 04/25/2020 8:43:27 AM    I have reviewed the labs performed to date as well as medications administered while in observation.  Recent changes in the last 24 hours include no significant change, awaiting placement. Plan  Current plan is for voluntary placement. Patient is not under full IVC at this time.   04/27/2020, MD 05/10/20 [phone]

## 2020-05-10 NOTE — Progress Notes (Signed)
Physical Therapy Treatment Patient Details Name: Luis Cooper MRN: [id number] DOB: 07-27-1941 Today's Date: 05/10/2020    History of Present Illness Readmitted to ED secondary to difficulty with ambulation. History includes bipolar, DM, and gout. Cleared from ortho for WBAT with KI donned. Pt reports initial fall with injury on 04/14/20 and has been having difficulty with ambulation.    PT Comments    Pt was awake laying supine in bed upon arriving. He is A and O x 3. Agreeable to PT session and cooperative and pleasant throughout. Had R knee immobilizer donned throughout session. He required no physical assistance to exit L side of bed. Stood to [employer] and ambulated ~ 175 ft without LOB or unsteadiness. Supervision only for safety. Pt is progressing well. Acute PT recommends pt has supervision for all mobility due to history of falls. At conclusion of session, pt returned to supine in bed.    Follow Up Recommendations  Supervision/Assistance - 24 hour;Supervision for mobility/OOB     Equipment Recommendations  Rolling walker with 5" wheels    Recommendations for Other Services       Precautions / Restrictions Precautions Precautions: Fall Required Braces or Orthoses: Knee Immobilizer - Right Knee Immobilizer - Right: On at all times Restrictions Weight Bearing Restrictions: Yes RLE Weight Bearing: Weight bearing as tolerated    Mobility  Bed Mobility Overal bed mobility: Modified Independent Bed Mobility: Supine to Sit;Sit to Supine     Supine to sit: Modified independent (Device/Increase time)     General bed mobility comments: Increased time to perform only. no physical assistance or cueing  Transfers Overall transfer level: Needs assistance Equipment used: Rolling walker (2 wheeled) Transfers: Sit to/from Stand Sit to Stand: Supervision         General transfer comment: pt was able to stand from lowest bed height without physical  assistance.  Ambulation/Gait Ambulation/Gait assistance: Supervision Gait Distance (Feet): 175 Feet Assistive device: Rolling walker (2 wheeled) Gait Pattern/deviations: Step-to pattern;Trunk flexed Gait velocity: decreased   General Gait Details: pt demonstrated safe steady gait kinematics without LOB or unsteadiness   Stairs             Wheelchair Mobility    Modified Rankin (Stroke Patients Only)       Balance Overall balance assessment: History of Falls;Needs assistance Sitting-balance support: Feet supported Sitting balance-Leahy Scale: Normal Sitting balance - Comments: no LOB in sitting    Standing balance support: Bilateral upper extremity supported;During functional activity Standing balance-Leahy Scale: Good Standing balance comment: pt has no LOB or unsteadiness throughout session                            Cognition Arousal/Alertness: Awake/alert Behavior During Therapy: WFL for tasks assessed/performed Overall Cognitive Status: Within Functional Limits for tasks assessed                                 General Comments: Pt is alert and able to follow commands consistently. Very motivated to get to rehab and out of hospital. Oriented to time, day, location and situation.      Exercises      General Comments        Pertinent Vitals/Pain Pain Assessment: No/denies pain Pain Score: 0-No pain Faces Pain Scale: No hurt Pain Location: R knee Pain Descriptors / Indicators: Sore Pain Intervention(s): Limited activity within patient's tolerance;Monitored during session  Home Living                      Prior Function            PT Goals (current goals can now be found in the care plan section) Acute Rehab PT Goals Patient Stated Goal: I want to go to rehab today Progress towards PT goals: Progressing toward goals    Frequency    Min 2X/week      PT Plan Discharge plan needs to be updated     Co-evaluation              AM-PAC PT "6 Clicks" Mobility   Outcome Measure  Help needed turning from your back to your side while in a flat bed without using bedrails?: None Help needed moving from lying on your back to sitting on the side of a flat bed without using bedrails?: None Help needed moving to and from a bed to a chair (including a wheelchair)?: None Help needed standing up from a chair using your arms (e.g., wheelchair or bedside chair)?: None Help needed to walk in hospital room?: None Help needed climbing 3-5 steps with a railing? : A Little 6 Click Score: 23    End of Session Equipment Utilized During Treatment: Right knee immobilizer Activity Tolerance: Patient tolerated treatment well Patient left: in bed;with call bell/phone within reach Nurse Communication: Mobility status PT Visit Diagnosis: Muscle weakness (generalized) (M62.81);Difficulty in walking, not elsewhere classified (R26.2)     Time: 5361-4431 PT Time Calculation (min) (ACUTE ONLY): 13 min  Charges:  $Gait Training: 8-22 mins                     Jetta Lout PTA 05/10/20, 10:08 AM

## 2020-05-10 NOTE — ED Notes (Signed)
Pt meal tray sat at bedside

## 2020-05-10 NOTE — ED Notes (Signed)
Breakfast tray left at bedside. 

## 2020-05-11 LAB — GLUCOSE, CAPILLARY: Glucose-Capillary: 94 mg/dL (ref 70–99)

## 2020-05-11 NOTE — ED Notes (Signed)
Patient given snack.  

## 2020-05-11 NOTE — ED Notes (Signed)
breakfast tray given

## 2020-05-11 NOTE — ED Provider Notes (Signed)
Emergency Medicine Observation Re-evaluation Note  Luis Cooper is a 79 y.o. male, seen on rounds today.  Pt initially presented to the ED for complaints of Weakness Currently, the patient is stable with no complaints overnight.  Physical Exam  BP 115/71 (BP Location: Left Arm)   Pulse 93   Temp 98.1 F (36.7 C) (Oral)   Resp 20   Ht 5\' 2"  (1.575 m)   Wt 68 kg   SpO2 97%   BMI 27.44 kg/m  Physical Exam   General: No apparent distress HEENT: moist mucous membranes CV: RRR Pulm: Normal WOB GI: soft and non tender MSK: no edema or cyanosis Neuro: face symmetric, moving all extremities    ED Course / MDM    I have reviewed the labs performed to date as well as medications administered while in observation.  No changes overnight or new labs. Plan  Current plan is for social work placement. Patient is not under full IVC at this time.   , Don Perking, MD 05/11/20 [phone]

## 2020-05-12 LAB — GLUCOSE, CAPILLARY: Glucose-Capillary: 82 mg/dL (ref 70–99)

## 2020-05-12 NOTE — ED Notes (Signed)
Pt asleep, meal tray placed in rm.  

## 2020-05-12 NOTE — ED Notes (Signed)
Pt given dinner tray and ice water at this time; this tech threw away some trays that were sitting at bedside from earlier. No other needs voiced. Will continue to monitor Q15 minute rounds.

## 2020-05-12 NOTE — TOC Progression Note (Addendum)
Transition of Care [facility]) - Progression Note    Patient Details  Name: Luis Cooper MRN: [id number] Date of Birth: 1941-01-18  Transition of [facility]) CM/SW Contact  [place], RN Phone Number: 05/12/2020, 10:35 AM  Clinical Narrative:    Spoke to brother, [name], confirmed he had spoken to [facility] and confirmed payment was received for back payment on 7/21 and they are waiting on Medicare to update social security in order for patient to become active with Medicare benefits. Medicare advised nothing else to be done except wait for communication between Social Security and [employer].   [name] states he is planning to pay patients rents today so he does not lose his room once he is stronger and able to return to home.   RN CM left message for [employer] [facility] [employer] [facility] [place] to update on status and anticipating transfer.  Per Tresa Endo patient continues to show terminated under Medicare.    Expected Discharge Plan: Skilled Nursing Facility Barriers to Discharge: No Barriers Identified  Expected Discharge Plan and Services Expected Discharge Plan: Skilled Nursing Facility In-house Referral: Clinical Social Work     Living arrangements for the past 2 months: Apartment                                       Social Determinants of Health (SDOH) Interventions    Readmission Risk Interventions No flowsheet data found.

## 2020-05-12 NOTE — ED Notes (Signed)

## 2020-05-13 LAB — GLUCOSE, CAPILLARY
Glucose-Capillary: 133 mg/dL — ABNORMAL HIGH (ref 70–99)
Glucose-Capillary: 133 mg/dL — ABNORMAL HIGH (ref 70–99)

## 2020-05-13 NOTE — Progress Notes (Signed)
Physical Therapy Treatment Patient Details Name: Luis Cooper MRN: [id number] DOB: 1941-04-28 Today's Date: 05/13/2020    History of Present Illness Readmitted to ED secondary to difficulty with ambulation. History includes bipolar, DM, and gout. Cleared from ortho for WBAT with KI donned. Pt reports initial fall with injury on 04/14/20 and has been having difficulty with ambulation.    PT Comments    Pt was supine asleep in bed upon arriving. He easily awakes and agrees to PT session. Cooperative and pleasant throughout. Pt required no assistance to exit bed, stand, or ambulate 300 ft with RW. Overall pt demonstrates much improved safety with all safe functional mobility.Acute PT to sign off.      Follow Up Recommendations  Supervision/Assistance - 24 hour     Equipment Recommendations  Rolling walker with 5" wheels    Recommendations for Other Services       Precautions / Restrictions Precautions Precautions: Fall Required Braces or Orthoses: Knee Immobilizer - Right Knee Immobilizer - Right: On at all times Restrictions Weight Bearing Restrictions: Yes RLE Weight Bearing: Weight bearing as tolerated    Mobility  Bed Mobility Overal bed mobility: Modified Independent Bed Mobility: Supine to Sit;Sit to Supine     Supine to sit: Modified independent (Device/Increase time)        Transfers Overall transfer level: Needs assistance Equipment used: Rolling walker (2 wheeled) Transfers: Sit to/from Stand Sit to Stand: Modified independent (Device/Increase time)            Ambulation/Gait Ambulation/Gait assistance: Modified independent (Device/Increase time) Gait Distance (Feet): 300 Feet Assistive device: Rolling walker (2 wheeled) Gait Pattern/deviations: Step-to pattern;Trunk flexed Gait velocity: decreased   General Gait Details: Pt was able to safely ambulate 300 ft with    Stairs             Wheelchair Mobility    Modified Rankin (Stroke  Patients Only)       Balance Overall balance assessment: History of Falls;Needs assistance Sitting-balance support: Feet supported Sitting balance-Leahy Scale: Normal     Standing balance support: Bilateral upper extremity supported;During functional activity Standing balance-Leahy Scale: Good                              Cognition Arousal/Alertness: Awake/alert Behavior During Therapy: WFL for tasks assessed/performed Overall Cognitive Status: Within Functional Limits for tasks assessed                                 General Comments: Pt is A and O x 4. Agreeable and cooperative throughout.       Exercises      General Comments        Pertinent Vitals/Pain Pain Assessment: No/denies pain Pain Score: 0-No pain Faces Pain Scale: No hurt Pain Location: R knee Pain Descriptors / Indicators: Sore Pain Intervention(s): Limited activity within patient's tolerance;Monitored during session    Home Living                      Prior Function            PT Goals (current goals can now be found in the care plan section) Acute Rehab PT Goals Patient Stated Goal: to get out of the hospital Progress towards PT goals: Progressing toward goals    Frequency    Other (Comment) (discharge from Acute PT)  PT Plan Discharge plan needs to be updated;Other (comment) (Discharge from Acute PT)    Co-evaluation              AM-PAC PT "6 Clicks" Mobility   Outcome Measure  Help needed turning from your back to your side while in a flat bed without using bedrails?: None Help needed moving from lying on your back to sitting on the side of a flat bed without using bedrails?: None Help needed moving to and from a bed to a chair (including a wheelchair)?: None Help needed standing up from a chair using your arms (e.g., wheelchair or bedside chair)?: None Help needed to walk in hospital room?: None Help needed climbing 3-5 steps with a  railing? : None 6 Click Score: 24    End of Session Equipment Utilized During Treatment: Right knee immobilizer Activity Tolerance: Patient tolerated treatment well Patient left: in bed;with call bell/phone within reach;with bed alarm set Nurse Communication: Mobility status PT Visit Diagnosis: Muscle weakness (generalized) (M62.81);Difficulty in walking, not elsewhere classified (R26.2)     Time: 1135-1150 PT Time Calculation (min) (ACUTE ONLY): 15 min  Charges:  $Gait Training: 8-22 mins                     Jetta Lout PTA 05/13/20, 12:26 PM

## 2020-05-13 NOTE — ED Notes (Signed)
Report from Amy RN. Patient sleeping, respirations regular and unlabored. Q15 minute rounds and observation by Rover and Officer to continue.  

## 2020-05-13 NOTE — ED Notes (Signed)
Hourly rounding reveals patient in room. No complaints, stable, in no acute distress. Q15 minute rounds and monitoring via Rover and Officer to continue.   

## 2020-05-13 NOTE — ED Notes (Signed)
VOLUNTARY awaiting placement 

## 2020-05-13 NOTE — ED Provider Notes (Signed)
Emergency Medicine Observation Re-evaluation Note  Luis Cooper is a 79 y.o. male, seen on rounds today.  Pt initially presented to the ED for complaints of Weakness Currently, the patient is stable, no events.  Physical Exam  BP 109/77 (BP Location: Left Arm)   Pulse 81   Temp 98.4 F (36.9 C) (Oral)   Resp 18   Ht 5\' 2"  (1.575 m)   Wt 68 kg   SpO2 96%   BMI 27.44 kg/m  Physical Exam  apparent distress HEENT: moist mucous membranes CV: RRR Pulm: Normal WOB GI: soft and non tender MSK: no edema or cyanosis Neuro: face symmetric, moving all extremities  ED Course / MDM  EKG:EKG Interpretation  Date/Time:  Thursday April 24 2020 16:15:35 EDT Ventricular Rate:  116 PR Interval:  150 QRS Duration: 78 QT Interval:  326 QTC Calculation: 453 R Axis:   20 Text Interpretation: Sinus tachycardia Cannot rule out Anterior infarct , age undetermined Abnormal ECG Confirmed by UNCONFIRMED, DOCTOR (12-07-1972), editor [id number], Tammy [phone]) on 04/25/2020 8:43:27 AM    I have reviewed the labs performed to date as well as medications administered while in observation.  Recent changes in the last 24 hours include none. Plan  Current plan is for sw placement. Patient is not under full IVC at this time.   04/27/2020, MD 05/13/20 [phone]

## 2020-05-14 NOTE — ED Notes (Signed)
Hourly rounding reveals patient in room. No complaints, stable, in no acute distress. Q15 minute rounds and monitoring via Rover and Officer to continue.   

## 2020-05-14 NOTE — ED Notes (Signed)
 of urine emptied from pt urinal. Pt sitting on side of bed eating supper tray.

## 2020-05-14 NOTE — TOC Progression Note (Signed)
Transition of Care [facility]) - Progression Note    Patient Details  Name: Marq Rebello MRN: [id number] Date of Birth: November 19, 1940  [facility]) CM/SW Contact  [place] Cellar, RN Phone Number: 05/14/2020, 10:13 AM  Clinical Narrative:    Submitted new PASRR with corrected gender and addition of Bipolar Disorder. Sent for QHMP review.    Expected Discharge Plan: Skilled Nursing Facility Barriers to Discharge: No Barriers Identified  Expected Discharge Plan and Services Expected Discharge Plan: Skilled Nursing Facility In-house Referral: Clinical Social Work     Living arrangements for the past 2 months: Apartment                                       Social Determinants of Health (SDOH) Interventions    Readmission Risk Interventions No flowsheet data found.

## 2020-05-14 NOTE — ED Provider Notes (Signed)
Emergency Medicine Observation Re-evaluation Note  Luis Cooper is a 79 y.o. male, seen on rounds today.  Pt initially presented to the ED for complaints of Weakness Currently, the patient is resting.  Physical Exam  BP 114/80 (BP Location: Left Arm)   Pulse 88   Temp 98.1 F (36.7 C) (Oral)   Resp 16   Ht 1.575 m (5\' 2" )   Wt 68 kg   SpO2 98%   BMI 27.44 kg/m  Physical Exam  Gen:  No acute distress Resp:  Breathing easily and comfortably, no accessory muscle usage Neuro:  Moving all four extremities, no gross focal neuro deficits Psych:  Resting currently, calm and cooperative when awake  ED Course / MDM  EKG:EKG Interpretation  Date/Time:  Thursday April 24 2020 16:15:35 EDT Ventricular Rate:  116 PR Interval:  150 QRS Duration: 78 QT Interval:  326 QTC Calculation: 453 R Axis:   20 Text Interpretation: Sinus tachycardia Cannot rule out Anterior infarct , age undetermined Abnormal ECG Confirmed by UNCONFIRMED, DOCTOR (12-07-1972), editor [id number], Tammy [phone]) on 04/25/2020 8:43:27 AM    I have reviewed the labs performed to date as well as medications administered while in observation.  No significant changes over the last 24 hours. Plan  Current plan is for social work placement. Patient is not under full IVC at this time.   04/27/2020, MD 05/14/20 [phone]

## 2020-05-14 NOTE — TOC Progression Note (Signed)
Transition of Care [facility]) - Progression Note    Patient Details  Name: Luis Cooper MRN: [id number] Date of Birth: 1941-04-05  Transition of Care [facility]) CM/SW Contact  [place] Cellar, RN Phone Number: 05/14/2020, 9:25 AM  Clinical Narrative:    Sherron Monday to [name], brother, confirmed he has appointment with social security today to discuss anticipated timeline and is planning to involve RNCM and patient during call.   RN CM spoke to [employer] about possible LOG to cover patient while waiting for Medicare to show effective however [place] is unable to accept LOG at this time and patient requires 3 day waiver which prevents alternate facilities.    Expected Discharge Plan: Skilled Nursing Facility Barriers to Discharge: No Barriers Identified  Expected Discharge Plan and Services Expected Discharge Plan: Skilled Nursing Facility In-house Referral: Clinical Social Work     Living arrangements for the past 2 months: Apartment                                       Social Determinants of Health (SDOH) Interventions    Readmission Risk Interventions No flowsheet data found.

## 2020-05-14 NOTE — ED Notes (Signed)

## 2020-05-14 NOTE — ED Notes (Signed)
Pt denies SI/HI/AVH on assessment 

## 2020-05-14 NOTE — TOC Progression Note (Addendum)
Transition of Care [facility]) - Progression Note    Patient Details  Name: Luis Cooper MRN: [id number] Date of Birth: 08-28-1941  Transition of Care [name]) CM/SW Contact  [place] Cellar, RN Phone Number: 05/14/2020, 12:24 PM  Clinical Narrative:    Spoke with patient at bedside. Patient states he is doing well and moving around better with his walker. Patient states he does not think he is going to need much rehab once he gets there. Continues to have immobilizer on right knee.   RN CM spoke with EDP who confirmed patient will follow up with Ortho as outpatient.   PT evaluated patient 8/3 and signed off with recommendation that patient return home with 24/supervision.   Spoke to [place], brother, who advised he had been notified that landlady had decided to rent the room out to someone else as she was worried about patient safety and being able to navigate stairs. [name] is working to contact their sister who patient lived with in the past and see if she might be an option for patient to discharge to. Discussed in detail options including homeless shelters, group homes and ALF. Discussed need for patient to have assistance with getting groceries and transportation to and from appointments. RN CM can outreach for possible meals on wheels once patient has residence.      Expected Discharge Plan: Skilled Nursing Facility Barriers to Discharge: No Barriers Identified  Expected Discharge Plan and Services Expected Discharge Plan: Skilled Nursing Facility In-house Referral: Clinical Social Work     Living arrangements for the past 2 months: Apartment                                       Social Determinants of Health (SDOH) Interventions    Readmission Risk Interventions No flowsheet data found.

## 2020-05-15 NOTE — TOC Progression Note (Signed)
Transition of Care [facility]) - Progression Note    Patient Details  Name: Luis Cooper MRN: [id number] Date of Birth: 08/09/1941  Transition of Care [facility]) CM/SW Contact  [place] Cellar, RN Phone Number: 05/15/2020, 5:02 PM  Clinical Narrative:    Confirmed patient is active with Medicare Part A-Medicare # [id number].    Confirmed with Tresa Endo @ [place] patient was active. Will review for possible placement tomorrow.    Expected Discharge Plan: Skilled Nursing Facility Barriers to Discharge: No Barriers Identified  Expected Discharge Plan and Services Expected Discharge Plan: Skilled Nursing Facility In-house Referral: Clinical Social Work     Living arrangements for the past 2 months: Apartment                                       Social Determinants of Health (SDOH) Interventions    Readmission Risk Interventions No flowsheet data found.

## 2020-05-15 NOTE — TOC Progression Note (Signed)
Transition of Care [facility]) - Progression Note    Patient Details  Name: Halo Laski MRN: [id number] Date of Birth: [birth year]  Transition of Care [facility]) CM/SW Contact  [place] Cellar, RN Phone Number: 05/15/2020, 5:35 PM  Clinical Narrative:    All requested information for PASRR sent via [place] MUST.   Expected Discharge Plan: Skilled Nursing Facility Barriers to Discharge: No Barriers Identified  Expected Discharge Plan and Services Expected Discharge Plan: Skilled Nursing Facility In-house Referral: Clinical Social Work     Living arrangements for the past 2 months: Apartment                                       Social Determinants of Health (SDOH) Interventions    Readmission Risk Interventions No flowsheet data found.

## 2020-05-15 NOTE — ED Provider Notes (Signed)
Emergency Medicine Observation Re-evaluation Note  Luis Cooper is a 79 y.o. male, seen on rounds today.  Pt initially presented to the ED for complaints of Weakness Currently, the patient is standing next to the bed using the bedside commode.  Physical Exam  BP 120/70 (BP Location: Right Arm)   Pulse 80   Temp 98.6 F (37 C) (Oral)   Resp 18   Ht 5\' 2"  (1.575 m)   Wt 68 kg   SpO2 96%   BMI 27.44 kg/m  Physical Exam  Gen:  No acute distress  Resp:  Breathing easily and comfortably, no accessory muscle usage Neuro:   no gross focal neuro deficits.   Psych:  no agitation this am  ED Course / MDM  EKG:EKG Interpretation  Date/Time:  Thursday April 24 2020 16:15:35 EDT Ventricular Rate:  116 PR Interval:  150 QRS Duration: 78 QT Interval:  326 QTC Calculation: 453 R Axis:   20 Text Interpretation: Sinus tachycardia Cannot rule out Anterior infarct , age undetermined Abnormal ECG Confirmed by UNCONFIRMED, DOCTOR (12-07-1972), editor [id number], Tammy [phone]) on 04/25/2020 8:43:27 AM    I have reviewed the labs performed to date as well as medications administered while in observation.  Recent changes in the last 24 hours include none. Plan  Current plan is for sw/psych dispo. Patient is not under full IVC at this time.   04/27/2020, MD 05/15/20 [phone]

## 2020-05-15 NOTE — ED Notes (Signed)
Pt resting at this time. Dinner tray placed by bedside.

## 2020-05-15 NOTE — TOC Progression Note (Signed)
Transition of Care [facility]) - Progression Note    Patient Details  Name: Luis Cooper MRN: [id number] Date of Birth: 1941/05/24  Transition of Care [facility]) CM/SW [place], RN Phone Number: 05/15/2020, 4:46 PM  Clinical Narrative:    Met with patient at bedside for conference call including brother, Elta [place] and [place] attempting to reinstate Medicare benefits. Completed detailed call with Mrs. [place] Crane from [employer]. Mrs. [place] Crane advised patient that his brother could not be speaking on his behalf without a letter of competency showing he was unable to speak for himself. Mrs. [place] Crane also confirmed patient was showing active with Medicare however they were waiting to reinstate his Medicaid. Both patient and his brother were adamant patient has no Medicaid although Mrs. Hall disagreed. Patient is homeless since his rented room was given away and his family is not able to provide him with placement. Discussed possibility that patient would need possible homeless shelter and patient states "he would be fine and we did not need to worry". Brother states he is going to outreach to [employer] and try to get assistance with liquidating patients personal property in the form of land. Patient became angry during call and told brother to not pay anything to his landlord since she was being distrustful to him. RN CM discussed limited options if patient chooses to not try and get his rented room back.    Expected Discharge Plan: [place] Barriers to Discharge: No Barriers Identified  Expected Discharge Plan and Services Expected Discharge Plan: [place] In-house Referral: Clinical Social Work     Living arrangements for the past 2 months: Apartment                                       Social Determinants of Health (SDOH) Interventions    Readmission Risk Interventions No flowsheet data found.

## 2020-05-16 LAB — GLUCOSE, CAPILLARY: Glucose-Capillary: 94 mg/dL (ref 70–99)

## 2020-05-16 NOTE — ED Notes (Signed)
Hourly rounding reveals patient awake in room. No complaints, stable, in no acute distress. Q15 minute rounds and monitoring via Rover and Officer to continue.  

## 2020-05-16 NOTE — ED Notes (Signed)
Hourly rounding reveals patient sleeping in room. No complaints, stable, in no acute distress. Q15 minute rounds and monitoring via Rover and Officer to continue.  

## 2020-05-16 NOTE — ED Notes (Signed)
Hourly rounding reveals patient asleep in room. No complaints, stable, in no acute distress. Q15 minute rounds and monitoring via Rover and Officer to continue.  

## 2020-05-16 NOTE — ED Notes (Signed)
Pt breakfast tray set at bedside. 

## 2020-05-16 NOTE — ED Provider Notes (Signed)
Emergency Medicine Observation Re-evaluation Note  Luis Cooper is a 79 y.o. male, seen on rounds today.  Pt initially presented to the ED for complaints of Weakness Currently, the patient is awaiting placement  Physical Exam  BP 105/65   Pulse 86   Temp 98.1 F (36.7 C) (Oral)   Resp 16   Ht 5\' 2"  (1.575 m)   Wt 68 kg   SpO2 96%   BMI 27.44 kg/m  Physical Exam  Constitutional awake alert in no distress Respiratory: Breathing easily in no respiratory distress Psych: Not agitated at this time  ED Course / MDM  EKG:EKG Interpretation  Date/Time:  Thursday April 24 2020 16:15:35 EDT Ventricular Rate:  116 PR Interval:  150 QRS Duration: 78 QT Interval:  326 QTC Calculation: 453 R Axis:   20 Text Interpretation: Sinus tachycardia Cannot rule out Anterior infarct , age undetermined Abnormal ECG Confirmed by UNCONFIRMED, DOCTOR (12-07-1972), editor [id number], Tammy [phone]) on 04/25/2020 8:43:27 AM    I have reviewed the labs performed to date as well as medications administered while in observation.  Plan  Patient is currently awaiting placement    04/27/2020, MD 05/16/20 [phone]

## 2020-05-16 NOTE — ED Notes (Signed)
Hourly rounding reveals patient sleeping in room. No complaints, stable, in no acute distress. Q15 minute rounds and monitoring to continue. 

## 2020-05-16 NOTE — TOC Progression Note (Addendum)
Transition of Care [facility]) - Progression Note    Patient Details  Name: Luis Cooper MRN: [id number] Date of Birth: 1941/08/31  Transition of Care [facility]) CM/SW Contact  [place] Cellar, RN Phone Number: 05/16/2020, 9:26 AM  Clinical Narrative:    Spoke to brother, Luis Cooper and confirmed patient has no home available at this time. Landlord was not willing to hold patients room and has rented it out. Brother states he is still hopeful for rehab placement until he can assist patient with stable housing and/or medicaid LTC placement. Brother and patient are hopeful to liquidate property and arrange for assisted living facility.   Sent updated notes to [facility] for review. PASRR still pending psych review.   Expected Discharge Plan: Skilled Nursing Facility Barriers to Discharge: No Barriers Identified  Expected Discharge Plan and Services Expected Discharge Plan: Skilled Nursing Facility In-house Referral: Clinical Social Work     Living arrangements for the past 2 months: Apartment                                       Social Determinants of Health (SDOH) Interventions    Readmission Risk Interventions No flowsheet data found.

## 2020-05-16 NOTE — ED Notes (Signed)
Report to include Situation, Background, Assessment, and Recommendations received from Jadeka RN. Patient alert, warm and dry, in no acute distress. Patient denies SI, HI, AVH and pain. Patient made aware of Q15 minute rounds and Rover and Officer presence for their safety. Patient instructed to come to me with needs or concerns.  

## 2020-05-16 NOTE — ED Notes (Signed)
Pt given lunch meal tray. 

## 2020-05-16 NOTE — ED Notes (Signed)
Hourly rounding reveals patient sleeping in room. No complaints, stable, in no acute distress. Q15 minute rounds and monitoring via Security to continue. 

## 2020-05-17 LAB — GLUCOSE, CAPILLARY: Glucose-Capillary: 99 mg/dL (ref 70–99)

## 2020-05-17 NOTE — ED Notes (Signed)
Pt. Got 2 drinks and didn't want no snack

## 2020-05-17 NOTE — ED Notes (Signed)
Hourly rounding reveals patient sleeping in room. No complaints, stable, in no acute distress. Q15 minute rounds and monitoring via Rover and Officer to continue.  

## 2020-05-17 NOTE — ED Provider Notes (Signed)
Emergency Medicine Observation Re-evaluation Note  Luis Cooper is a 79 y.o. male, seen on rounds today.  Pt initially presented to the ED for complaints of Weakness Currently, the patient is resting comfortably and denies any acute concerns concerns or needs at this time.[name]  Physical Exam  BP 134/66 (BP Location: Right Arm)   Pulse 80   Temp 98.5 F (36.9 C) (Oral)   Resp 17   Ht 5\' 2"  (1.575 m)   Wt 68 kg   SpO2 96%   BMI 27.44 kg/m  Physical Exam Vitals and nursing note reviewed.  HENT:     Head: Normocephalic and atraumatic.     Right Ear: External ear normal.     Left Ear: External ear normal.     Nose: Nose normal.  Cardiovascular:     Rate and Rhythm: Normal rate.  Pulmonary:     Effort: No respiratory distress.     ED Course / MDM  EKG:EKG Interpretation  Date/Time:  Thursday April 24 2020 16:15:35 EDT Ventricular Rate:  116 PR Interval:  150 QRS Duration: 78 QT Interval:  326 QTC Calculation: 453 R Axis:   20 Text Interpretation: Sinus tachycardia Cannot rule out Anterior infarct , age undetermined Abnormal ECG Confirmed by UNCONFIRMED, DOCTOR (12-07-1972), editor [id number], Tammy [phone]) on 04/25/2020 8:43:27 AM    I have reviewed the labs performed to date as well as medications administered while in observation.  Recent changes in the last 24 hours include none. Plan  Current plan is for awaiting placement. Patient is not under full IVC at this time.   04/27/2020, MD 05/17/20 1030

## 2020-05-17 NOTE — ED Notes (Signed)
Hourly rounding reveals patient awake in room. No complaints, stable, in no acute distress. Q15 minute rounds and monitoring via Rover and Officer to continue.  

## 2020-05-17 NOTE — ED Notes (Signed)
Pt given lunch tray.

## 2020-05-17 NOTE — ED Notes (Signed)
Water given  

## 2020-05-17 NOTE — ED Notes (Signed)
Pt given breakfast tray

## 2020-05-18 NOTE — ED Notes (Signed)
Hourly rounding reveals patient sleeping in room. No complaints, stable, in no acute distress. Q15 minute rounds and monitoring via Rover and Officer to continue.  

## 2020-05-18 NOTE — ED Notes (Signed)
Report to include situation, background, assessment and recommendations from Amy B. RN. Patient sleeping, respirations regular and unlabored. Q15 minute rounds and security Officer observation to continue.   

## 2020-05-18 NOTE — ED Provider Notes (Signed)
Emergency Medicine Observation Re-evaluation Note  Jule Schlabach is a 79 y.o. male, seen on rounds today.  Pt initially presented to the ED for complaints of Weakness Currently, the patient is calm, in no distress.  Physical Exam  BP 117/71 (BP Location: Left Arm)   Pulse 81   Temp 97.8 F (36.6 C) (Oral)   Resp 20   Ht 5\' 2"  (1.575 m)   Wt 68 kg   SpO2 98%   BMI 27.44 kg/m  Physical Exam   GEN: No acute distress, resting comfortably CV: Skin warm, well perfused Lungs: Normal work of breathing Neuro: No focal deficits  ED Course / MDM  EKG:EKG Interpretation  Date/Time:  Thursday April 24 2020 16:15:35 EDT Ventricular Rate:  116 PR Interval:  150 QRS Duration: 78 QT Interval:  326 QTC Calculation: 453 R Axis:   20 Text Interpretation: Sinus tachycardia Cannot rule out Anterior infarct , age undetermined Abnormal ECG Confirmed by UNCONFIRMED, DOCTOR (12-07-1972), editor [id number], Tammy [phone]) on 04/25/2020 8:43:27 AM    I have reviewed the labs performed to date as well as medications administered while in observation.  Recent changes in the last 24 hours include none. Plan  Current plan is for social work placement/psych placement. Patient is not under full IVC at this time.   04/27/2020, MD 05/18/20 [phone]

## 2020-05-19 LAB — GLUCOSE, CAPILLARY: Glucose-Capillary: 176 mg/dL — ABNORMAL HIGH (ref 70–99)

## 2020-05-19 NOTE — ED Notes (Signed)
Resumed care from ally rn.  Pt sleeping  Brace on right leg.

## 2020-05-19 NOTE — ED Notes (Signed)
Hourly rounding reveals patient sleeping in room. No complaints, stable, in no acute distress. Q15 minute rounds and monitoring via Rover and Officer to continue.  

## 2020-05-19 NOTE — ED Notes (Signed)

## 2020-05-19 NOTE — ED Notes (Signed)
Ate 100% of breakfast, clean and dry.

## 2020-05-19 NOTE — ED Notes (Signed)
Report off to kim rn  

## 2020-05-19 NOTE — ED Notes (Signed)
Hourly rounding reveals patient awake in room. No complaints, stable, in no acute distress. Q15 minute rounds and monitoring via Rover and Officer to continue.  

## 2020-05-19 NOTE — ED Provider Notes (Signed)
Emergency Medicine Observation Re-evaluation Note  Luis Cooper is a 79 y.o. male, seen on rounds today.  Pt initially presented to the ED for complaints of Weakness   Physical Exam  BP 133/66 (BP Location: Right Arm)   Pulse 73   Temp 98.1 F (36.7 C) (Oral)   Resp 18   Ht 5\' 2"  (1.575 m)   Wt 68 kg   SpO2 98%   BMI 27.44 kg/m  Physical Exam  Constitutional: Patient is awake resting comfortably in bed Respiratory: Patient is in no respiratory distress Psych: Patient is not agitated  ED Course / MDM  EKG:EKG Interpretation  Date/Time:  Thursday April 24 2020 16:15:35 EDT Ventricular Rate:  116 PR Interval:  150 QRS Duration: 78 QT Interval:  326 QTC Calculation: 453 R Axis:   20 Text Interpretation: Sinus tachycardia Cannot rule out Anterior infarct , age undetermined Abnormal ECG Confirmed by UNCONFIRMED, DOCTOR (12-07-1971), editor [id number], Tammy [phone]) on 04/25/2020 8:43:27 AM    I have reviewed the labs performed in the last week Plan  Patient is still awaiting placement   04/27/2020, MD 05/19/20 [phone]

## 2020-05-19 NOTE — ED Notes (Signed)
Pt sleeping. 

## 2020-05-20 LAB — GLUCOSE, CAPILLARY
Glucose-Capillary: 102 mg/dL — ABNORMAL HIGH (ref 70–99)
Glucose-Capillary: 103 mg/dL — ABNORMAL HIGH (ref 70–99)

## 2020-05-20 NOTE — ED Notes (Signed)
Pt asleep, breakfast tray placed in rm.  

## 2020-05-20 NOTE — ED Notes (Signed)
Pt eating breakfast 

## 2020-05-20 NOTE — ED Notes (Addendum)
Pt alert and laying calmly in bed. Notified dinner trays will be up soon. Pt denies any needs currently. Denies intentions or thoughts to harm self or others currently.

## 2020-05-20 NOTE — ED Notes (Signed)
Food tray was given with juice. 

## 2020-05-20 NOTE — ED Provider Notes (Signed)
Emergency Medicine Observation Re-evaluation Note  Luis Cooper is a 79 y.o. male, seen on rounds today.  Pt initially presented to the ED for complaints of Weakness Currently, the patient is resting.  Physical Exam  BP 128/69   Pulse 85   Temp 97.8 F (36.6 C) (Oral)   Resp 17   Ht 5\' 2"  (1.575 m)   Wt 68 kg   SpO2 97%   BMI 27.44 kg/m  Physical Exam  Constitutional: Patient is awake resting comfortably in bed Respiratory: Patient is in no respiratory distress Psych: Patient is not agitated  ED Course / MDM  EKG:EKG Interpretation  Date/Time:  Thursday April 24 2020 16:15:35 EDT Ventricular Rate:  116 PR Interval:  150 QRS Duration: 78 QT Interval:  326 QTC Calculation: 453 R Axis:   20 Text Interpretation: Sinus tachycardia Cannot rule out Anterior infarct , age undetermined Abnormal ECG Confirmed by UNCONFIRMED, DOCTOR (12-07-1971), editor [id number], Tammy [phone]) on 04/25/2020 8:43:27 AM    I have reviewed the labs performed to date as well as medications administered while in observation.  Recent changes in the last 24 hours include none. Plan  Current plan is for social work.    04/27/2020, MD 05/20/20 [phone]

## 2020-05-20 NOTE — TOC Progression Note (Addendum)
Transition of Care [facility]) - Progression Note    Patient Details  Name: Asiel Chrostowski MRN: [id number] Date of Birth: [birth year]  Transition of Care [facility]) CM/SW Contact  Edison Cellar, RN Phone Number: 05/20/2020, 12:30 PM  Clinical Narrative:    Sherron Monday to [name], brother, who confirmed he is working on long term plans for patient but patient is not able to come live with him. Brother is interested in some local hotels that offer discounted weekly rates.   RN CM provided list of motels to brother @ [email or web address]  Anticipate discharge to motel tomorrow.    Expected Discharge Plan: Skilled Nursing Facility Barriers to Discharge: No Barriers Identified  Expected Discharge Plan and Services Expected Discharge Plan: Skilled Nursing Facility In-house Referral: Clinical Social Work     Living arrangements for the past 2 months: Apartment                                       Social Determinants of Health (SDOH) Interventions    Readmission Risk Interventions No flowsheet data found.

## 2020-05-20 NOTE — ED Notes (Signed)
Patient refused lunch , will offer again some snacks or beverage. Patient is lethargic and wants to sleep.

## 2020-05-20 NOTE — TOC Progression Note (Signed)
Transition of Care [facility]) - Progression Note    Patient Details  Name: [name] MRN: [id number] Date of Birth: 1941/06/21  Transition of Care [facility]) CM/SW Contact  Marina Goodell Phone Number: [phone] 05/20/2020, 11:34 AM  Clinical Narrative:     CSW spoke with patient about disposition.  CSW explained no patient he is no longer SNF eligible, Pt re-assessed him and they recommended home health.  The patient no longer has a room available at the boarding house where he came from, and therefore will need to go to a homeless shelter, since there is no familial support available for him to stay with.   CSW, RN/CM will contact his brother [name] in [place], to find out if patient is able to stay with him, if not patient will have to be placed in a homeless shelter.  Patient does not qualify for Medicaid but is unable to afford a private pay placement.  Expected Discharge Plan: Skilled Nursing Facility Barriers to Discharge: No Barriers Identified  Expected Discharge Plan and Services Expected Discharge Plan: Skilled Nursing Facility In-house Referral: Clinical Social Work     Living arrangements for the past 2 months: Apartment                                       Social Determinants of Health (SDOH) Interventions    Readmission Risk Interventions No flowsheet data found.

## 2020-05-20 NOTE — TOC Progression Note (Signed)
Transition of Care [facility]) - Progression Note    Patient Details  Name: Luis Cooper MRN: [id number] Date of Birth: 1941-08-06  Transition of Care [facility]) CM/SW Contact  [place] Cellar, RN Phone Number: 05/20/2020, 9:40 AM  Clinical Narrative:    LVMM for brother, Luis Cooper, requesting update on potential family placement as patient is no longer rehab appropriate per PT and recommendation has been made home with home health. Brother had confirmed previously patient no longer has a home. RN CM discussed potential discharge to homeless shelter as patient is moving around freely in room with walker and no longer has any medical needs. Will meet with patient as well for potential placement ideas as patient is alert and oriented x4.    Expected Discharge Plan: Skilled Nursing Facility Barriers to Discharge: No Barriers Identified  Expected Discharge Plan and Services Expected Discharge Plan: Skilled Nursing Facility In-house Referral: Clinical Social Work     Living arrangements for the past 2 months: Apartment                                       Social Determinants of Health (SDOH) Interventions    Readmission Risk Interventions No flowsheet data found.

## 2020-05-20 NOTE — ED Notes (Signed)
900cc of urine emptied from pt's urinal. Urinal attached to bedrail again. Pt asleep with blanket over him. Snoring. Chest rise and fall visible.

## 2020-05-20 NOTE — ED Notes (Signed)
Melanie NT completing BG check.

## 2020-05-21 NOTE — ED Notes (Signed)
Pt given meal tray.

## 2020-05-21 NOTE — ED Provider Notes (Signed)
Emergency Medicine Observation Re-evaluation Note  Luis Cooper is a 79 y.o. male, seen on rounds today.  Pt initially presented to the ED for complaints of Weakness Currently, the patient is calm and cooperative with no complaints this morning.  Physical Exam  BP (!) 144/87 (BP Location: Right Arm)   Pulse 73   Temp 97.9 F (36.6 C) (Oral)   Resp 18   Ht 5\' 2"  (1.575 m)   Wt 68 kg   SpO2 100%   BMI 27.44 kg/m  Physical Exam   General: No apparent distress HEENT: moist mucous membranes CV: RRR Pulm: Normal WOB GI: soft and non tender MSK: no edema or cyanosis Neuro: face symmetric, moving all extremities    ED Course / MDM  I have reviewed the labs performed to date as well as medications administered while in observation. No changes overnight or new labs this morning Plan  Plan for social work placement.   , Don Perking, MD 05/21/20 [phone]

## 2020-05-21 NOTE — ED Notes (Signed)
Pt is in bed and appears to be sleeping. NAD noted

## 2020-05-21 NOTE — TOC Progression Note (Signed)
Transition of Care [facility]) - Progression Note    Patient Details  Name: Luis Cooper MRN: [id number] Date of Birth: 02-04-1941  Transition of Care [facility]) CM/SW Contact  [place] Cellar, RN Phone Number: 05/21/2020, 9:28 AM  Clinical Narrative:    Spoke to Brother, [name] who confirmed family is going to pay for motel for patient to live and will offer assistance until patient is able to get his check back in the bank. RN CM will attempt to set up home health with advanced home health for continued PT and meals on wheels for additional support. Brother is requesting to have another conversation with the patient today prior to discharge to ensure he is willing to follow the plan and work with the family.    Expected Discharge Plan: Skilled Nursing Facility Barriers to Discharge: No Barriers Identified  Expected Discharge Plan and Services Expected Discharge Plan: Skilled Nursing Facility In-house Referral: Clinical Social Work     Living arrangements for the past 2 months: Apartment                                       Social Determinants of Health (SDOH) Interventions    Readmission Risk Interventions No flowsheet data found.

## 2020-05-21 NOTE — ED Notes (Signed)
VOLUNTARY/continues to await placement 

## 2020-05-22 LAB — GLUCOSE, CAPILLARY: Glucose-Capillary: 102 mg/dL — ABNORMAL HIGH (ref 70–99)

## 2020-05-22 NOTE — ED Notes (Signed)
Bedside commode and urinal emptied by this RN. Pt has no other needs at this time.

## 2020-05-22 NOTE — ED Provider Notes (Signed)
Emergency Medicine Observation Re-evaluation Note  Luis Cooper is a 79 y.o. male, seen on rounds today.  Pt initially presented to the ED for complaints of Weakness Currently, the patient is calm and cooperative.  Physical Exam  BP 113/70 (BP Location: Left Arm)   Pulse 79   Temp 97.7 F (36.5 C) (Oral)   Resp 17   Ht 5\' 2"  (1.575 m)   Wt 68 kg   SpO2 100%   BMI 27.44 kg/m  Physical Exam  ED Course / MDM  EKG:EKG Interpretation  Date/Time:  Thursday April 24 2020 16:15:35 EDT Ventricular Rate:  116 PR Interval:  150 QRS Duration: 78 QT Interval:  326 QTC Calculation: 453 R Axis:   20 Text Interpretation: Sinus tachycardia Cannot rule out Anterior infarct , age undetermined Abnormal ECG Confirmed by UNCONFIRMED, DOCTOR (12-07-1971), editor [id number], Tammy [phone]) on 04/25/2020 8:43:27 AM    I have reviewed the labs performed to date as well as medications administered while in observation.  Recent changes in the last 24 hours include none. Plan  Current plan is for DC today.    04/27/2020, MD 05/22/20 1329

## 2020-05-22 NOTE — TOC Progression Note (Signed)
Transition of Care [facility]) - Progression Note    Patient Details  Name: Luis Cooper MRN: [id number] Date of Birth: Nov 18, 1940  Transition of Care [facility]) CM/SW Contact  [place] Cellar, RN Phone Number: 05/22/2020, 8:42 AM  Clinical Narrative:    Confirmed with [name], brother, patient will discharge to [facility] on [address] today at [employer]. RN CM will arrange transportation.    Expected Discharge Plan: Skilled Nursing Facility Barriers to Discharge: No Barriers Identified  Expected Discharge Plan and Services Expected Discharge Plan: Skilled Nursing Facility In-house Referral: Clinical Social Work     Living arrangements for the past 2 months: Apartment                                       Social Determinants of Health (SDOH) Interventions    Readmission Risk Interventions No flowsheet data found.

## 2020-05-22 NOTE — ED Notes (Signed)
Pt was given a tray with juice.

## 2020-06-05 NOTE — Congregational Nurse Program (Signed)
Mr. Minahan was into the clinic today to receive a meal. I brought him in to me after he told me he had been to the ER. In [place] Saturday.  He is ambulating slowly with a cane, he doe not know where the walker he had was. His gait is slightly unsteady d/t his painful right knee and right groin area. Slight swelling noted to his medial knee. Vital signs were checked and his blood sugar d/t diagnosis. His B/S was 281. He said he does not have any of his medications. He said the scipt was sent to the [facility]. I called the pharmacy and they had no meds for him. Will f/u with case manager at the hospital.

## 2020-06-12 ENCOUNTER — Telehealth: Payer: Self-pay | Admitting: Pharmacist

## 2020-06-12 NOTE — Telephone Encounter (Signed)
Patient showing Medicare and prescription coverage May 26, 2020. No additional medication assistance will be provided by [facility]. Patient notified by letter. [facility] [employer] Administrative Assistant [facility]

## 2020-08-05 ENCOUNTER — Emergency Department
Admission: EM | Admit: 2020-08-05 | Discharge: 2020-08-05 | Disposition: A | Discharge status: Home / Self Care | Payer: Medicare Other | Attending: Emergency Medicine | Admitting: Emergency Medicine

## 2020-08-05 ENCOUNTER — Other Ambulatory Visit: Payer: Self-pay

## 2020-08-05 ENCOUNTER — Emergency Department: Payer: Medicare Other

## 2020-08-05 DIAGNOSIS — E119 Type 2 diabetes mellitus without complications: Secondary | ICD-10-CM | POA: Diagnosis not present

## 2020-08-05 DIAGNOSIS — Z7984 Long term (current) use of oral hypoglycemic drugs: Secondary | ICD-10-CM | POA: Insufficient documentation

## 2020-08-05 DIAGNOSIS — Z79899 Other long term (current) drug therapy: Secondary | ICD-10-CM | POA: Diagnosis not present

## 2020-08-05 DIAGNOSIS — F17210 Nicotine dependence, cigarettes, uncomplicated: Secondary | ICD-10-CM | POA: Diagnosis not present

## 2020-08-05 DIAGNOSIS — T63001A Toxic effect of unspecified snake venom, accidental (unintentional), initial encounter: Secondary | ICD-10-CM | POA: Diagnosis not present

## 2020-08-05 DIAGNOSIS — W5911XA Bitten by nonvenomous snake, initial encounter: Secondary | ICD-10-CM

## 2020-08-05 LAB — COMPREHENSIVE METABOLIC PANEL
ALT: 19 U/L (ref 0–44)
AST: 28 U/L (ref 15–41)
Albumin: 3.7 g/dL (ref 3.5–5.0)
Alkaline Phosphatase: 90 U/L (ref 38–126)
Anion gap: 12 (ref 5–15)
BUN: 21 mg/dL (ref 8–23)
CO2: 21 mmol/L — ABNORMAL LOW (ref 22–32)
Calcium: 9.6 mg/dL (ref 8.9–10.3)
Chloride: 105 mmol/L (ref 98–111)
Creatinine, Ser: 1.08 mg/dL (ref 0.61–1.24)
GFR, Estimated: >60 mL/min (ref >60)
Glucose, Bld: 84 mg/dL (ref 70–99)
Potassium: 3.5 mmol/L (ref 3.5–5.1)
Sodium: 138 mmol/L (ref 135–145)
Total Bilirubin: 1.3 mg/dL — ABNORMAL HIGH (ref 0.3–1.2)
Total Protein: 7 g/dL (ref 6.5–8.1)

## 2020-08-05 LAB — CBC
HCT: 34.9 % — ABNORMAL LOW (ref 39.0–52.0)
Hemoglobin: 11.7 g/dL — ABNORMAL LOW (ref 13.0–17.0)
MCH: 30.4 pg (ref 26.0–34.0)
MCHC: 33.5 g/dL (ref 30.0–36.0)
MCV: 90.6 fL (ref 80.0–100.0)
Platelets: 238 10*3/uL (ref 150–400)
RBC: 3.85 MIL/uL — ABNORMAL LOW (ref 4.22–5.81)
RDW: 13.4 % (ref 11.5–15.5)
WBC: 5.6 10*3/uL (ref 4.0–10.5)
nRBC: 0 % (ref 0.0–0.2)

## 2020-08-05 LAB — FIBRINOGEN: Fibrinogen: 695 mg/dL — ABNORMAL HIGH (ref 210–475)

## 2020-08-05 LAB — PROTIME-INR
INR: 1.1 (ref 0.8–1.2)
Prothrombin Time: 14.2 seconds (ref 11.4–15.2)

## 2020-08-05 LAB — APTT: aPTT: 37 seconds — ABNORMAL HIGH (ref 24–36)

## 2020-08-05 MED ORDER — DOXYCYCLINE HYCLATE 100 MG PO CAPS: 100.0000 mg | ORAL_CAPSULE | Freq: Two times a day (BID) | ORAL | 0 refills | Status: DC

## 2020-08-05 NOTE — ED Notes (Signed)
AAOx3.  Skin warm and dry.  NAD 

## 2020-08-05 NOTE — ED Provider Notes (Signed)
[facility] Emergency Department Provider Note  ____________________________________________   First MD Initiated Contact with Patient 08/05/20 1313     (approximate)  I have reviewed the triage vital signs and the nursing notes.   HISTORY  Chief Complaint Snake Bite   HPI Luis Cooper is a 79 y.o. male with a past medical history of DM, gout, and bipolar disorder who presents for assessment of a snake bite wound to the back of his right hand that he states occurred yesterday shortly before midnight.  [place] to his right hand.  States he did not see the snake but thinks it was probably a copperhead.  States he has had some worsening pain redness and swelling around the bite site but no other acute symptoms including fevers, chills, nausea, vomiting, diarrhea, dysuria, cough, shortness of breath, lightheadedness, dizziness, other areas of redness or swelling or any other acute sick symptoms.  He denies recent EtOH or illicit drug use.  States his last tetanus was in the 80s but states "you cannot give me that tetanus shot".           Past Medical History:  Diagnosis Date  . Bipolar 1 disorder (HCC)   . Diabetes mellitus without complication (HCC)   . Gout     Patient Active Problem List   Diagnosis Date Noted  . Bipolar 1 disorder (HCC) 11/02/2018  . Adjustment disorder 11/02/2018  . Bipolar affective disorder, current episode manic (HCC) 09/15/2018  . Diabetes mellitus without complication (HCC) 09/15/2018  . Gout 09/15/2018    No past surgical history on file.  Prior to Admission medications   Medication Sig Start Date End Date Taking? Authorizing Provider  amLODipine (NORVASC) 10 MG tablet Take 1 tablet (10 mg total) by mouth daily. 04/23/20   Emily Filbert, MD  divalproex (DEPAKOTE) 500 MG DR tablet Take 1 tablet (500 mg total) by mouth in the morning and at bedtime. 04/23/20  04/23/21  Emily Filbert, MD  doxycycline (VIBRAMYCIN) 100 MG capsule Take 1 capsule (100 mg total) by mouth 2 (two) times daily for 7 days. 08/05/20 08/12/20  Gilles Chiquito, MD  ibuprofen (ADVIL) 600 MG tablet Take 1 tablet (600 mg total) by mouth in the morning and at bedtime. 04/23/20   Emily Filbert, MD  metFORMIN (GLUCOPHAGE) 500 MG tablet Take 2 tablets (1,000 mg total) by mouth 2 (two) times daily with a meal. 04/23/20   Emily Filbert, MD  OLANZapine (ZYPREXA) 5 MG tablet Take 1 tablet (5 mg total) by mouth at bedtime. 04/23/20   Emily Filbert, MD  polyethylene glycol (MIRALAX / GLYCOLAX) 17 g packet Take 17 g by mouth daily. 04/23/20   Emily Filbert, MD    Allergies Patient has no known allergies.  No family history on file.  Social History Social History   Tobacco Use  . Smoking status: Current Every Day Smoker    Packs/day: 0.50    Types: Cigarettes  . Smokeless tobacco: Never Used  Vaping Use  . Vaping Use: Never used  Substance Use Topics  . Alcohol use: Yes  . Drug use: Never    Review of Systems  Review of Systems  Constitutional: Negative for chills and fever.  HENT: Negative for sore throat.   Eyes: Negative for pain.  Respiratory: Negative for cough and stridor.   Cardiovascular: Negative for chest pain.  Gastrointestinal: Negative for vomiting.  Genitourinary: Negative for dysuria.  Musculoskeletal: Positive for myalgias ( back of R hand).  Skin: Negative for rash.  Neurological: Negative for seizures, loss of consciousness and headaches.  Psychiatric/Behavioral: Negative for suicidal ideas.  All other systems reviewed and are negative.     ____________________________________________   PHYSICAL EXAM:  VITAL SIGNS: ED Triage Vitals  Enc Vitals Group     BP 08/05/20 1258 125/65     Pulse Rate 08/05/20 1258 95     Resp 08/05/20 1259 16     Temp 08/05/20 1258 98.5 F (36.9 C)     Temp Source 08/05/20 1258 Oral      SpO2 08/05/20 1258 97 %     Weight 08/05/20 1259 130 lb (59 kg)     Height 08/05/20 1259 5\' 2"  (1.575 m)     Head Circumference --      Peak Flow --      Pain Score 08/05/20 1258 0     Pain Loc --      Pain Edu? --      Excl. in GC? --    Vitals:   08/05/20 1259 08/05/20 1509  BP:  127/67  Pulse:  88  Resp: 16 18  Temp:  98.6 F (37 C)  SpO2:  97%   Physical Exam Vitals and nursing note reviewed.  Constitutional:      Appearance: He is well-developed.  HENT:     Head: Normocephalic and atraumatic.     Right Ear: External ear normal.     Left Ear: External ear normal.     Nose: Nose normal.     Mouth/Throat:     Mouth: Mucous membranes are moist.  Eyes:     Conjunctiva/sclera: Conjunctivae normal.  Cardiovascular:     Rate and Rhythm: Normal rate and regular rhythm.     Heart sounds: No murmur heard.   Pulmonary:     Effort: Pulmonary effort is normal. No respiratory distress.     Breath sounds: Normal breath sounds.  Abdominal:     Palpations: Abdomen is soft.     Tenderness: There is no abdominal tenderness.  Musculoskeletal:     Cervical back: Neck supple.  Skin:    General: Skin is warm and dry.     Capillary Refill: Capillary refill takes less than 2 seconds.  Neurological:     Mental Status: He is alert and oriented to person, place, and time.  Psychiatric:        Mood and Affect: Mood normal.     Patient has a small puncture wound on the dorsum of his right hand.  He is able to flex and extend all digits against resistance.  Sensation is intact in the distribution of the radial ulnar and median nerves.  There are no other puncture wounds or evidence of trauma to the digits, palmar aspect of the hand, wrist, or forearm.  Patient has full range of motion of his wrist and elbow.  There is some mild tenderness and erythema around the puncture wounds extending approximately 1 cm in circular diameter.  No fluctuance or streaking or other overlying skin  changes. ____________________________________________   LABS (all labs ordered are listed, but only abnormal results are displayed)  Labs Reviewed  COMPREHENSIVE METABOLIC PANEL - Abnormal; Notable for the following components:      Result Value   CO2 21 (*)    Total Bilirubin 1.3 (*)    All other components within normal limits  CBC - Abnormal;  Notable for the following components:   RBC 3.85 (*)    Hemoglobin 11.7 (*)    HCT 34.9 (*)    All other components within normal limits  APTT - Abnormal; Notable for the following components:   aPTT 37 (*)    All other components within normal limits  FIBRINOGEN - Abnormal; Notable for the following components:   Fibrinogen 695 (*)    All other components within normal limits  PROTIME-INR   ____________________________________________   ____________________________________________  RADIOLOGY  ED MD interpretation: No fracture dislocation or retained radiopaque foreign body.  Official radiology report(s): DG Hand 2 View Right  Result Date: 08/05/2020 CLINICAL DATA:  Snake bite EXAM: RIGHT HAND - 2 VIEW COMPARISON:  None. FINDINGS: There is no evidence of fracture or dislocation. Minimal degenerative changes, most pronounced at the first MCP joint. No bony erosion or periostitis. Mild soft tissue swelling over the dorsum of the hand. No soft tissue gas or radiopaque soft tissue foreign body. IMPRESSION: Mild soft tissue swelling over the dorsum of the hand. No acute osseous abnormality. Electronically Signed   By: Duanne Guess D.O.   On: 08/05/2020 13:56    ____________________________________________   PROCEDURES  Procedure(s) performed (including Critical Care):  Procedures   ____________________________________________   INITIAL IMPRESSION / ASSESSMENT AND PLAN / ED COURSE        Patient presents to the ED with concerns for some pain and redness and swelling around the back of his right hand after patient  states he was bit by a snake that he suspects was a copperhead yesterday around 11:45 PM.  On arrival patient is afebrile hemodynamically stable.  Exam as above remarkable for puncture wound to the dorsum of the right hand with some surrounding erythema but no other skin changes.  Patient is otherwise neurovascularly intact in his right hand.  No retained foreign body or underlying fracture on x-ray.  Patient refused tetanus update.  Patient also refused any significant washout of the wounds or his hands.  He refused any analgesia.  CMP shows no significant ultralight or metabolic derangements.  CBC shows mild anemia with hemoglobin of 11.7 but unremarkable platelets and WBC count.  PT is within normal limits.  PTT is unremarkable. Unremarkable. Fibrinogen is not low.  Overall impression is likely uncomplicated stay continuation. Patient has no evidence of systemic symptoms, hypotension, or altered mental status. He is not coagulopathic and has no significant metabolic derangements. There is no evidence of retained foreign body and he is neurovascularly intact in the digit. This occurred greater than 6 hours ago patient does not appear to have any swelling redness spreading over his wrist. Patient refused tetanus or any additional washout of the wound. Patient also refused any analgesia. I advised patient to keep the extremity elevated and follow-up with PCP in 2 to 3 days to have the wound rechecked. Given wound appears fairly dirty and patient is refusing any cleaning at this time Rx written for doxycycline. Given patient refusing any other interventions at this time with otherwise stable vital signs and work-up I believe he is safe for discharge with continued outpatient follow-up. Discharged stable condition. Strict return precautions provided and discussed.  ____________________________________________   FINAL CLINICAL IMPRESSION(S) / ED DIAGNOSES  Final diagnoses:  Snake bite, initial encounter     Medications - No data to display   ED Discharge Orders         Ordered    doxycycline (VIBRAMYCIN) 100 MG capsule  2 times  daily        08/05/20 1451           Note:  This document was prepared using Dragon voice recognition software and may include unintentional dictation errors.   Gilles Chiquito, MD 08/05/20 [phone]

## 2020-08-05 NOTE — ED Triage Notes (Signed)
Patient to ER via [facility] for c/o snake bite to left hand. Per EMS, swelling present up to left forearm. Patient was ST @ 102 with LBBB. Patient is homeless.

## 2020-08-05 NOTE — ED Notes (Signed)
Pt reports at approx 2345 last night he was bit on the back of his right hand by a snake. Pt states he thinks "it was a copperhead," but does not know what the snake looked like. Reports it was small, unsure of color or any other details. Small puncture visible on back of right hand, with possible second puncture adjacent to it.   Minor swelling to back of hand noted, with no spread past wrist. No swelling noted to fingers. Pt reports swelling began at aprox 0900 this morning. Pt denies pain   Pt is mostly cooperative, somewhat irritable. Pt is short with staff, complaining frequently about last time he was here and miscellaneous other things. Pt has been intermittently rude to other RN's, generally cooperative with this RN. Pt observed pulling penis out of pants to scratch himself, other RN requested he cover with his blanket, which pt responded poorly to.  Pt assisted to bathroom by other RN

## 2020-08-05 NOTE — ED Triage Notes (Signed)
To via [facility] from [employer]. Pt is homeless. Pt stating snake bite occurred at midnight while he was walking. Pt with small puncture wound to left hand. Pt's left hand swollen but does not extend to forearm. Pt in NAD. Pt A&Ox4.

## 2020-08-08 ENCOUNTER — Emergency Department
Admission: EM | Admit: 2020-08-08 | Discharge: 2020-08-11 | Disposition: A | Discharge status: Home / Self Care | Payer: Medicare Other | Attending: Emergency Medicine | Admitting: Emergency Medicine

## 2020-08-08 ENCOUNTER — Other Ambulatory Visit: Payer: Self-pay

## 2020-08-08 DIAGNOSIS — Z7984 Long term (current) use of oral hypoglycemic drugs: Secondary | ICD-10-CM | POA: Insufficient documentation

## 2020-08-08 DIAGNOSIS — E119 Type 2 diabetes mellitus without complications: Secondary | ICD-10-CM | POA: Diagnosis not present

## 2020-08-08 DIAGNOSIS — Z9114 Patient's other noncompliance with medication regimen: Secondary | ICD-10-CM

## 2020-08-08 DIAGNOSIS — F319 Bipolar disorder, unspecified: Secondary | ICD-10-CM | POA: Diagnosis present

## 2020-08-08 DIAGNOSIS — Z79899 Other long term (current) drug therapy: Secondary | ICD-10-CM | POA: Diagnosis not present

## 2020-08-08 DIAGNOSIS — I10 Essential (primary) hypertension: Secondary | ICD-10-CM | POA: Diagnosis present

## 2020-08-08 DIAGNOSIS — F3110 Bipolar disorder, current episode manic without psychotic features, unspecified: Secondary | ICD-10-CM | POA: Diagnosis present

## 2020-08-08 DIAGNOSIS — F17210 Nicotine dependence, cigarettes, uncomplicated: Secondary | ICD-10-CM | POA: Insufficient documentation

## 2020-08-08 DIAGNOSIS — Z20822 Contact with and (suspected) exposure to covid-19: Secondary | ICD-10-CM | POA: Insufficient documentation

## 2020-08-08 DIAGNOSIS — R4689 Other symptoms and signs involving appearance and behavior: Secondary | ICD-10-CM

## 2020-08-08 DIAGNOSIS — F3161 Bipolar disorder, current episode mixed, mild: Secondary | ICD-10-CM | POA: Diagnosis not present

## 2020-08-08 DIAGNOSIS — F312 Bipolar disorder, current episode manic severe with psychotic features: Secondary | ICD-10-CM | POA: Insufficient documentation

## 2020-08-08 LAB — URINE DRUG SCREEN, QUALITATIVE (ARMC ONLY)
Amphetamines, Ur Screen: NOT DETECTED
Barbiturates, Ur Screen: NOT DETECTED
Benzodiazepine, Ur Scrn: NOT DETECTED
Cannabinoid 50 Ng, Ur ~~LOC~~: NOT DETECTED
Cocaine Metabolite,Ur ~~LOC~~: NOT DETECTED
MDMA (Ecstasy)Ur Screen: NOT DETECTED
Methadone Scn, Ur: NOT DETECTED
Opiate, Ur Screen: NOT DETECTED
Phencyclidine (PCP) Ur S: NOT DETECTED
Tricyclic, Ur Screen: NOT DETECTED

## 2020-08-08 LAB — RESPIRATORY PANEL BY RT PCR (FLU A&B, COVID)
Influenza A by PCR: NEGATIVE
Influenza B by PCR: NEGATIVE
SARS Coronavirus 2 by RT PCR: NEGATIVE

## 2020-08-08 LAB — CBC
HCT: 36.4 % — ABNORMAL LOW (ref 39.0–52.0)
Hemoglobin: 12.4 g/dL — ABNORMAL LOW (ref 13.0–17.0)
MCH: 30.7 pg (ref 26.0–34.0)
MCHC: 34.1 g/dL (ref 30.0–36.0)
MCV: 90.1 fL (ref 80.0–100.0)
Platelets: 225 10*3/uL (ref 150–400)
RBC: 4.04 MIL/uL — ABNORMAL LOW (ref 4.22–5.81)
RDW: 13.4 % (ref 11.5–15.5)
WBC: 6.4 10*3/uL (ref 4.0–10.5)
nRBC: 0 % (ref 0.0–0.2)

## 2020-08-08 LAB — COMPREHENSIVE METABOLIC PANEL
ALT: 20 U/L (ref 0–44)
AST: 30 U/L (ref 15–41)
Albumin: 3.6 g/dL (ref 3.5–5.0)
Alkaline Phosphatase: 93 U/L (ref 38–126)
Anion gap: 10 (ref 5–15)
BUN: 18 mg/dL (ref 8–23)
CO2: 23 mmol/L (ref 22–32)
Calcium: 9.7 mg/dL (ref 8.9–10.3)
Chloride: 104 mmol/L (ref 98–111)
Creatinine, Ser: 1.07 mg/dL (ref 0.61–1.24)
GFR, Estimated: >60 mL/min (ref >60)
Glucose, Bld: 99 mg/dL (ref 70–99)
Potassium: 3.5 mmol/L (ref 3.5–5.1)
Sodium: 137 mmol/L (ref 135–145)
Total Bilirubin: 0.8 mg/dL (ref 0.3–1.2)
Total Protein: 7.2 g/dL (ref 6.5–8.1)

## 2020-08-08 LAB — URINALYSIS, COMPLETE (UACMP) WITH MICROSCOPIC
Bacteria, UA: NONE SEEN
Bilirubin Urine: NEGATIVE
Glucose, UA: NEGATIVE mg/dL
Hgb urine dipstick: NEGATIVE
Ketones, ur: NEGATIVE mg/dL
Leukocytes,Ua: NEGATIVE
Nitrite: NEGATIVE
Protein, ur: NEGATIVE mg/dL
Specific Gravity, Urine: 1.003 — ABNORMAL LOW (ref 1.005–1.030)
Squamous Epithelial / HPF: NONE SEEN (ref 0–5)
WBC, UA: NONE SEEN WBC/hpf (ref 0–5)
pH: 7 (ref 5.0–8.0)

## 2020-08-08 LAB — ACETAMINOPHEN LEVEL: Acetaminophen (Tylenol), Serum: <10 ug/mL — ABNORMAL LOW (ref 10–30)

## 2020-08-08 LAB — ETHANOL: Alcohol, Ethyl (B): <10 mg/dL (ref <10)

## 2020-08-08 LAB — SALICYLATE LEVEL: Salicylate Lvl: <7 mg/dL — ABNORMAL LOW (ref 7.0–30.0)

## 2020-08-08 MED ORDER — OLANZAPINE 5 MG PO TBDP
10.0000 mg | ORAL_TABLET | Freq: Once | ORAL | Status: AC
  Administered 2020-08-08: 10 mg via ORAL
  Filled 2020-08-08: qty 2

## 2020-08-08 MED ORDER — DIVALPROEX SODIUM 250 MG PO DR TAB
250.0000 mg | DELAYED_RELEASE_TABLET | Freq: Two times a day (BID) | ORAL | Status: DC
  Administered 2020-08-09 – 2020-08-10 (×3): 250 mg via ORAL
  Filled 2020-08-08 (×4): qty 1

## 2020-08-08 MED ORDER — AMLODIPINE BESYLATE 5 MG PO TABS
5.0000 mg | ORAL_TABLET | Freq: Every day | ORAL | Status: DC
  Administered 2020-08-08 – 2020-08-11 (×4): 5 mg via ORAL
  Filled 2020-08-08 (×3): qty 1

## 2020-08-08 NOTE — ED Notes (Signed)
Hourly rounding completed at this time, patient currently asleep in hallway bed. No complaints, stable, and in no acute distress. Q15 minute rounds and monitoring via Rover and Officer to continue. 

## 2020-08-08 NOTE — ED Notes (Signed)
Pt dressed out with this RN Product manager and [employer] officer Belongings include: Hat Long sleeve Mining engineer Tennis shoes  Socks Wallet White mask Gray ring  Pt threw coin change all over floor

## 2020-08-08 NOTE — BH Assessment (Signed)
Referral information for Psychiatric Hospitalization faxed to;    [facility] [phone]) [phone]   [facility]arlene Plater [phone]),   [facility] [phone]),    [facility] [phone] or [phone])   [name] [phone] or [phone]),    [facility] [phone]).

## 2020-08-08 NOTE — ED Notes (Signed)
Pt making strangulation motion to neck and pointing to RN, stating "you know what this means", "this is for you"

## 2020-08-08 NOTE — Consult Note (Signed)
[facility] Face-to-Face Psychiatry Consult   Reason for Consult: Consult for 79 year old man with bipolar disorder brought in under IVC for psychotic behavior Referring Physician: Katrinka Blazing Patient Identification: Luis Cooper MRN:  [id number] Principal Diagnosis: <principal problem not specified> Diagnosis:  Active Problems:   Bipolar affective disorder, current episode manic (HCC)   Hypertension   Total Time spent with patient: 1 hour  Subjective:   Luis Cooper is a 79 y.o. male patient admitted with "so what if I made a scene?".  HPI: Patient seen chart reviewed.  79 year old man with a history of bipolar disorder.  Commitment paperwork filed by protective services reporting that the patient has been agitated paranoid psychotic and threatening.  Reports that he was threatening towards social services and towards his brother and that he has been taking through garbage not taking care of himself.  On interview the patient is loud labile agitated difficult to redirect.  Admits that he was cursing at and threatening the protective services worker who he thinks "gets in my business".  He says the only reason anyone was paying attention to him was because he had lost his coat although he thinks that now he knows where it is.  Patient admits that he has been noncompliant with any psychiatric medicine recently.  Says that he drinks a little bit of beer denies any other drug use.  Patient denies suicidal or homicidal thought or intention.  Denies any hallucinations.  Admits that he sleeps very little and stays up at night "working on something" that is secret and he cannot tell me about  Past Psychiatric History: History of bipolar disorder frequent presentations with hypomania or mania.  Has had several prior hospitalizations.  No known suicide attempts.  Risk to Self:   Risk to Others:   Prior Inpatient Therapy:   Prior Outpatient Therapy:    Past Medical History:  Past Medical History:   Diagnosis Date  . Bipolar 1 disorder (HCC)   . Diabetes mellitus without complication (HCC)   . Gout    No past surgical history on file. Family History: No family history on file. Family Psychiatric  History: See previous Social History:  Social History   Substance and Sexual Activity  Alcohol Use Yes     Social History   Substance and Sexual Activity  Drug Use Never    Social History   Socioeconomic History  . Marital status: Married    Spouse name: Not on file  . Number of children: Not on file  . Years of education: Not on file  . Highest education level: Not on file  Occupational History  . Not on file  Tobacco Use  . Smoking status: Current Every Day Smoker    Packs/day: 0.50    Types: Cigarettes  . Smokeless tobacco: Never Used  Vaping Use  . Vaping Use: Never used  Substance and Sexual Activity  . Alcohol use: Yes  . Drug use: Never  . Sexual activity: Not on file  Other Topics Concern  . Not on file  Social History Narrative  . Not on file   Social Determinants of Health   Financial Resource Strain:   . Difficulty of Paying Living Expenses: Not on file  Food Insecurity:   . Worried About Programme researcher, broadcasting/film/video in the Last Year: Not on file  . Ran Out of Food in the Last Year: Not on file  Transportation Needs:   . Lack of Transportation (Medical): Not on file  . Lack of  Transportation (Non-Medical): Not on file  Physical Activity:   . Days of Exercise per Week: Not on file  . Minutes of Exercise per Session: Not on file  Stress:   . Feeling of Stress : Not on file  Social Connections:   . Frequency of Communication with Friends and Family: Not on file  . Frequency of Social Gatherings with Friends and Family: Not on file  . Attends Religious Services: Not on file  . Active Member of Clubs or Organizations: Not on file  . Attends Banker Meetings: Not on file  . Marital Status: Not on file   Additional Social History:     Allergies:  No Known Allergies  Labs:  Results for orders placed or performed during the hospital encounter of 08/08/20 (from the past 48 hour(s))  Comprehensive metabolic panel     Status: None   Collection Time: 08/08/20  3:57 PM  Result Value Ref Range   Sodium 137 135 - 145 mmol/L   Potassium 3.5 3.5 - 5.1 mmol/L   Chloride 104 98 - 111 mmol/L   CO2 23 22 - 32 mmol/L   Glucose, Bld 99 70 - 99 mg/dL    Comment: Glucose reference range applies only to samples taken after fasting for at least 8 hours.   BUN 18 8 - 23 mg/dL   Creatinine, Ser 9.41 0.61 - 1.24 mg/dL   Calcium 9.7 8.9 - 74.0 mg/dL   Total Protein 7.2 6.5 - 8.1 g/dL   Albumin 3.6 3.5 - 5.0 g/dL   AST 30 15 - 41 U/L   ALT 20 0 - 44 U/L   Alkaline Phosphatase 93 38 - 126 U/L   Total Bilirubin 0.8 0.3 - 1.2 mg/dL   GFR, Estimated >81 >44 mL/min    Comment: (NOTE) Calculated using the CKD-EPI Creatinine Equation (2021)    Anion gap 10 5 - 15    Comment: Performed at [facility], [address] [place], [place] [address]  Ethanol     Status: None   Collection Time: 08/08/20  3:57 PM  Result Value Ref Range   Alcohol, Ethyl (B) <10 <10 mg/dL    Comment: (NOTE) Lowest detectable limit for serum alcohol is 10 mg/dL.  For medical purposes only. Performed at [facility], [address] [place], [place] [address]   Salicylate level     Status: Abnormal   Collection Time: 08/08/20  3:57 PM  Result Value Ref Range   Salicylate Lvl <7.0 (L) 7.0 - 30.0 mg/dL    Comment: Performed at [facility], [address] [place], [place] [address]  Acetaminophen level     Status: Abnormal   Collection Time: 08/08/20  3:57 PM  Result Value Ref Range   Acetaminophen (Tylenol), Serum <10 (L) 10 - 30 ug/mL    Comment: (NOTE) Therapeutic concentrations vary significantly. A range of 10-30 ug/mL  may be an effective concentration for many patients. However, some  are best treated at  concentrations outside of this range. Acetaminophen concentrations >150 ug/mL at 4 hours after ingestion  and >50 ug/mL at 12 hours after ingestion are often associated with  toxic reactions.  Performed at [facility], [address] [place], [place] [address]   cbc     Status: Abnormal   Collection Time: 08/08/20  3:57 PM  Result Value Ref Range   WBC 6.4 4.0 - 10.5 K/uL   RBC 4.04 (L) 4.22 - 5.81 MIL/uL  Hemoglobin 12.4 (L) 13.0 - 17.0 g/dL   HCT 38.2 (L) 39 - 52 %   MCV 90.1 80.0 - 100.0 fL   MCH 30.7 26.0 - 34.0 pg   MCHC 34.1 30.0 - 36.0 g/dL   RDW 50.5 39.7 - 67.3 %   Platelets 225 150 - 400 K/uL   nRBC 0.0 0.0 - 0.2 %    Comment: Performed at [facility], [address], [place], [place] [address]    Current Facility-Administered Medications  Medication Dose Route Frequency Provider Last Rate Last Admin  . amLODipine (NORVASC) tablet 5 mg  5 mg Oral Daily Jesstin Studstill T, MD      . divalproex (DEPAKOTE) DR tablet 250 mg  250 mg Oral Q12H Luxe Cuadros, Jackquline Denmark, MD       Current Outpatient Medications  Medication Sig Dispense Refill  . amLODipine (NORVASC) 10 MG tablet Take 1 tablet (10 mg total) by mouth daily. 30 tablet 1  . divalproex (DEPAKOTE) 500 MG DR tablet Take 1 tablet (500 mg total) by mouth in the morning and at bedtime. 60 tablet 11  . doxycycline (VIBRAMYCIN) 100 MG capsule Take 1 capsule (100 mg total) by mouth 2 (two) times daily for 7 days. 14 capsule 0  . ibuprofen (ADVIL) 600 MG tablet Take 1 tablet (600 mg total) by mouth in the morning and at bedtime. 30 tablet 1  . metFORMIN (GLUCOPHAGE) 500 MG tablet Take 2 tablets (1,000 mg total) by mouth 2 (two) times daily with a meal. 30 tablet 1  . OLANZapine (ZYPREXA) 5 MG tablet Take 1 tablet (5 mg total) by mouth at bedtime. 30 tablet 1  . polyethylene glycol (MIRALAX / GLYCOLAX) 17 g packet Take 17 g by mouth daily. 14 each 1    Musculoskeletal: Strength & Muscle Tone: within normal  limits Gait & Station: normal Patient leans: N/A  Psychiatric Specialty Exam: Physical Exam Vitals and nursing note reviewed.  Constitutional:      Appearance: He is well-developed.  HENT:     Head: Normocephalic and atraumatic.  Eyes:     Conjunctiva/sclera: Conjunctivae normal.     Pupils: Pupils are equal, round, and reactive to light.  Cardiovascular:     Heart sounds: Normal heart sounds.  Pulmonary:     Effort: Pulmonary effort is normal.  Abdominal:     Palpations: Abdomen is soft.  Musculoskeletal:        General: Normal range of motion.     Cervical back: Normal range of motion.  Skin:    General: Skin is warm and dry.  Neurological:     General: No focal deficit present.     Mental Status: He is alert.  Psychiatric:        Attention and Perception: He is inattentive.        Mood and Affect: Affect is labile and angry.        Speech: Speech is rapid and pressured.        Behavior: Behavior is agitated and aggressive.        Thought Content: Thought content is delusional. Thought content does not include homicidal or suicidal ideation.        Cognition and Memory: Cognition is impaired.        Judgment: Judgment is impulsive and inappropriate.     Review of Systems  Constitutional: Negative.   HENT: Negative.   Eyes: Negative.   Respiratory: Negative.   Cardiovascular: Negative.   Gastrointestinal: Negative.  Musculoskeletal: Negative.   Skin: Negative.   Neurological: Negative.   Psychiatric/Behavioral: Positive for confusion and sleep disturbance. The patient is nervous/anxious and is hyperactive.     Blood pressure (!) 158/84, pulse (!) 115, temperature 97.7 F (36.5 C), temperature source Oral, resp. rate 20, height 5\' 2"  (1.575 m), weight 58 kg, SpO2 97 %.Body mass index is 23.39 kg/m.  General Appearance: Disheveled  Eye Contact:  Good  Speech:  Pressured  Volume:  Increased  Mood:  Angry, Euphoric and Irritable  Affect:  Congruent and Labile   Thought Process:  Disorganized  Orientation:  Negative  Thought Content:  Illogical, Ideas of Reference:   Paranoia, Paranoid Ideation and Rumination  Suicidal Thoughts:  No  Homicidal Thoughts:  No  Memory:  Immediate;   Fair Recent;   Poor Remote;   Fair  Judgement:  Impaired  Insight:  Shallow  Psychomotor Activity:  Restlessness  Concentration:  Concentration: Fair  Recall:  FiservFair  Fund of Knowledge:  Fair  Language:  Fair  Akathisia:  No  Handed:  Right  AIMS (if indicated):     Assets:  Desire for Improvement Resilience  ADL's:  Impaired  Cognition:  Impaired,  Mild  Sleep:        Treatment Plan Summary: Daily contact with patient to assess and evaluate symptoms and progress in treatment, Medication management and Plan Patient with poor self-care poor judgment history of clear bipolar disorder currently agitated with psychotic features.  Patient meets commitment criteria and would benefit from inpatient hospitalization.  Orders placed to restart blood pressure medicine and restart Depakote.  My suggestion is that he be admitted if not here [facility] or other beds.  Patient is not thrilled by the plan but neither is he particularly argumentative I think he sees that it is inevitable.  Case reviewed with ER physician.  Continue IVC  Disposition: Recommend psychiatric Inpatient admission when medically cleared. Supportive therapy provided about ongoing stressors.  Mordecai RasmussenJohn Tremont Gavitt, MD 08/08/2020 5:41 PM

## 2020-08-08 NOTE — ED Triage Notes (Signed)
Pt to ED with BPD for IVC. DSS took out papers for pt threatening others and being delusional and off medications.  Pt is very rude, screaming and cursing staff.   Pt is refusing to answer questions, resfusing vital signs Being extremely uncooperative

## 2020-08-08 NOTE — ED Notes (Signed)
Patient incontinent of urine. Patient cleaned and bedding changed. New brief placed

## 2020-08-08 NOTE — BH Assessment (Signed)
Writer attempted to assess pt however this patient is unable to participate in the assessment at this time. Writer spoke with pt's nurse requesting notification if the pt wakes up during the shift. Nursing agreed to notify writer.

## 2020-08-08 NOTE — ED Notes (Signed)
Pt received complete bed change and new clothing due to wetting bed and self. Returned to position of comfort at this time

## 2020-08-08 NOTE — ED Provider Notes (Signed)
[facility] Emergency Department Provider Note ____________________________________________   First MD Initiated Contact with Patient 08/08/20 1615     (approximate)  I have reviewed the triage vital signs and the nursing notes.  HISTORY  Chief Complaint IVC   HPI Aaric Dolph is a 79 y.o. malewho presents to the ED for evaluation of aggression and his mental health.  Chart review indicates history of bipolar disorder, diabetes and gout. Prescribed daily olanzapine and Depakote. Patient seen here in the ED 3 days ago for snake bite to his right hand.  Refusing washout and most care.  Was provided a prescription for doxycycline.  Patient presents to the ED under IVC due to homicidal behavior, aggressiveness and threatening behavior.  Patient reports noncompliance with his doxycycline, asking "why would I take that stuff?"  He reports improving symptoms to his right hand and denies any fevers, worsening pain or swelling.  Patient reports a disagreement with a stranger who was concerned stole his jacket and telephone.  He indicates homicidal tendencies towards this person due to the possibility of him taking his jacket.  He goes on to ask me in a tangential fashion "what would have happened if I did not come here on the 13th?"  He repeatedly asks me again what would have happened, because "you should know, you are the doctor."  He denies hallucinations or suicidality.   Indicates that he has not been taking his medications.  He is agreeable to take oral olanzapine.  Past Medical History:  Diagnosis Date  . Bipolar 1 disorder (HCC)   . Diabetes mellitus without complication (HCC)   . Gout     Patient Active Problem List   Diagnosis Date Noted  . Hypertension 08/08/2020  . Bipolar 1 disorder (HCC) 11/02/2018  . Adjustment disorder 11/02/2018  . Bipolar affective disorder, current episode manic (HCC) 09/15/2018  . Diabetes mellitus without  complication (HCC) 09/15/2018  . Gout 09/15/2018    No past surgical history on file.  Prior to Admission medications   Medication Sig Start Date End Date Taking? Authorizing Provider  amLODipine (NORVASC) 10 MG tablet Take 1 tablet (10 mg total) by mouth daily. 04/23/20   Emily Filbert, MD  divalproex (DEPAKOTE) 500 MG DR tablet Take 1 tablet (500 mg total) by mouth in the morning and at bedtime. 04/23/20 04/23/21  Emily Filbert, MD  doxycycline (VIBRAMYCIN) 100 MG capsule Take 1 capsule (100 mg total) by mouth 2 (two) times daily for 7 days. 08/05/20 08/12/20  Gilles Chiquito, MD  ibuprofen (ADVIL) 600 MG tablet Take 1 tablet (600 mg total) by mouth in the morning and at bedtime. 04/23/20   Emily Filbert, MD  metFORMIN (GLUCOPHAGE) 500 MG tablet Take 2 tablets (1,000 mg total) by mouth 2 (two) times daily with a meal. 04/23/20   Emily Filbert, MD  OLANZapine (ZYPREXA) 5 MG tablet Take 1 tablet (5 mg total) by mouth at bedtime. 04/23/20   Emily Filbert, MD  polyethylene glycol (MIRALAX / GLYCOLAX) 17 g packet Take 17 g by mouth daily. 04/23/20   Emily Filbert, MD    Allergies Patient has no known allergies.  No family history on file.  Social History Social History   Tobacco Use  . Smoking status: Current Every Day Smoker    Packs/day: 0.50    Types: Cigarettes  . Smokeless tobacco: Never Used  Vaping Use  . Vaping Use: Never used  Substance Use Topics  .  Alcohol use: Yes  . Drug use: Never    Review of Systems  Constitutional: No fever/chills Eyes: No visual changes. ENT: No sore throat. Cardiovascular: Denies chest pain. Respiratory: Denies shortness of breath. Gastrointestinal: No abdominal pain.  No nausea, no vomiting.  No diarrhea.  No constipation. Genitourinary: Negative for dysuria. Musculoskeletal: Negative for back pain. Skin: Negative for rash. Neurological: Negative for headaches, focal weakness or  numbness. Psychiatric: Positive for homicidality.  Negative for suicidality and hallucinations.  ____________________________________________   PHYSICAL EXAM:  VITAL SIGNS: Vitals:   08/08/20 1606  BP: (!) 158/84  Pulse: (!) 115  Resp: 20  Temp: 97.7 F (36.5 C)  SpO2: 97%      Constitutional: Alert and oriented. Well appearing and in no acute distress. Eyes: Conjunctivae are normal. PERRL. EOMI. Head: Atraumatic. Nose: No congestion/rhinnorhea. Mouth/Throat: Mucous membranes are moist.  Oropharynx non-erythematous. Neck: No stridor. No cervical spine tenderness to palpation. Cardiovascular: Normal rate, regular rhythm. Grossly normal heart sounds.  Good peripheral circulation. Respiratory: Normal respiratory effort.  No retractions. Lungs CTAB. Gastrointestinal: Soft , nondistended, nontender to palpation. No abdominal bruits. No CVA tenderness. Musculoskeletal: No lower extremity tenderness nor edema.  No joint effusions. No signs of acute trauma. Dorsum of his right hand without overlying skin changes.  No erythema, swelling, induration or fluctuance.  Full active and passive ROM to his digits and wrist. Neurologic:  Normal speech and language. No gross focal neurologic deficits are appreciated. No gait instability noted. Skin:  Skin is warm, dry and intact. No rash noted. Psychiatric: Aggressive mood.  Tangential thought processes.  ____________________________________________   LABS (all labs ordered are listed, but only abnormal results are displayed)  Labs Reviewed  SALICYLATE LEVEL - Abnormal; Notable for the following components:      Result Value   Salicylate Lvl <7.0 (*)    All other components within normal limits  ACETAMINOPHEN LEVEL - Abnormal; Notable for the following components:   Acetaminophen (Tylenol), Serum <10 (*)    All other components within normal limits  CBC - Abnormal; Notable for the following components:   RBC 4.04 (*)    Hemoglobin 12.4  (*)    HCT 36.4 (*)    All other components within normal limits  RESPIRATORY PANEL BY RT PCR (FLU A&B, COVID)  COMPREHENSIVE METABOLIC PANEL  ETHANOL  URINE DRUG SCREEN, QUALITATIVE ([facility] ONLY)   ____________________________________________   PROCEDURES and INTERVENTIONS  Procedure(s) performed (including Critical Care):  Procedures  Medications  divalproex (DEPAKOTE) DR tablet 250 mg (has no administration in time range)  amLODipine (NORVASC) tablet 5 mg (has no administration in time range)  OLANZapine zydis (ZYPREXA) disintegrating tablet 10 mg (10 mg Oral Given 08/08/20 1708)    ____________________________________________   MDM / ED COURSE  79 year old bipolar patient presents to the ED off his medications acutely agitated with aggressive and homicidal tendencies, requiring IVC and psychiatric disposition.  Patient presented tachycardic, likely due to his acute agitation.  Exam is reassuring to me, once he is calmed down and roomed, he follows commands in all 4 extremities without distress and has no evidence of acute organic pathology to preclude psychiatric evaluation and placement.  He is cooperative with staff after he calms down and was roomed, agrees to take oral Zyprexa for calming effects.  Will hold in the ED under IVC pending psych placement.  Clinical Course as of Aug 08 1806  Fri Aug 08, 2020  1640 Dr. Toni Amend, psychiatry, is currently assessing the patient   [  DS]  1806 The patient has been placed in psychiatric observation due to the need to provide a safe environment for the patient while obtaining psychiatric consultation and evaluation, as well as ongoing medical and medication management to treat the patient's condition. The patient has been placed under full IVC at this time.     [DS]    Clinical Course User Index [DS] Delton Prairie, MD     ____________________________________________   FINAL CLINICAL IMPRESSION(S) / ED DIAGNOSES  Final  diagnoses:  Aggressive behavior  Noncompliance with medication regimen  Bipolar 1 disorder [facility])     ED Discharge Orders    None       Heily Carlucci   Note:  This document was prepared using Dragon voice recognition software and may include unintentional dictation errors.   Delton Prairie, MD 08/08/20 [phone]

## 2020-08-08 NOTE — ED Notes (Signed)
Patient has 4 belonging bags in lockers

## 2020-08-08 NOTE — ED Notes (Addendum)
Report received from [employer] including Situation, Background, Assessment, and Recommendations. Patient alert, warm and dry, and in no acute distress. Patient denies SI, HI, AVH and pain. Patient made aware of Q15 minute rounds and Psychologist, counselling presence for their safety. Patient instructed to come to this nurse with needs or concerns.

## 2020-08-09 NOTE — ED Notes (Signed)
Hourly rounding completed at this time, patient currently asleep in hallway bed. No complaints, stable, and in no acute distress. Q15 minute rounds and monitoring via Rover and Officer to continue. 

## 2020-08-09 NOTE — ED Notes (Signed)
Pt attempting to move around in bed, this nurse asks pt what he was attempting to do. Pt states he wants to sit on side of bed at this time. Pt asks if it is day time or night time, informed that it is now night. Pt asks nurse to help with his snack at this time that he had by bedside. Pt tells this nurse about how he is getting stronger and can fight anyone he wants. Pt tells story how brother is younger and pt wants to fight him because he is "a Oceanographer" but his brother won't fight because he is scared. Will continue to monitor.

## 2020-08-09 NOTE — ED Notes (Signed)
Pt given wipes to wipe himself down in bathroom. Refuses help of nurses. Refuses peri-care.

## 2020-08-09 NOTE — ED Notes (Signed)
Psych and TTS at bedside. 

## 2020-08-09 NOTE — ED Notes (Signed)
Report received from Amy, RN including  Situation, Background, Assessment, and Recommendations. Patient alert and oriented, warm and dry, in no acute distress. Patient denies SI, HI, AVH and pain. Patient made aware of Q15 minute rounds and security cameras for their safety. Patient instructed to come to this nurse with needs or concerns. 

## 2020-08-09 NOTE — ED Notes (Signed)
Hourly rounding completed at this time, patient currently awake in hallway bed. No complaints, stable, and in no acute distress. Q15 minute rounds and monitoring via Rover and Officer to continue. 

## 2020-08-09 NOTE — ED Provider Notes (Signed)
Emergency Medicine Observation Re-evaluation Note  Luis Cooper is a 79 y.o. male, seen on rounds today.  Pt initially presented to the ED for complaints of IVC Currently, the patient is sleeping.  Physical Exam  BP 111/63   Pulse (!) 101   Temp 98.3 F (36.8 C) (Oral)   Resp 18   Ht 1.575 m (5\' 2" )   Wt 58 kg   SpO2 95%   BMI 23.39 kg/m  Physical Exam Gen:  No acute distress Resp:  Breathing easily and comfortably, no accessory muscle usage Neuro:  Moving all four extremities, no gross focal neuro deficits Psych:  Resting currently, calm and cooperative when awake  ED Course / MDM  EKG:EKG Interpretation  Date/Time:  Friday August 08 2020 18:10:27 EDT Ventricular Rate:  110 PR Interval:  170 QRS Duration: 130 QT Interval:  386 QTC Calculation: 522 R Axis:   -15 Text Interpretation: Sinus tachycardia Left bundle branch block Abnormal ECG Confirmed by UNCONFIRMED, DOCTOR (06-24-1987), editor [id number], Tammy [phone]) on 08/09/2020 7:22:52 AM  Clinical Course as of Aug 10 747  Fri Aug 08, 2020  1640 Dr. Aug 10, 2020, psychiatry, is currently assessing the patient   [DS]  1806 The patient has been placed in psychiatric observation due to the need to provide a safe environment for the patient while obtaining psychiatric consultation and evaluation, as well as ongoing medical and medication management to treat the patient's condition. The patient has been placed under full IVC at this time.     [DS]    Clinical Course User Index [DS] 1807, MD   I have reviewed the labs performed to date as well as medications administered while in observation.  Recent changes in the last 24 hours include evaluation by Dr. Delton Prairie with psychiatry.  Plan  Current plan is for psychiatric admission, either at this hospital or referred to an outside facility. Patient is under full IVC at this time.   Toni Amend, MD 08/09/20 [phone]

## 2020-08-09 NOTE — ED Notes (Signed)
Pt has continuously suddenly and abruptly woken up from his sleep and yelled "hey, hey, hey." This last time it occurred pt looks at security who is sitting at desk beside pt and states, "I'm going to fuck you up." This nurse goes to bedside and pt looks at nurse and states, "pull your mask down big boy, I will put you to sleep. I will put you to sleep. It's time for me to roll." Pt informed that staff does not remove masks, there is no fighting in the ER, and the current time. Pt states, "I don't care, I'm ready to roll, I will fuck you up." Pt does not move in bed just holds up a fist and looks at nurse. This nurse walks away from bedside, as does security and pt closes eyes again and returns to sleep.

## 2020-08-09 NOTE — ED Notes (Addendum)
Pt wants to walk to bathroom. Attempts to walk independently. Pt is a high fall risk. Refuses to wear wristband. Changed into yellow socks. This RN and Amy, RN attempted to help pt and pt begins screaming about "women are inferior" and "don't you dare touch me". Pt walked with both RNs on each hand and sat on toliet.

## 2020-08-09 NOTE — ED Notes (Signed)
Patient is IVC pending placement 

## 2020-08-09 NOTE — BH Assessment (Signed)
Writer unable to complete TTS consult at this time. Patient willing to participate in the interview, but additional information is need from collateral sources. Patient [place] He denies living in a facility. He states, "This is the best he gets." When asked about medications, he says, "Alcohol and start laughing."

## 2020-08-09 NOTE — ED Notes (Signed)
Pt in bed trying to put socks on and take his brief off. Disoriented at baseline. Denies any needs. Refuses pants.

## 2020-08-09 NOTE — ED Notes (Signed)
Pt looks at nurse as this nurse was completing 15 minute rounds and states, "give me my damn cigarettes, I see them over there." Pt was referring to light switch cover. Pt looks at nurse after being told this and states, "I ain't afraid to kick your small ass, you don't scare me. I want my cigarette." Pt again told that there is no smoking in ED. Pt states, "I don't give a damn what you do in here, I am going to smoke when I get mine." "You know what they call me? They call me grasshopper, it's cause I am fast. You know who was the butterfly, he has a sting too." This nurse and security continue to stay at bedside and listen to pt. Pt then suddenly closes eyes and lays head down on bed.

## 2020-08-09 NOTE — ED Provider Notes (Signed)
Emergency Medicine Observation Re-evaluation Note  Luis Cooper is a 79 y.o. male, seen on rounds today.  Pt initially presented to the ED for complaints of IVC Currently, the patient is sleeping.  Physical Exam  BP 120/74 (BP Location: Right Arm)   Pulse 96   Temp (!) 95.6 F (35.3 C) (Oral)   Resp 18   Ht 5\' 2"  (1.575 m)   Wt 58 kg   SpO2 95%   BMI 23.39 kg/m  Physical Exam General: No acute distress Cardiac: Well-perfused extremities Lungs: No respiratory distress Psych: Appropriate mood and affect  ED Course / MDM  EKG:EKG Interpretation  Date/Time:  Friday August 08 2020 18:10:27 EDT Ventricular Rate:  110 PR Interval:  170 QRS Duration: 130 QT Interval:  386 QTC Calculation: 522 R Axis:   -15 Text Interpretation: Sinus tachycardia Left bundle branch block Abnormal ECG Confirmed by UNCONFIRMED, DOCTOR (06-24-1987), editor [id number], Tammy [phone]) on 08/09/2020 7:22:52 AM  Clinical Course as of Aug 09 2345  Fri Aug 08, 2020  1640 Dr. Aug 10, 2020, psychiatry, is currently assessing the patient   [DS]  1806 The patient has been placed in psychiatric observation due to the need to provide a safe environment for the patient while obtaining psychiatric consultation and evaluation, as well as ongoing medical and medication management to treat the patient's condition. The patient has been placed under full IVC at this time.     [DS]    Clinical Course User Index [DS] 1807, MD   I have reviewed the labs performed to date as well as medications administered while in observation.  Recent changes in the last 24 hours include none.  Plan  Current plan is for placement per social work and psychiatry. Patient is under full IVC at this time.   Delton Prairie, MD 08/10/20 [phone]

## 2020-08-09 NOTE — ED Notes (Signed)
Patient given a snack 

## 2020-08-09 NOTE — ED Notes (Signed)
Hourly rounding completed at this time, patient currently asleep in room. No complaints, stable, and in no acute distress. Q15 minute rounds and monitoring via Rover and Officer to continue. 

## 2020-08-10 DIAGNOSIS — F312 Bipolar disorder, current episode manic severe with psychotic features: Secondary | ICD-10-CM | POA: Diagnosis not present

## 2020-08-10 DIAGNOSIS — F3161 Bipolar disorder, current episode mixed, mild: Secondary | ICD-10-CM

## 2020-08-10 MED ORDER — POLYETHYLENE GLYCOL 3350 17 G PO PACK
17.0000 g | PACK | Freq: Every day | ORAL | Status: DC
  Administered 2020-08-11: 17 g via ORAL
  Filled 2020-08-10: qty 1

## 2020-08-10 MED ORDER — DOXYCYCLINE HYCLATE 100 MG PO TABS
100.0000 mg | ORAL_TABLET | Freq: Two times a day (BID) | ORAL | Status: DC
  Administered 2020-08-10 – 2020-08-11 (×2): 100 mg via ORAL
  Filled 2020-08-10 (×2): qty 1

## 2020-08-10 MED ORDER — METFORMIN HCL 500 MG PO TABS
1000.0000 mg | ORAL_TABLET | Freq: Two times a day (BID) | ORAL | Status: DC
  Administered 2020-08-10 – 2020-08-11 (×2): 1000 mg via ORAL
  Filled 2020-08-10 (×2): qty 2

## 2020-08-10 MED ORDER — DIVALPROEX SODIUM 500 MG PO DR TAB
500.0000 mg | DELAYED_RELEASE_TABLET | Freq: Two times a day (BID) | ORAL | Status: DC
  Administered 2020-08-10 – 2020-08-11 (×2): 500 mg via ORAL
  Filled 2020-08-10 (×2): qty 1

## 2020-08-10 MED ORDER — OLANZAPINE 5 MG PO TABS
5.0000 mg | ORAL_TABLET | Freq: Every day | ORAL | Status: DC
  Administered 2020-08-10: 5 mg via ORAL
  Filled 2020-08-10: qty 1

## 2020-08-10 MED ORDER — AMLODIPINE BESYLATE 5 MG PO TABS: 10.0000 mg | ORAL_TABLET | Freq: Every day | ORAL | Status: DC

## 2020-08-10 NOTE — ED Notes (Signed)
Patient has been appropriate and cooperative, he has been sleeping off and on. Patient brother [name] called and patient refused to talk with him

## 2020-08-10 NOTE — ED Notes (Signed)
Hourly rounding completed at this time, patient currently awake in hallway bed. No complaints, stable, and in no acute distress. Q15 minute rounds and monitoring via Rover and Officer to continue. 

## 2020-08-10 NOTE — ED Notes (Signed)
Hourly rounding completed at this time, patient currently asleep in asleep. No complaints, stable, and in no acute distress. Q15 minute rounds and monitoring via Psychologist, counselling to continue.

## 2020-08-10 NOTE — ED Notes (Signed)
Hourly rounding completed at this time, patient currently asleep in hallway bed. No complaints, stable, and in no acute distress. Q15 minute rounds and monitoring via Rover and Officer to continue. 

## 2020-08-10 NOTE — ED Notes (Addendum)
Pt given cup of ginger ale per request  Pt asks when he will be able to go home. Pt informed that he is still here under IVC and awaiting placement. Pt then asks if he can go out and smoke. Pt informed that is not currently possible. Nicotine patch offered, pt declined.

## 2020-08-10 NOTE — ED Notes (Signed)
Pt given sprite per request 

## 2020-08-10 NOTE — ED Notes (Signed)
Pt returns to bed with assistance of Nicki [place], Tech from restroom. When pt was finished in restroom, pt sat in wheelchair and was rolled by [place]. Pt was unhappy that he could not turn on the shower water in restroom and raised voice and fist toward [place]. Pt did not attempt to hit staff and when this nurse intervened pt states he was not going to touch her. Pt informed of inappropriate actions and that it is not acceptable in ER. Pt attempts to explain why he "had to" complete what he did. Pt returns to bed by standing from chair and sitting back on bed on own power but has unsteady gate. Pt unhappy and states, "I ain't afraid of nobody, I'll fight whoever needs it." Left sitting on bedside in hallway while he replaces his socks on his feet that he had taken off when sitting on wheelchair and aggressively thrown on bed in an attempt to show his frustration with this nurse and conversation. Security present during entire interaction for safety of pt, staff and other pts in area.

## 2020-08-10 NOTE — ED Notes (Signed)
Pt given water per request

## 2020-08-10 NOTE — ED Notes (Signed)
Hourly rounding completed at this time, patient currently awake in restroom. No complaints, stable, and in no acute distress. Q15 minute rounds and monitoring via Rover and Officer to continue. 

## 2020-08-10 NOTE — Social Work (Signed)
TOC CM/SW consult received for SNF Placement  1300  Family contacs:  [phone], brother needs to be update due to concerns for patient's safety, per chart notes.   [facility] is working with [facility]PS to get him into housing/SNF.

## 2020-08-10 NOTE — Consult Note (Signed)
[facility] Face-to-Face Psychiatry Consult   Reason for Consult:  Hypomania, safety Referring Physician:  EDP Patient Identification: Luis Cooper MRN:  [id number] Principal Diagnosis:  Bipolar affective disorder Diagnosis:  Active Problems:   Bipolar affective disorder (HCC)   Hypertension  Total Time spent with patient: 45 minutes  Subjective:   Luis Cooper is a 79 y.o. male patient admitted with safety concerns.  Patient seen and evaluated person by this provider.  Yesterday his behaviors were beginning to stabilize, remained irritable with staff and demanding at times.  He did remain on his bed in the emergency room in the hallway without attempts to leave.  Today he is calm and cooperative with no behavior issues noted, compliant with medications.  No suicidal/homicidal ideations, hallucinations, or substance use.  He would like to leave however safety is a big concern as he is homeless and in harm's way.  His brother was contacted and  explained the plan was for him to be turned over to the social work services to assist in placing him in a facility like a nursing home or assisted living.  His brother was very concerned for his safety as he is very vulnerable in the community being homeless and taken advantage of by other people.  He is currently working with [facility] through the [facility] to find him a facility.  Does not meet criteria for geropsychiatry but does not have capacity to live independently at this time.  Client is a safety risk as he feels he can live in the woods and take care of himself.  However on the 23rd he was bitten by a snake and is on antibiotics.  He is also incontinent and has some difficulty walking.  Client does not realize the safety risk he is in the community in the homeless capacity.  Minimal self-care and does not appear to have a capacity to know when he needs to shower per his brother.  He is also accosted frequently for money and his brother is very  concerned that he will die if he returns to live on the streets.  Patient does not have capacity at this time and needs to go to a facility for safety and care.  HPI per psychiatrist on 10/29:  Patient seen chart reviewed.  79 year old man with a history of bipolar disorder.  Commitment paperwork filed by protective services reporting that the patient has been agitated paranoid psychotic and threatening.  Reports that he was threatening towards social services and towards his brother and that he has been taking through garbage not taking care of himself.  On interview the patient is loud labile agitated difficult to redirect.  Admits that he was cursing at and threatening the protective services worker who he thinks "gets in my business".  He says the only reason anyone was paying attention to him was because he had lost his coat although he thinks that now he knows where it is.  Patient admits that he has been noncompliant with any psychiatric medicine recently.  Says that he drinks a little bit of beer denies any other drug use.  Patient denies suicidal or homicidal thought or intention.  Denies any hallucinations.  Admits that he sleeps very little and stays up at night "working on something" that is secret and he cannot tell me about  Past Psychiatric History: bipolar d/o  Risk to Self:  r/t to lack of self care ability Risk to Others:  none Prior Inpatient Therapy:  yes Prior Outpatient Therapy:  none currently  Past Medical History:  Past Medical History:  Diagnosis Date  . Bipolar 1 disorder (HCC)   . Diabetes mellitus without complication (HCC)   . Gout    No past surgical history on file. Family History: No family history on file. Family Psychiatric  History: none Social History:  Social History   Substance and Sexual Activity  Alcohol Use Yes     Social History   Substance and Sexual Activity  Drug Use Never    Social History   Socioeconomic History  . Marital status:  Married    Spouse name: Not on file  . Number of children: Not on file  . Years of education: Not on file  . Highest education level: Not on file  Occupational History  . Not on file  Tobacco Use  . Smoking status: Current Every Day Smoker    Packs/day: 0.50    Types: Cigarettes  . Smokeless tobacco: Never Used  Vaping Use  . Vaping Use: Never used  Substance and Sexual Activity  . Alcohol use: Yes  . Drug use: Never  . Sexual activity: Not on file  Other Topics Concern  . Not on file  Social History Narrative  . Not on file   Social Determinants of Health   Financial Resource Strain:   . Difficulty of Paying Living Expenses: Not on file  Food Insecurity:   . Worried About Programme researcher, broadcasting/film/videounning Out of Food in the Last Year: Not on file  . Ran Out of Food in the Last Year: Not on file  Transportation Needs:   . Lack of Transportation (Medical): Not on file  . Lack of Transportation (Non-Medical): Not on file  Physical Activity:   . Days of Exercise per Week: Not on file  . Minutes of Exercise per Session: Not on file  Stress:   . Feeling of Stress : Not on file  Social Connections:   . Frequency of Communication with Friends and Family: Not on file  . Frequency of Social Gatherings with Friends and Family: Not on file  . Attends Religious Services: Not on file  . Active Member of Clubs or Organizations: Not on file  . Attends BankerClub or Organization Meetings: Not on file  . Marital Status: Not on file   Additional Social History:    Allergies:  No Known Allergies  Labs:  Results for orders placed or performed during the hospital encounter of 08/08/20 (from the past 48 hour(s))  Comprehensive metabolic panel     Status: None   Collection Time: 08/08/20  3:57 PM  Result Value Ref Range   Sodium 137 135 - 145 mmol/L   Potassium 3.5 3.5 - 5.1 mmol/L   Chloride 104 98 - 111 mmol/L   CO2 23 22 - 32 mmol/L   Glucose, Bld 99 70 - 99 mg/dL    Comment: Glucose reference range applies  only to samples taken after fasting for at least 8 hours.   BUN 18 8 - 23 mg/dL   Creatinine, Ser 2.441.07 0.61 - 1.24 mg/dL   Calcium 9.7 8.9 - 01.010.3 mg/dL   Total Protein 7.2 6.5 - 8.1 g/dL   Albumin 3.6 3.5 - 5.0 g/dL   AST 30 15 - 41 U/L   ALT 20 0 - 44 U/L   Alkaline Phosphatase 93 38 - 126 U/L   Total Bilirubin 0.8 0.3 - 1.2 mg/dL   GFR, Estimated >27>60 >25>60 mL/min    Comment: (NOTE) Calculated using the CKD-EPI  Creatinine Equation (2021)    Anion gap 10 5 - 15    Comment: Performed at [facility], [address] [place], [place] [address]  Ethanol     Status: None   Collection Time: 08/08/20  3:57 PM  Result Value Ref Range   Alcohol, Ethyl (B) <10 <10 mg/dL    Comment: (NOTE) Lowest detectable limit for serum alcohol is 10 mg/dL.  For medical purposes only. Performed at [facility], [address] [place], [place] [address]   Salicylate level     Status: Abnormal   Collection Time: 08/08/20  3:57 PM  Result Value Ref Range   Salicylate Lvl <7.0 (L) 7.0 - 30.0 mg/dL    Comment: Performed at [facility], [address] [place], [place] [address]  Acetaminophen level     Status: Abnormal   Collection Time: 08/08/20  3:57 PM  Result Value Ref Range   Acetaminophen (Tylenol), Serum <10 (L) 10 - 30 ug/mL    Comment: (NOTE) Therapeutic concentrations vary significantly. A range of 10-30 ug/mL  may be an effective concentration for many patients. However, some  are best treated at concentrations outside of this range. Acetaminophen concentrations >150 ug/mL at 4 hours after ingestion  and >50 ug/mL at 12 hours after ingestion are often associated with  toxic reactions.  Performed at [facility], [address] [place], [place] [address]   cbc     Status: Abnormal   Collection Time: 08/08/20  3:57 PM  Result Value Ref Range   WBC 6.4 4.0 - 10.5 K/uL   RBC 4.04 (L) 4.22 - 5.81 MIL/uL   Hemoglobin 12.4 (L) 13.0 - 17.0 g/dL    HCT 29.5 (L) 39 - 52 %   MCV 90.1 80.0 - 100.0 fL   MCH 30.7 26.0 - 34.0 pg   MCHC 34.1 30.0 - 36.0 g/dL   RDW 62.1 30.8 - 65.7 %   Platelets 225 150 - 400 K/uL   nRBC 0.0 0.0 - 0.2 %    Comment: Performed at [facility], [address], [place], [place] [address]  Urine Drug Screen, Qualitative     Status: None   Collection Time: 08/08/20  5:06 PM  Result Value Ref Range   Tricyclic, Ur Screen NONE DETECTED NONE DETECTED   Amphetamines, Ur Screen NONE DETECTED NONE DETECTED   MDMA (Ecstasy)Ur Screen NONE DETECTED NONE DETECTED   Cocaine Metabolite,Ur [place] NONE DETECTED NONE DETECTED   Opiate, Ur Screen NONE DETECTED NONE DETECTED   Phencyclidine (PCP) Ur S NONE DETECTED NONE DETECTED   Cannabinoid 50 Ng, Ur [place] NONE DETECTED NONE DETECTED   Barbiturates, Ur Screen NONE DETECTED NONE DETECTED   Benzodiazepine, Ur Scrn NONE DETECTED NONE DETECTED   Methadone Scn, Ur NONE DETECTED NONE DETECTED    Comment: (NOTE) Tricyclics + metabolites, urine    Cutoff 1000 ng/mL Amphetamines + metabolites, urine  Cutoff 1000 ng/mL MDMA (Ecstasy), urine              Cutoff 500 ng/mL Cocaine Metabolite, urine          Cutoff 300 ng/mL Opiate + metabolites, urine        Cutoff 300 ng/mL Phencyclidine (PCP), urine         Cutoff 25 ng/mL Cannabinoid, urine                 Cutoff 50 ng/mL Barbiturates + metabolites, urine  Cutoff 200 ng/mL Benzodiazepine, urine  Cutoff 200 ng/mL Methadone, urine                   Cutoff 300 ng/mL  The urine drug screen provides only a preliminary, unconfirmed analytical test result and should not be used for non-medical purposes. Clinical consideration and professional judgment should be applied to any positive drug screen result due to possible interfering substances. A more specific alternate chemical method must be used in order to obtain a confirmed analytical result. Gas chromatography / mass spectrometry (GC/MS) is the preferred confirm  atory method. Performed at [facility], [address] [place], [place] [address]   Respiratory Panel by RT PCR (Flu A&B, Covid) - Nasopharyngeal Swab     Status: None   Collection Time: 08/08/20  6:36 PM   Specimen: Nasopharyngeal Swab  Result Value Ref Range   SARS Coronavirus 2 by RT PCR NEGATIVE NEGATIVE    Comment: (NOTE) SARS-CoV-2 target nucleic acids are NOT DETECTED.  The SARS-CoV-2 RNA is generally detectable in upper respiratoy specimens during the acute phase of infection. The lowest concentration of SARS-CoV-2 viral copies this assay can detect is 131 copies/mL. A negative result does not preclude SARS-Cov-2 infection and should not be used as the sole basis for treatment or other patient management decisions. A negative result may occur with  improper specimen collection/handling, submission of specimen other than nasopharyngeal swab, presence of viral mutation(s) within the areas targeted by this assay, and inadequate number of viral copies (<131 copies/mL). A negative result must be combined with clinical observations, patient history, and epidemiological information. The expected result is Negative.  Fact Sheet for Patients:  https://www.moore.com/  Fact Sheet for Healthcare Providers:  https://www.young.biz/  This test is no t yet approved or cleared by the [place] FDA and  has been authorized for detection and/or diagnosis of SARS-CoV-2 by FDA under an Emergency Use Authorization (EUA). This EUA will remain  in effect (meaning this test can be used) for the duration of the COVID-19 declaration under Section 564(b)(1) of the Act, 21 U.S.C. section 360bbb-3(b)(1), unless the authorization is terminated or revoked sooner.     Influenza A by PCR NEGATIVE NEGATIVE   Influenza B by PCR NEGATIVE NEGATIVE    Comment: (NOTE) The Xpert Xpress SARS-CoV-2/FLU/RSV assay is intended as an aid in  the diagnosis of  influenza from Nasopharyngeal swab specimens and  should not be used as a sole basis for treatment. Nasal washings and  aspirates are unacceptable for Xpert Xpress SARS-CoV-2/FLU/RSV  testing.  Fact Sheet for Patients: https://www.moore.com/  Fact Sheet for Healthcare Providers: https://www.young.biz/  This test is not yet approved or cleared by the [place] FDA and  has been authorized for detection and/or diagnosis of SARS-CoV-2 by  FDA under an Emergency Use Authorization (EUA). This EUA will remain  in effect (meaning this test can be used) for the duration of the  Covid-19 declaration under Section 564(b)(1) of the Act, 21  U.S.C. section 360bbb-3(b)(1), unless the authorization is  terminated or revoked. Performed at [facility], [address] [place], [place] [address]   Urinalysis, Complete w Microscopic     Status: Abnormal   Collection Time: 08/08/20  8:28 PM  Result Value Ref Range   Color, Urine STRAW (A) YELLOW   APPearance CLEAR (A) CLEAR   Specific Gravity, Urine 1.003 (L) 1.005 - 1.030   pH 7.0 5.0 - 8.0   Glucose, UA NEGATIVE NEGATIVE mg/dL   Hgb urine dipstick NEGATIVE  NEGATIVE   Bilirubin Urine NEGATIVE NEGATIVE   Ketones, ur NEGATIVE NEGATIVE mg/dL   Protein, ur NEGATIVE NEGATIVE mg/dL   Nitrite NEGATIVE NEGATIVE   Leukocytes,Ua NEGATIVE NEGATIVE   RBC / HPF 0-5 0 - 5 RBC/hpf   WBC, UA NONE SEEN 0 - 5 WBC/hpf   Bacteria, UA NONE SEEN NONE SEEN   Squamous Epithelial / LPF NONE SEEN 0 - 5    Comment: Performed at [facility], [address], [place], [place] [address]    Current Facility-Administered Medications  Medication Dose Route Frequency Provider Last Rate Last Admin  . amLODipine (NORVASC) tablet 10 mg  10 mg Oral Daily Charm Rings, NP      . amLODipine (NORVASC) tablet 5 mg  5 mg Oral Daily Clapacs, Jackquline Denmark, MD   5 mg at 08/10/20 1100  . divalproex (DEPAKOTE) DR tablet 500 mg   500 mg Oral Q12H Elexis Pollak, Herminio Heads, NP      . doxycycline (VIBRAMYCIN) capsule 100 mg  100 mg Oral BID Charm Rings, NP      . metFORMIN (GLUCOPHAGE) tablet 1,000 mg  1,000 mg Oral BID WC Charm Rings, NP      . OLANZapine (ZYPREXA) tablet 5 mg  5 mg Oral QHS Charm Rings, NP      . polyethylene glycol (MIRALAX / GLYCOLAX) packet 17 g  17 g Oral Daily Charm Rings, NP       Current Outpatient Medications  Medication Sig Dispense Refill  . amLODipine (NORVASC) 10 MG tablet Take 1 tablet (10 mg total) by mouth daily. 30 tablet 1  . divalproex (DEPAKOTE) 500 MG DR tablet Take 1 tablet (500 mg total) by mouth in the morning and at bedtime. 60 tablet 11  . doxycycline (VIBRAMYCIN) 100 MG capsule Take 1 capsule (100 mg total) by mouth 2 (two) times daily for 7 days. 14 capsule 0  . ibuprofen (ADVIL) 600 MG tablet Take 1 tablet (600 mg total) by mouth in the morning and at bedtime. 30 tablet 1  . metFORMIN (GLUCOPHAGE) 500 MG tablet Take 2 tablets (1,000 mg total) by mouth 2 (two) times daily with a meal. 30 tablet 1  . OLANZapine (ZYPREXA) 5 MG tablet Take 1 tablet (5 mg total) by mouth at bedtime. 30 tablet 1  . polyethylene glycol (MIRALAX / GLYCOLAX) 17 g packet Take 17 g by mouth daily. 14 each 1    Musculoskeletal: Strength & Muscle Tone: decreased Gait & Station: unsteady Patient leans: N/A  Psychiatric Specialty Exam: Physical Exam Vitals and nursing note reviewed.  Constitutional:      Appearance: Normal appearance.  HENT:     Head: Normocephalic.     Nose: Nose normal.  Pulmonary:     Effort: Pulmonary effort is normal.  Musculoskeletal:     Cervical back: Normal range of motion.  Neurological:     General: No focal deficit present.     Mental Status: He is alert and oriented to person, place, and time.  Psychiatric:        Attention and Perception: Attention and perception normal.        Mood and Affect: Mood is anxious.        Speech: Speech normal.         Behavior: Behavior normal. Behavior is cooperative.        Thought Content: Thought content normal.        Cognition and Memory: Cognition is impaired. Memory is  impaired.        Judgment: Judgment is impulsive.     Review of Systems  Psychiatric/Behavioral: Positive for confusion. The patient is nervous/anxious.   All other systems reviewed and are negative.   Blood pressure 108/68, pulse 95, temperature 98.2 F (36.8 C), temperature source Oral, resp. rate 18, height  (1.575 m), weight 58 kg, SpO2 97 %.Body mass index is 23.39 kg/m.  General Appearance: Casual  Eye Contact:  Fair  Speech:  Normal Rate  Volume:  Normal  Mood:  Euthymic  Affect:  Congruent  Thought Process:  Coherent and Descriptions of Associations: Intact  Orientation:  Other:  person and place  Thought Content:  Logical  Suicidal Thoughts:  No  Homicidal Thoughts:  No  Memory:  Immediate;   Fair Recent;   Poor Remote;   Fair  Judgement:  Impaired  Insight:  Lacking  Psychomotor Activity:  Decreased  Concentration:  Concentration: Fair and Attention Span: Fair  Recall:  [employer] of Knowledge:  Fair  Language:  Good  Akathisia:  No  Handed:  Right  AIMS (if indicated):     Assets:  Leisure Time Resilience  ADL's:  Impaired  Cognition:  Impaired,  Mild  Sleep:        Treatment Plan Summary: Bipolar affective disorder: -Continue Zyprexa 5 mg at bedtime -Continue Depakote 500 mg BID -Psychiatrically cleared  Social work consult placed as he does not have capacity to take care of himself or the reality of the danger of living in the woods--snake bite on 10/23, incontinent, no self-care  Disposition: No evidence of imminent risk to self or others at present.   Patient does not meet criteria for psychiatric inpatient admission.  Nanine Means, NP 08/10/2020 11:31 AM

## 2020-08-11 MED ORDER — METFORMIN HCL 1000 MG PO TABS
1000.0000 mg | ORAL_TABLET | Freq: Two times a day (BID) | ORAL | 1 refills | Status: DC
Start: 2020-08-11 — End: 2021-02-19

## 2020-08-11 MED ORDER — AMLODIPINE BESYLATE 5 MG PO TABS: 5.0000 mg | ORAL_TABLET | Freq: Every day | ORAL | 1 refills | Status: DC

## 2020-08-11 MED ORDER — OLANZAPINE 5 MG PO TABS: 5.0000 mg | ORAL_TABLET | Freq: Every day | ORAL | 1 refills | Status: DC

## 2020-08-11 MED ORDER — DIVALPROEX SODIUM 500 MG PO DR TAB: 500.0000 mg | DELAYED_RELEASE_TABLET | Freq: Two times a day (BID) | ORAL | 1 refills | Status: DC

## 2020-08-11 NOTE — Consult Note (Signed)
[facility] Face-to-Face Psychiatry Consult   Reason for Consult: Follow-up consult for this 79 year old man with bipolar disorder Referring Physician: Manson Passey Patient Identification: Luis Cooper MRN:  [id number] Principal Diagnosis: Bipolar affective disorder (HCC) Diagnosis:  Principal Problem:   Bipolar affective disorder (HCC) Active Problems:   Hypertension   Total Time spent with patient: 30 minutes  Subjective:   Kevork Joyce is a 79 y.o. male patient admitted with patient seen chart reviewed.  Follow-up for patient with bipolar "I am doing fine"  HPI: Patient seen chart reviewed.  This is a 79 year old man who is been in the emergency room over the weekend.  Patient is much improved compared to Friday.  He has been compliant with medicine.  He states he understands he needs to take his Depakote.  He is currently calm and lucid and able to engage in normal back-and-forth conversation.  Not making any delusional statements.  Denies suicidal or homicidal thoughts.  Patient states he intends to take some of the money he has saved and follow-up staying in a motel for the time being.  Patient understands the utility of medication and the reason for the prescriptions.  Past Psychiatric History: Past history of bipolar disorder recurrent  Risk to Self:   Risk to Others:   Prior Inpatient Therapy:   Prior Outpatient Therapy:    Past Medical History:  Past Medical History:  Diagnosis Date  . Bipolar 1 disorder (HCC)   . Diabetes mellitus without complication (HCC)   . Gout    No past surgical history on file. Family History: No family history on file. Family Psychiatric  History: See previous Social History:  Social History   Substance and Sexual Activity  Alcohol Use Yes     Social History   Substance and Sexual Activity  Drug Use Never    Social History   Socioeconomic History  . Marital status: Married    Spouse name: Not on file  . Number of children: Not on file   . Years of education: Not on file  . Highest education level: Not on file  Occupational History  . Not on file  Tobacco Use  . Smoking status: Current Every Day Smoker    Packs/day: 0.50    Types: Cigarettes  . Smokeless tobacco: Never Used  Vaping Use  . Vaping Use: Never used  Substance and Sexual Activity  . Alcohol use: Yes  . Drug use: Never  . Sexual activity: Not on file  Other Topics Concern  . Not on file  Social History Narrative  . Not on file   Social Determinants of Health   Financial Resource Strain:   . Difficulty of Paying Living Expenses: Not on file  Food Insecurity:   . Worried About Programme researcher, broadcasting/film/video in the Last Year: Not on file  . Ran Out of Food in the Last Year: Not on file  Transportation Needs:   . Lack of Transportation (Medical): Not on file  . Lack of Transportation (Non-Medical): Not on file  Physical Activity:   . Days of Exercise per Week: Not on file  . Minutes of Exercise per Session: Not on file  Stress:   . Feeling of Stress : Not on file  Social Connections:   . Frequency of Communication with Friends and Family: Not on file  . Frequency of Social Gatherings with Friends and Family: Not on file  . Attends Religious Services: Not on file  . Active Member of Clubs or Organizations: Not  on file  . Attends Banker Meetings: Not on file  . Marital Status: Not on file   Additional Social History:    Allergies:  No Known Allergies  Labs: No results found for this or any previous visit (from the past 48 hour(s)).  Current Facility-Administered Medications  Medication Dose Route Frequency Provider Last Rate Last Admin  . amLODipine (NORVASC) tablet 5 mg  5 mg Oral Daily Akirra Lacerda, Jackquline Denmark, MD   5 mg at 08/11/20 0936  . divalproex (DEPAKOTE) DR tablet 500 mg  500 mg Oral Q12H Charm Rings, NP   500 mg at 08/11/20 0936  . doxycycline (VIBRA-TABS) tablet 100 mg  100 mg Oral BID Charm Rings, NP   100 mg at 08/11/20  0936  . metFORMIN (GLUCOPHAGE) tablet 1,000 mg  1,000 mg Oral BID WC Charm Rings, NP   1,000 mg at 08/11/20 0936  . OLANZapine (ZYPREXA) tablet 5 mg  5 mg Oral QHS Charm Rings, NP   5 mg at 08/10/20 2236  . polyethylene glycol (MIRALAX / GLYCOLAX) packet 17 g  17 g Oral Daily Charm Rings, NP   17 g at 08/11/20 6270   Current Outpatient Medications  Medication Sig Dispense Refill  . [START ON 08/12/2020] amLODipine (NORVASC) 5 MG tablet Take 1 tablet (5 mg total) by mouth daily. 30 tablet 1  . divalproex (DEPAKOTE) 500 MG DR tablet Take 1 tablet (500 mg total) by mouth every 12 (twelve) hours. 60 tablet 1  . ibuprofen (ADVIL) 600 MG tablet Take 1 tablet (600 mg total) by mouth in the morning and at bedtime. 30 tablet 1  . metFORMIN (GLUCOPHAGE) 1000 MG tablet Take 1 tablet (1,000 mg total) by mouth 2 (two) times daily with a meal. 60 tablet 1  . OLANZapine (ZYPREXA) 5 MG tablet Take 1 tablet (5 mg total) by mouth at bedtime. 30 tablet 1  . polyethylene glycol (MIRALAX / GLYCOLAX) 17 g packet Take 17 g by mouth daily. 14 each 1    Musculoskeletal: Strength & Muscle Tone: within normal limits Gait & Station: normal Patient leans: N/A  Psychiatric Specialty Exam: Physical Exam Vitals and nursing note reviewed.  Constitutional:      Appearance: He is well-developed.  HENT:     Head: Normocephalic and atraumatic.  Eyes:     Conjunctiva/sclera: Conjunctivae normal.     Pupils: Pupils are equal, round, and reactive to light.  Cardiovascular:     Heart sounds: Normal heart sounds.  Pulmonary:     Effort: Pulmonary effort is normal.  Abdominal:     Palpations: Abdomen is soft.  Musculoskeletal:        General: Normal range of motion.     Cervical back: Normal range of motion.  Skin:    General: Skin is warm and dry.  Neurological:     General: No focal deficit present.     Mental Status: He is alert.  Psychiatric:        Mood and Affect: Mood normal.        Behavior:  Behavior normal.        Thought Content: Thought content normal.     Review of Systems  Constitutional: Negative.   HENT: Negative.   Eyes: Negative.   Respiratory: Negative.   Cardiovascular: Negative.   Gastrointestinal: Negative.   Musculoskeletal: Negative.   Skin: Negative.   Neurological: Negative.   Psychiatric/Behavioral: Negative.     Blood pressure 115/65, pulse 91,  temperature 99.3 F (37.4 C), temperature source Oral, resp. rate 18, height 5\' 2"  (1.575 m), weight 58 kg, SpO2 94 %.Body mass index is 23.39 kg/m.  General Appearance: Casual  Eye Contact:  Fair  Speech:  Clear and Coherent  Volume:  Normal  Mood:  Euthymic  Affect:  Congruent  Thought Process:  Coherent  Orientation:  Full (Time, Place, and Person)  Thought Content:  Logical  Suicidal Thoughts:  No  Homicidal Thoughts:  No  Memory:  Immediate;   Fair Recent;   Fair Remote;   Fair  Judgement:  Fair  Insight:  Fair  Psychomotor Activity:  Normal  Concentration:  Concentration: Fair  Recall:  of Knowledge:  Fair  Language:  Fair  Akathisia:  No  Handed:  Right  AIMS (if indicated):     Assets:  Desire for Improvement  ADL's:  Intact  Cognition:  WNL  Sleep:        Treatment Plan Summary: Plan Patient is legally competent and does not lack capacity to make decisions about self-care.  He is no longer meeting commitment criteria.  Discontinue IVC.  Review medications.  Prescriptions will be printed.  Case reviewed with emergency room doctor and TTS.  Patient can be discharged from the emergency room at the discretion of the ER physician  Disposition: No evidence of imminent risk to self or others at present.   Patient does not meet criteria for psychiatric inpatient admission. Supportive therapy provided about ongoing stressors. Discussed crisis plan, support from social network, calling 911, coming to the Emergency Department, and calling Suicide Hotline.  [employer],  MD 08/11/2020 11:53 AM

## 2020-08-11 NOTE — TOC Progression Note (Signed)
Transition of Care [facility]) - Progression Note    Patient Details  Name: Luis Cooper MRN: [id number] Date of Birth: 1940/11/30  [facility] CM/SW Contact  Shawn Route, RN Phone Number: 08/11/2020, 12:35 PM  Clinical Narrative:     Spoke with patient's who is concerned for brother but unable to care for him, since he still works full time.  He gave me know of social worker Lorrin Goodell  [phone].  I have left message with social worker that patient has been medically cleared and will be discharged from ED.  I also left message with her supervisor [employer].    Brother is concerned about his brother's well being.  I have urged him to call DSS as well.        Expected Discharge Plan and Services                                                 Social Determinants of Health (SDOH) Interventions    Readmission Risk Interventions No flowsheet data found.

## 2020-08-11 NOTE — ED Notes (Signed)
Patient ambulates with walker. Clothing articles returned, patient in bathroom to get dressed. Patient discharged to home.

## 2020-08-11 NOTE — ED Notes (Signed)
awaiing pt consult and disposition plan. Patient evaluated by psych, recommendation discharged to home.

## 2020-08-11 NOTE — ED Notes (Signed)
Report received from Noah RN

## 2020-08-11 NOTE — BH Assessment (Signed)
Per Dr. Toni Amend pt is psych cleared and can be discharged today.

## 2020-08-11 NOTE — ED Notes (Signed)
Pt states he wants to go outside. Pt informed is nearly 6 in the am and not able to take pt outside at this time. Pt verbalizes understanding. Urinal emptied of yellow urine without odor. Pt declines offer for po fluids.

## 2020-08-18 ENCOUNTER — Emergency Department
Admission: EM | Admit: 2020-08-18 | Discharge: 2020-08-20 | Disposition: A | Discharge status: Other Acute Inpatient Hospital | Payer: Medicare Other | Attending: Emergency Medicine | Admitting: Emergency Medicine

## 2020-08-18 ENCOUNTER — Other Ambulatory Visit: Payer: Self-pay

## 2020-08-18 DIAGNOSIS — E119 Type 2 diabetes mellitus without complications: Secondary | ICD-10-CM | POA: Diagnosis not present

## 2020-08-18 DIAGNOSIS — R410 Disorientation, unspecified: Secondary | ICD-10-CM | POA: Diagnosis not present

## 2020-08-18 DIAGNOSIS — Z79899 Other long term (current) drug therapy: Secondary | ICD-10-CM | POA: Insufficient documentation

## 2020-08-18 DIAGNOSIS — Z20822 Contact with and (suspected) exposure to covid-19: Secondary | ICD-10-CM | POA: Insufficient documentation

## 2020-08-18 DIAGNOSIS — F3113 Bipolar disorder, current episode manic without psychotic features, severe: Secondary | ICD-10-CM | POA: Diagnosis not present

## 2020-08-18 DIAGNOSIS — Z7984 Long term (current) use of oral hypoglycemic drugs: Secondary | ICD-10-CM | POA: Insufficient documentation

## 2020-08-18 DIAGNOSIS — F17210 Nicotine dependence, cigarettes, uncomplicated: Secondary | ICD-10-CM | POA: Diagnosis not present

## 2020-08-18 DIAGNOSIS — F3110 Bipolar disorder, current episode manic without psychotic features, unspecified: Secondary | ICD-10-CM | POA: Diagnosis not present

## 2020-08-18 DIAGNOSIS — R456 Violent behavior: Secondary | ICD-10-CM | POA: Diagnosis not present

## 2020-08-18 DIAGNOSIS — I10 Essential (primary) hypertension: Secondary | ICD-10-CM | POA: Diagnosis present

## 2020-08-18 LAB — COMPREHENSIVE METABOLIC PANEL
ALT: 20 U/L (ref 0–44)
AST: 27 U/L (ref 15–41)
Albumin: 3.9 g/dL (ref 3.5–5.0)
Alkaline Phosphatase: 109 U/L (ref 38–126)
Anion gap: 11 (ref 5–15)
BUN: 19 mg/dL (ref 8–23)
CO2: 26 mmol/L (ref 22–32)
Calcium: 9.4 mg/dL (ref 8.9–10.3)
Chloride: 102 mmol/L (ref 98–111)
Creatinine, Ser: 1.23 mg/dL (ref 0.61–1.24)
GFR, Estimated: >60 mL/min (ref >60)
Glucose, Bld: 180 mg/dL — ABNORMAL HIGH (ref 70–99)
Potassium: 3.3 mmol/L — ABNORMAL LOW (ref 3.5–5.1)
Sodium: 139 mmol/L (ref 135–145)
Total Bilirubin: 0.9 mg/dL (ref 0.3–1.2)
Total Protein: 7.6 g/dL (ref 6.5–8.1)

## 2020-08-18 LAB — SALICYLATE LEVEL: Salicylate Lvl: <7 mg/dL — ABNORMAL LOW (ref 7.0–30.0)

## 2020-08-18 LAB — CBC
HCT: 37.3 % — ABNORMAL LOW (ref 39.0–52.0)
Hemoglobin: 12.6 g/dL — ABNORMAL LOW (ref 13.0–17.0)
MCH: 30.7 pg (ref 26.0–34.0)
MCHC: 33.8 g/dL (ref 30.0–36.0)
MCV: 91 fL (ref 80.0–100.0)
Platelets: 215 10*3/uL (ref 150–400)
RBC: 4.1 MIL/uL — ABNORMAL LOW (ref 4.22–5.81)
RDW: 13.5 % (ref 11.5–15.5)
WBC: 5.3 10*3/uL (ref 4.0–10.5)
nRBC: 0 % (ref 0.0–0.2)

## 2020-08-18 LAB — ACETAMINOPHEN LEVEL: Acetaminophen (Tylenol), Serum: <10 ug/mL — ABNORMAL LOW (ref 10–30)

## 2020-08-18 LAB — ETHANOL: Alcohol, Ethyl (B): <10 mg/dL (ref <10)

## 2020-08-18 MED ORDER — METFORMIN HCL 500 MG PO TABS
1000.0000 mg | ORAL_TABLET | Freq: Two times a day (BID) | ORAL | Status: DC
  Administered 2020-08-19: 1000 mg via ORAL
  Filled 2020-08-18 (×2): qty 2

## 2020-08-18 MED ORDER — AMLODIPINE BESYLATE 5 MG PO TABS
5.0000 mg | ORAL_TABLET | Freq: Every day | ORAL | Status: DC
  Administered 2020-08-19 – 2020-08-20 (×2): 5 mg via ORAL
  Filled 2020-08-18 (×2): qty 1

## 2020-08-18 MED ORDER — DIVALPROEX SODIUM 500 MG PO DR TAB
500.0000 mg | DELAYED_RELEASE_TABLET | Freq: Two times a day (BID) | ORAL | Status: DC
  Administered 2020-08-19 – 2020-08-20 (×3): 500 mg via ORAL
  Filled 2020-08-18 (×3): qty 1

## 2020-08-18 NOTE — ED Triage Notes (Signed)
Pt to ED with sherriff for IVC being delusional and aggressive.  Pt aggressive with staff, not answering questions, tried to hit this RN.   Pt refusing to answer all questions. States "You know all these answers"

## 2020-08-18 NOTE — ED Notes (Signed)
Pt started to verbally abuse and cuss out RN. Pt stated "I want my God Damn bananas. If I dont get them tomorrow, I am going to shut this hospital town. I am going to call President Trump."

## 2020-08-18 NOTE — ED Notes (Signed)
Pt called RN over to bedside. Pt started making inappropriate comments like "I want to got to war and I want the prize to be you," "I'll make sure that happens," and "you are built very fine."

## 2020-08-18 NOTE — ED Notes (Signed)
IVC, pend psych consult 

## 2020-08-18 NOTE — ED Notes (Signed)
Pt dressed out with 2 officers, this RN and laura NT Pt throwing shoes Pt belongings include: 2 shoes White socks Jeans Black shirt Brown jacket Cigarettes Lighter x2 Little green notebook Watch Gray ring [employer]   Trash bag full of clothes and trash

## 2020-08-18 NOTE — ED Notes (Signed)
Pt asked for a banana. RN looked and no banana in the ED. Pt stated "I want a fucking banana, go to the fucking store and get me banana." Pt provided applesauce and calmed down.

## 2020-08-18 NOTE — ED Notes (Signed)
Pt refused to allow RN to obtain vital signs at this time.

## 2020-08-18 NOTE — BH Assessment (Signed)
This writer attempted to arouse pt to complete assessment however pt was unable to participate in the interview. This Clinical research associate will attempt to assess pt in the event that he arises.

## 2020-08-18 NOTE — ED Notes (Signed)
Pt agitated and verbally abusive. Pt refused medications. "I aint got no diabetes and my blood pressure is fine."

## 2020-08-18 NOTE — ED Notes (Signed)
Changed patient, and changed bedding. Gave pt food tray with sprite.

## 2020-08-18 NOTE — ED Notes (Signed)
Gave food tray with juice. 

## 2020-08-18 NOTE — ED Notes (Signed)
IVC 

## 2020-08-19 DIAGNOSIS — F3110 Bipolar disorder, current episode manic without psychotic features, unspecified: Secondary | ICD-10-CM

## 2020-08-19 MED ORDER — OLANZAPINE 5 MG PO TABS
5.0000 mg | ORAL_TABLET | Freq: Every day | ORAL | Status: DC
  Administered 2020-08-19: 5 mg via ORAL
  Filled 2020-08-19: qty 1

## 2020-08-19 NOTE — BH Assessment (Addendum)
Per [employer] Psychologist, occupational) and Dr. Neale Burly No appropriate bed is available on [facility] due to Pt's hx of aggression  TTS attempted to make contact with [facility] ( [phone]) at 5:45pm and received no answer. TTS is currently unaware of bed availability at this time.   Referral information for Psychiatric Hospitalization faxed to:   [facility] ([phone]- [phone]),    [address] [phone]),    [name] [phone] -or- [phone]),    [facility] [phone] or [phone])    [facility] [phone] -[phone], [phone])  . [place] [phone] or [phone]),   . [facility] [phone] or [phone])

## 2020-08-19 NOTE — ED Notes (Signed)
Per [facility]CS, pt has been accepted at [facility] and will be transported tomorrow (08-20-20) at approx 0800

## 2020-08-19 NOTE — ED Provider Notes (Signed)
Emergency Medicine Observation Re-evaluation Note  Latoya Diskin is a 79 y.o. male, seen on rounds today.  Pt initially presented to the ED for complaints of ivc Currently, the patient is awake and talking with the security officers in a calm fashion.  Physical Exam  BP 118/81 (BP Location: Left Arm)    Pulse (!) 125    Temp 98.5 F (36.9 C) (Oral)    Resp 18    Ht 1.575 m (5\' 2" )    Wt 58 kg    SpO2 95%    BMI 23.39 kg/m  Physical Exam Gen:  No acute distress Resp:  Breathing easily and comfortably, no accessory muscle usage Neuro:  Moving all four extremities, no gross focal neuro deficits Psych: Currently calm and communicative with the security officers but very labile and frequently verbally aggressive  ED Course / MDM  EKG:    I have reviewed the labs performed to date as well as medications administered while in observation.  Recent changes in the last 24 hours include initial assessment.  Plan  Current plan is for psychiatric disposition. Patient is under full IVC at this time.   , MD 08/19/20 [phone]

## 2020-08-19 NOTE — ED Notes (Signed)
Pt given meal tray.

## 2020-08-19 NOTE — ED Notes (Signed)
Pt refused ER staff to obtain covid-19 swab at this time.

## 2020-08-19 NOTE — ED Notes (Signed)
Pt used restroom and then requested some chocolate milk.

## 2020-08-19 NOTE — ED Notes (Signed)
Pt woken up to give meds. Pt apparently disoriented on waking, begins talking about how to defend oneself from attackers, and declaring himself "the right hand of God, there ain't no Barrett Goldie here." Pt demands that "Amy be kept safe, you all better not let anything happen to her."  Pt returns to sleep at this time

## 2020-08-19 NOTE — ED Notes (Signed)
INVOLUNTARY per Dr Toni Amend doesn't meet criteria for inpatient admit, will await dispo

## 2020-08-19 NOTE — Consult Note (Signed)
[facility] Face-to-Face Psychiatry Consult   Reason for Consult: Consult for 79 year old man with a history of bipolar disorder brought to the hospital because of bizarre behavior Referring Physician: Roxan Hockey Patient Identification: Luis Cooper Principal Diagnosis: Manic bipolar I disorder (HCC) Diagnosis:  Principal Problem:   Manic bipolar I disorder (HCC) Active Problems:   Diabetes mellitus without complication (HCC)   Hypertension   Total Time spent with patient: 1 hour  Subjective:   Luis Cooper is a 79 y.o. male patient admitted with "do not all of yall get in my business!".  HPI: Seen chart reviewed.  Patient known from previous encounters.  79 year old man with bipolar disorder brought to the hospital because of reported bizarre behavior in public.  Details seem a little sketchy but evidently he was making a scene somewhere in public which is pretty typical of him when he is manic.  Patient on interview today was difficult to redirect.  Talking about how he is buying a pickup truck for some woman.  Throws around large numbers of suppose it money that he is spending.  Talks about how great he is doing and how much he gait needs to get out of the hospital.  Patient is disheveled dirty missing some of his clothing.  Hyperverbal.  Admits to me he is not taking medicine and that he has not been sleeping recently.  Denies suicidal or homicidal ideation but is delusional paranoid and unable to form any kind of rational plan for self-care.  Past Psychiatric History: Patient has a history of bipolar disorder and frequently has been brought to the emergency room.  Many times he stabilizes enough that he can be discharged but he also has a history of many prior hospitalizations.  Does reasonably well on Depakote and a little bit of antipsychotic.  Long history of noncompliance.  No known suicidal behavior.  Gets grumpy in the hospital but I do not think that he is ever a major  danger to anyone else  Risk to Self:   Risk to Others:   Prior Inpatient Therapy:   Prior Outpatient Therapy:    Past Medical History:  Past Medical History:  Diagnosis Date  . Bipolar 1 disorder (HCC)   . Diabetes mellitus without complication (HCC)   . Gout    No past surgical history on file. Family History: No family history on file. Family Psychiatric  History: See previous notes.  Nothing reported Social History:  Social History   Substance and Sexual Activity  Alcohol Use Yes     Social History   Substance and Sexual Activity  Drug Use Never    Social History   Socioeconomic History  . Marital status: Single    Spouse name: Not on file  . Number of children: Not on file  . Years of education: Not on file  . Highest education level: Not on file  Occupational History  . Not on file  Tobacco Use  . Smoking status: Current Every Day Smoker    Packs/day: 0.50    Types: Cigarettes  . Smokeless tobacco: Never Used  Vaping Use  . Vaping Use: Never used  Substance and Sexual Activity  . Alcohol use: Yes  . Drug use: Never  . Sexual activity: Not on file  Other Topics Concern  . Not on file  Social History Narrative  . Not on file   Social Determinants of Health   Financial Resource Strain:   . Difficulty of Paying Living Expenses: Not  on file  Food Insecurity:   . Worried About Programme researcher, broadcasting/film/video in the Last Year: Not on file  . Ran Out of Food in the Last Year: Not on file  Transportation Needs:   . Lack of Transportation (Medical): Not on file  . Lack of Transportation (Non-Medical): Not on file  Physical Activity:   . Days of Exercise per Week: Not on file  . Minutes of Exercise per Session: Not on file  Stress:   . Feeling of Stress : Not on file  Social Connections:   . Frequency of Communication with Friends and Family: Not on file  . Frequency of Social Gatherings with Friends and Family: Not on file  . Attends Religious Services: Not on  file  . Active Member of Clubs or Organizations: Not on file  . Attends Banker Meetings: Not on file  . Marital Status: Not on file   Additional Social History:    Allergies:  No Known Allergies  Labs:  Results for orders placed or performed during the hospital encounter of 08/18/20 (from the past 48 hour(s))  Comprehensive metabolic panel     Status: Abnormal   Collection Time: 08/18/20  4:58 PM  Result Value Ref Range   Sodium 139 135 - 145 mmol/L   Potassium 3.3 (L) 3.5 - 5.1 mmol/L   Chloride 102 98 - 111 mmol/L   CO2 26 22 - 32 mmol/L   Glucose, Bld 180 (H) 70 - 99 mg/dL    Comment: Glucose reference range applies only to samples taken after fasting for at least 8 hours.   BUN 19 8 - 23 mg/dL   Creatinine, Ser 0.93 0.61 - 1.24 mg/dL   Calcium 9.4 8.9 - 23.5 mg/dL   Total Protein 7.6 6.5 - 8.1 g/dL   Albumin 3.9 3.5 - 5.0 g/dL   AST 27 15 - 41 U/L   ALT 20 0 - 44 U/L   Alkaline Phosphatase 109 38 - 126 U/L   Total Bilirubin 0.9 0.3 - 1.2 mg/dL   GFR, Estimated >57 >32 mL/min    Comment: (NOTE) Calculated using the CKD-EPI Creatinine Equation (2021)    Anion gap 11 5 - 15    Comment: Performed at [facility], [address] [place], [place] [address]  Ethanol     Status: None   Collection Time: 08/18/20  4:58 PM  Result Value Ref Range   Alcohol, Ethyl (B) <10 <10 mg/dL    Comment: (NOTE) Lowest detectable limit for serum alcohol is 10 mg/dL.  For medical purposes only. Performed at [facility], [address] [place], [place] [address]   Salicylate level     Status: Abnormal   Collection Time: 08/18/20  4:58 PM  Result Value Ref Range   Salicylate Lvl <7.0 (L) 7.0 - 30.0 mg/dL    Comment: Performed at [facility], [address] [place], [place] [address]  Acetaminophen level     Status: Abnormal   Collection Time: 08/18/20  4:58 PM  Result Value Ref Range   Acetaminophen (Tylenol), Serum <10 (L) 10 - 30  ug/mL    Comment: (NOTE) Therapeutic concentrations vary significantly. A range of 10-30 ug/mL  may be an effective concentration for many patients. However, some  are best treated at concentrations outside of this range. Acetaminophen concentrations >150 ug/mL at 4 hours after ingestion  and >50 ug/mL at 12 hours after ingestion are often associated with  toxic reactions.  Performed at [facility], [address] [place], [place] [address]   cbc     Status: Abnormal   Collection Time: 08/18/20  4:58 PM  Result Value Ref Range   WBC 5.3 4.0 - 10.5 K/uL   RBC 4.10 (L) 4.22 - 5.81 MIL/uL   Hemoglobin 12.6 (L) 13.0 - 17.0 g/dL   HCT 78.5 (L) 39 - 52 %   MCV 91.0 80.0 - 100.0 fL   MCH 30.7 26.0 - 34.0 pg   MCHC 33.8 30.0 - 36.0 g/dL   RDW 88.5 02.7 - 74.1 %   Platelets 215 150 - 400 K/uL   nRBC 0.0 0.0 - 0.2 %    Comment: Performed at [facility], [address], [place], [place] [address]    Current Facility-Administered Medications  Medication Dose Route Frequency Provider Last Rate Last Admin  . amLODipine (NORVASC) tablet 5 mg  5 mg Oral Daily Arnaldo Natal, MD   5 mg at 08/19/20 1107  . divalproex (DEPAKOTE) DR tablet 500 mg  500 mg Oral Q12H Arnaldo Natal, MD   500 mg at 08/19/20 1107  . metFORMIN (GLUCOPHAGE) tablet 1,000 mg  1,000 mg Oral BID WC Arnaldo Natal, MD   1,000 mg at 08/19/20 1107  . OLANZapine (ZYPREXA) tablet 5 mg  5 mg Oral QHS Avielle Imbert, Jackquline Denmark, MD       Current Outpatient Medications  Medication Sig Dispense Refill  . amLODipine (NORVASC) 5 MG tablet Take 1 tablet (5 mg total) by mouth daily. 30 tablet 1  . divalproex (DEPAKOTE) 500 MG DR tablet Take 1 tablet (500 mg total) by mouth every 12 (twelve) hours. 60 tablet 1  . metFORMIN (GLUCOPHAGE) 1000 MG tablet Take 1 tablet (1,000 mg total) by mouth 2 (two) times daily with a meal. 60 tablet 1  . OLANZapine (ZYPREXA) 5 MG tablet Take 1 tablet (5 mg total) by mouth at bedtime. 30  tablet 1  . ibuprofen (ADVIL) 600 MG tablet Take 1 tablet (600 mg total) by mouth in the morning and at bedtime. (Patient not taking: Reported on 08/19/2020) 30 tablet 1  . polyethylene glycol (MIRALAX / GLYCOLAX) 17 g packet Take 17 g by mouth daily. (Patient not taking: Reported on 08/19/2020) 14 each 1    Musculoskeletal: Strength & Muscle Tone: within normal limits Gait & Station: normal Patient leans: N/A  Psychiatric Specialty Exam: Physical Exam Vitals and nursing note reviewed.  Constitutional:      Appearance: He is well-developed.  HENT:     Head: Normocephalic and atraumatic.  Eyes:     Conjunctiva/sclera: Conjunctivae normal.     Pupils: Pupils are equal, round, and reactive to light.  Cardiovascular:     Heart sounds: Normal heart sounds.  Pulmonary:     Effort: Pulmonary effort is normal.  Abdominal:     Palpations: Abdomen is soft.  Musculoskeletal:        General: Normal range of motion.     Cervical back: Normal range of motion.  Skin:    General: Skin is warm and dry.  Neurological:     General: No focal deficit present.     Mental Status: He is alert.  Psychiatric:        Attention and Perception: He is inattentive.        Mood and Affect: Affect is labile and angry.        Speech: Speech is rapid and pressured.  Behavior: Behavior is agitated.        Thought Content: Thought content is delusional.        Cognition and Memory: Cognition is impaired. Memory is impaired.        Judgment: Judgment is impulsive and inappropriate.     Review of Systems  Constitutional: Negative.   HENT: Negative.   Eyes: Negative.   Respiratory: Negative.   Cardiovascular: Negative.   Gastrointestinal: Negative.   Musculoskeletal: Negative.   Skin: Negative.   Neurological: Negative.   Psychiatric/Behavioral: Positive for behavioral problems. Negative for self-injury, sleep disturbance and suicidal ideas. The patient is hyperactive.     Blood pressure 94/65,  pulse 91, temperature 97.9 F (36.6 C), temperature source Oral, resp. rate 18, height 5\' 2"  (1.575 m), weight 58 kg, SpO2 98 %.Body mass index is 23.39 kg/m.  General Appearance: Disheveled  Eye Contact:  Fair  Speech:  Pressured  Volume:  Increased  Mood:  Euphoric  Affect:  Inappropriate, Labile and Notably he can go from laughing and shouting to tearfulness instantly and then bounce right back from it.  Thought Process:  Disorganized  Orientation:  Full (Time, Place, and Person)  Thought Content:  Illogical, Delusions and Paranoid Ideation  Suicidal Thoughts:  No  Homicidal Thoughts:  No  Memory:  Immediate;   Fair Recent;   Poor Remote;   Fair  Judgement:  Impaired  Insight:  Shallow  Psychomotor Activity:  Restlessness  Concentration:  Concentration: Poor  Recall:  FiservFair  Fund of Knowledge:  Fair  Language:  Fair  Akathisia:  No  Handed:  Right  AIMS (if indicated):     Assets:  Desire for Improvement Resilience  ADL's:  Impaired  Cognition:  Impaired,  Mild  Sleep:        Treatment Plan Summary: Daily contact with patient to assess and evaluate symptoms and progress in treatment, Medication management and Plan  79 year old man with bipolar disorder who is still showing signs of mania.  Disorganized grandiose hyperverbal paranoid.  Clearly not taking care of his health.  Sugars and blood pressure not well controlled.  Not even able to really answer where he has been staying in a lucid manner.  At this point I think it is best to admit him to the hospital for stabilization.  Reviewed medications.  Restarted amlodipine metformin Depakote and olanzapine.  Case reviewed with TTS and emergency room physician.  Disposition: Patient does not meet criteria for psychiatric inpatient admission. Supportive therapy provided about ongoing stressors.  Mordecai RasmussenJohn Samariah Hokenson, MD 08/19/2020 12:12 PM

## 2020-08-19 NOTE — BH Assessment (Signed)
Pt is currently under review with [facility] BMU 
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
## 2020-08-19 NOTE — BH Assessment (Signed)
Late entry: This writer attempted to assess pt however pt was unable to participate in the interview. When asked if he wanted to hurt himself pt responded, "I want to hurt you. You know what I mean sweetheart" and proceeded to laugh incessantly.

## 2020-08-19 NOTE — BH Assessment (Signed)
PATIENT BED AVAILABLE AFTER 8AM on 08/20/20  Patient has been accepted to [facility].  Patient assigned to [facility] Accepting physician is Dr. Estill Cotta.  Call report to [phone].  Representative was Aqua.   ER Staff is aware of it:  [facility] ER Secretary  Dr. Larinda Buttery, ER MD  [facility] Patient's Nurse     Address: [address] [place], [place] [address]
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
## 2020-08-19 NOTE — ED Notes (Signed)
Cup of OJ given per request

## 2020-08-19 NOTE — ED Notes (Signed)
Pt started to get verbally aggressive calling this RN a "bitch." Pt given applesauce.

## 2020-08-20 LAB — URINE DRUG SCREEN, QUALITATIVE (ARMC ONLY)
Amphetamines, Ur Screen: NOT DETECTED
Barbiturates, Ur Screen: NOT DETECTED
Benzodiazepine, Ur Scrn: NOT DETECTED
Cannabinoid 50 Ng, Ur ~~LOC~~: NOT DETECTED
Cocaine Metabolite,Ur ~~LOC~~: NOT DETECTED
MDMA (Ecstasy)Ur Screen: NOT DETECTED
Methadone Scn, Ur: NOT DETECTED
Opiate, Ur Screen: NOT DETECTED
Phencyclidine (PCP) Ur S: NOT DETECTED
Tricyclic, Ur Screen: NOT DETECTED

## 2020-08-20 LAB — RESPIRATORY PANEL BY RT PCR (FLU A&B, COVID)
Influenza A by PCR: NEGATIVE
Influenza B by PCR: NEGATIVE
SARS Coronavirus 2 by RT PCR: NEGATIVE

## 2020-08-20 MED ORDER — LORAZEPAM 0.5 MG PO TABS
0.5000 mg | ORAL_TABLET | Freq: Once | ORAL | Status: AC
  Administered 2020-08-20: 0.5 mg via ORAL
  Filled 2020-08-20: qty 1

## 2020-08-20 NOTE — ED Notes (Signed)
Pt walked with walker to restroom.

## 2020-08-20 NOTE — ED Notes (Signed)
Pt calls this RN over to talk. Pt gives rambling tangential conversation about [address], [place], opinions about [employer], and various other things. Pt requests another cup of coffee. Pt expresses desire to go outside and smoke a cigarette.

## 2020-08-20 NOTE — ED Notes (Signed)
Pt accidentally spills urinal in bed. Pt with soiled linens and clothes. New clothes provided, though pt reluctant to allow linens to be changed. Eventually allows soiled linens to be taken up and bedding cleaned.   Pt irritable and feigns aggression towards security by waving his arms and gesturing, though backs down immediately and states "I'm only playing." Pt agitated, requests another cup of coffee. Cup of decaf provided. Pt returned to bed, cooperative at this time

## 2020-08-20 NOTE — ED Notes (Signed)
Pt falls asleep with drink in hand, spills on self. Bedding and shirt changed
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
## 2020-08-20 NOTE — ED Provider Notes (Signed)
Patient was calm eating breakfast sandwich, but now that he is ready for transfer he is become upset with staff.  And yelling out cursory language.  Will give small dose of oral Ativan.   Sharyn Creamer, MD 08/20/20 [phone]

## 2020-08-20 NOTE — ED Notes (Signed)
Pt woke up suddenly and briefly shouted some words in the direction of security. Pt stands up out of bed and shakily made his way to the foot of the bed, declining assistance. Pt returned to sitting in bed after a few moments.   Pt agitated and irritable. 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 Pt given cup of decaf coffee per request.   This RN attempted to tell pt about admission to [facility] and morning transfer. Pt immediately became angry at the mention of [facility], stating, "I'm gonna kill somebody if I go there. I ain't going to a mental place, because I'm not crazy. This is all an act."  Pt eventually able to be calmed and redirected.

## 2020-08-20 NOTE — ED Notes (Signed)
[place]  [employer]  DEPT  CALLED  FOR  TRANSPORT TO  [facility] 

## 2020-08-20 NOTE — ED Notes (Signed)
Pt given cup of decaf per request. Pt frequently calls out "I'm ready to talk again, come on over here." Security engaging in polite conversation with patient, as well as this RN when available

## 2020-08-20 NOTE — ED Provider Notes (Signed)
Vitals:   08/19/20 1943 08/20/20 0611  BP: (!) 129/91 115/68  Pulse: 99 96  Resp: 20 18  Temp: 98.5 F (36.9 C) 98.6 F (37 C)  SpO2: 97% 97%    Patient is resting in hallway, currently eating breakfast sandwich without distress.  Patient has been accepted to [facility]   Sharyn Creamer, MD 08/20/20 [phone]
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
## 2020-08-20 NOTE — ED Provider Notes (Signed)
Emergency Medicine Observation Re-evaluation Note  Luis Cooper is a 79 y.o. male, seen on rounds today.  Pt initially presented to the ED for complaints of ivc   Physical Exam  BP (!) 129/91 (BP Location: Left Arm)   Pulse 99   Temp 98.5 F (36.9 C) (Oral)   Resp 20   Ht 5\' 2"  (1.575 m)   Wt 58 kg   SpO2 97%   BMI 23.39 kg/m  Physical Exam General: Patient is currently sleeping comfortably.  Earlier he was up and moving around his bed sitting on the side of the bed.  He has not been disruptive so far this shift Lungs: Patient is not having any retractions or respiratory distress Psych: Patient is currently not agitated  ED Course / MDM  EKG:    I have reviewed the labs performed to date as well as medications administered while in observation.    Plan  .  Patient is awaiting disposition per psychiatry.  He is currently involuntarily committed   , MD 08/20/20 [phone]

## 2021-02-10 ENCOUNTER — Other Ambulatory Visit: Payer: Self-pay

## 2021-02-10 ENCOUNTER — Emergency Department
Admission: EM | Admit: 2021-02-10 | Discharge: 2021-02-19 | Disposition: A | Discharge status: Home / Self Care | Payer: Medicare Other | Attending: Emergency Medicine | Admitting: Emergency Medicine

## 2021-02-10 DIAGNOSIS — Z046 Encounter for general psychiatric examination, requested by authority: Secondary | ICD-10-CM | POA: Diagnosis present

## 2021-02-10 DIAGNOSIS — Z79899 Other long term (current) drug therapy: Secondary | ICD-10-CM | POA: Insufficient documentation

## 2021-02-10 DIAGNOSIS — Z20822 Contact with and (suspected) exposure to covid-19: Secondary | ICD-10-CM | POA: Diagnosis not present

## 2021-02-10 DIAGNOSIS — E119 Type 2 diabetes mellitus without complications: Secondary | ICD-10-CM | POA: Diagnosis not present

## 2021-02-10 DIAGNOSIS — R259 Unspecified abnormal involuntary movements: Secondary | ICD-10-CM | POA: Insufficient documentation

## 2021-02-10 DIAGNOSIS — M109 Gout, unspecified: Secondary | ICD-10-CM | POA: Diagnosis present

## 2021-02-10 DIAGNOSIS — Z7984 Long term (current) use of oral hypoglycemic drugs: Secondary | ICD-10-CM | POA: Insufficient documentation

## 2021-02-10 DIAGNOSIS — F3110 Bipolar disorder, current episode manic without psychotic features, unspecified: Secondary | ICD-10-CM | POA: Diagnosis present

## 2021-02-10 DIAGNOSIS — Z5900 Homelessness unspecified: Secondary | ICD-10-CM

## 2021-02-10 DIAGNOSIS — F3112 Bipolar disorder, current episode manic without psychotic features, moderate: Secondary | ICD-10-CM | POA: Diagnosis not present

## 2021-02-10 DIAGNOSIS — I10 Essential (primary) hypertension: Secondary | ICD-10-CM | POA: Diagnosis not present

## 2021-02-10 DIAGNOSIS — F17210 Nicotine dependence, cigarettes, uncomplicated: Secondary | ICD-10-CM | POA: Diagnosis not present

## 2021-02-10 LAB — COMPREHENSIVE METABOLIC PANEL
ALT: 21 U/L (ref 0–44)
AST: 36 U/L (ref 15–41)
Albumin: 3.7 g/dL (ref 3.5–5.0)
Alkaline Phosphatase: 123 U/L (ref 38–126)
Anion gap: 9 (ref 5–15)
BUN: 23 mg/dL (ref 8–23)
CO2: 23 mmol/L (ref 22–32)
Calcium: 9.5 mg/dL (ref 8.9–10.3)
Chloride: 110 mmol/L (ref 98–111)
Creatinine, Ser: 1.26 mg/dL — ABNORMAL HIGH (ref 0.61–1.24)
GFR, Estimated: 58 mL/min — ABNORMAL LOW (ref >60)
Glucose, Bld: 134 mg/dL — ABNORMAL HIGH (ref 70–99)
Potassium: 3.7 mmol/L (ref 3.5–5.1)
Sodium: 142 mmol/L (ref 135–145)
Total Bilirubin: 0.9 mg/dL (ref 0.3–1.2)
Total Protein: 7.3 g/dL (ref 6.5–8.1)

## 2021-02-10 LAB — CBC
HCT: 36.9 % — ABNORMAL LOW (ref 39.0–52.0)
Hemoglobin: 12.5 g/dL — ABNORMAL LOW (ref 13.0–17.0)
MCH: 30.7 pg (ref 26.0–34.0)
MCHC: 33.9 g/dL (ref 30.0–36.0)
MCV: 90.7 fL (ref 80.0–100.0)
Platelets: 186 10*3/uL (ref 150–400)
RBC: 4.07 MIL/uL — ABNORMAL LOW (ref 4.22–5.81)
RDW: 13.2 % (ref 11.5–15.5)
WBC: 7.1 10*3/uL (ref 4.0–10.5)
nRBC: 0 % (ref 0.0–0.2)

## 2021-02-10 LAB — RESP PANEL BY RT-PCR (FLU A&B, COVID) ARPGX2
Influenza A by PCR: NEGATIVE
Influenza B by PCR: NEGATIVE
SARS Coronavirus 2 by RT PCR: NEGATIVE

## 2021-02-10 LAB — ETHANOL: Alcohol, Ethyl (B): <10 mg/dL (ref <10)

## 2021-02-10 MED ORDER — DIPHENHYDRAMINE HCL 50 MG/ML IJ SOLN
50.0000 mg | Freq: Once | INTRAMUSCULAR | Status: AC
  Administered 2021-02-10: 50 mg via INTRAMUSCULAR
  Filled 2021-02-10: qty 1

## 2021-02-10 MED ORDER — HALOPERIDOL LACTATE 5 MG/ML IJ SOLN
5.0000 mg | Freq: Once | INTRAMUSCULAR | Status: AC
  Administered 2021-02-10: 5 mg via INTRAMUSCULAR
  Filled 2021-02-10: qty 1
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
## 2021-02-10 NOTE — ED Notes (Signed)
Pt dressed out into hospital attire. Pt's belongings to include: 1 gray shirt 1 brown pants 1 brown belt 2 black shoes 1 black bracelet 1 blue underwear

## 2021-02-10 NOTE — BH Assessment (Signed)
This writer attempted to assess patient but patient was unable to participate due to intoxication and medications administered. Will follow-up
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
## 2021-02-11 DIAGNOSIS — F3110 Bipolar disorder, current episode manic without psychotic features, unspecified: Secondary | ICD-10-CM

## 2021-02-11 LAB — VALPROIC ACID LEVEL: Valproic Acid Lvl: <10 ug/mL — ABNORMAL LOW (ref 50.0–100.0)

## 2021-02-11 LAB — URINE DRUG SCREEN, QUALITATIVE (ARMC ONLY)
Amphetamines, Ur Screen: NOT DETECTED
Barbiturates, Ur Screen: NOT DETECTED
Benzodiazepine, Ur Scrn: NOT DETECTED
Cannabinoid 50 Ng, Ur ~~LOC~~: NOT DETECTED
Cocaine Metabolite,Ur ~~LOC~~: NOT DETECTED
MDMA (Ecstasy)Ur Screen: NOT DETECTED
Methadone Scn, Ur: NOT DETECTED
Opiate, Ur Screen: NOT DETECTED
Phencyclidine (PCP) Ur S: NOT DETECTED
Tricyclic, Ur Screen: NOT DETECTED

## 2021-02-11 MED ORDER — DIVALPROEX SODIUM 500 MG PO DR TAB
500.0000 mg | DELAYED_RELEASE_TABLET | Freq: Two times a day (BID) | ORAL | Status: DC
  Administered 2021-02-11 – 2021-02-19 (×15): 500 mg via ORAL
  Filled 2021-02-11 (×17): qty 1

## 2021-02-11 MED ORDER — LORAZEPAM 2 MG/ML IJ SOLN: 0.0000 mg | Freq: Four times a day (QID) | INTRAMUSCULAR | Status: AC

## 2021-02-11 MED ORDER — LORAZEPAM 2 MG/ML IJ SOLN
0.0000 mg | Freq: Two times a day (BID) | INTRAMUSCULAR | Status: AC
Start: 2021-02-13 — End: 2021-02-15

## 2021-02-11 MED ORDER — THIAMINE HCL 100 MG PO TABS
100.0000 mg | ORAL_TABLET | Freq: Every day | ORAL | Status: DC
  Administered 2021-02-12 – 2021-02-19 (×7): 100 mg via ORAL
  Filled 2021-02-11 (×9): qty 1

## 2021-02-11 MED ORDER — THIAMINE HCL 100 MG/ML IJ SOLN: 100.0000 mg | Freq: Every day | INTRAMUSCULAR | Status: DC

## 2021-02-11 MED ORDER — LORAZEPAM 2 MG PO TABS
0.0000 mg | ORAL_TABLET | Freq: Two times a day (BID) | ORAL | Status: AC
Start: 2021-02-13 — End: 2021-02-15
  Administered 2021-02-14 (×3): 2 mg via ORAL
  Filled 2021-02-11 (×3): qty 1

## 2021-02-11 MED ORDER — AMLODIPINE BESYLATE 5 MG PO TABS
5.0000 mg | ORAL_TABLET | Freq: Every day | ORAL | Status: DC
  Administered 2021-02-14 – 2021-02-19 (×6): 5 mg via ORAL
  Filled 2021-02-11 (×8): qty 1

## 2021-02-11 MED ORDER — METFORMIN HCL 500 MG PO TABS
1000.0000 mg | ORAL_TABLET | Freq: Two times a day (BID) | ORAL | Status: DC
  Administered 2021-02-12 – 2021-02-19 (×9): 1000 mg via ORAL
  Filled 2021-02-11 (×12): qty 2

## 2021-02-11 MED ORDER — OLANZAPINE 5 MG PO TABS
5.0000 mg | ORAL_TABLET | Freq: Every day | ORAL | Status: DC
  Administered 2021-02-11 – 2021-02-18 (×8): 5 mg via ORAL
  Filled 2021-02-11 (×8): qty 1

## 2021-02-11 MED ORDER — LORAZEPAM 2 MG PO TABS
0.0000 mg | ORAL_TABLET | Freq: Four times a day (QID) | ORAL | Status: AC
Start: 2021-02-11 — End: 2021-02-13
  Administered 2021-02-11 – 2021-02-12 (×3): 2 mg via ORAL
  Filled 2021-02-11 (×3): qty 1

## 2021-02-11 NOTE — ED Notes (Signed)
TTS at bedside. 

## 2021-02-11 NOTE — ED Notes (Signed)
Pt refused vitals 

## 2021-02-11 NOTE — ED Notes (Signed)
Hourly rounding performed, patient currently awake in restroom. Patient has no complaints at this time. Q15 minute rounds and monitoring via Rover and Officer to continue. 

## 2021-02-11 NOTE — ED Notes (Signed)
Hourly rounding performed, patient currently asleep in room. Patient has no complaints at this time. Q15 minute rounds and monitoring via Rover and Officer to continue. 

## 2021-02-11 NOTE — ED Notes (Signed)
Dr.Clapacs at bedside  

## 2021-02-11 NOTE — ED Notes (Signed)
Pt continues to step into hallway and try to interact with pts in other rooms, pt yells and curses at staff continuously but is redirectable

## 2021-02-11 NOTE — ED Notes (Signed)
Pt states he is dirty and needs shower. Pt informed of shower times and is provided with clean wipes, pt pleased and wiping self off now.

## 2021-02-11 NOTE — ED Notes (Signed)
Report received from [place], [place]. Patient currently sleeping, respirations regular and unlabored. Q15 minute rounds and observation by Psychologist, counselling to continue. Will assess patient once awake.
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
## 2021-02-11 NOTE — ED Notes (Signed)
IVC, pending placement 

## 2021-02-11 NOTE — ED Provider Notes (Signed)
Emergency Medicine Observation Re-evaluation Note  Luis Cooper is a 80 y.o. male, seen on rounds today.  Pt initially presented to the ED for complaints of IVC Currently, the patient is resting comfortably.  Physical Exam  BP 111/63   Pulse 95   Temp 97.6 F (36.4 C) (Oral)   Resp 16   SpO2 99%  Physical Exam Gen: No acute distress  Resp: Normal rise and fall of chest Neuro: Moving all four extremities Psych: Resting currently, intermittently agitated when awake but redirectable    ED Course / MDM  EKG:   I have reviewed the labs performed to date as well as medications administered while in observation.  Recent changes in the last 24 hours include intermittent agitation overnight.  Plan  Current plan is for psychiatric evaluation for further disposition. Patient is under full IVC at this time.   Luis Cooper, Layla Maw, DO 02/11/21 [phone]

## 2021-02-11 NOTE — ED Notes (Signed)
Pt laying in bed, provided with 2 cups of water

## 2021-02-11 NOTE — ED Notes (Signed)
Pt awake, banging on the door and the garage door, but staying in room.
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
## 2021-02-11 NOTE — ED Notes (Signed)
Hourly rounding performed, patient currently awake in room. Patient has no complaints at this time. Q15 minute rounds and monitoring via Rover and Officer to continue. 

## 2021-02-11 NOTE — ED Notes (Signed)
Pt yelling "help" from inside room. Officer goes in and pt states "this bitch has one more chance to get me some water". Informed pt that he will get it breakfast.

## 2021-02-11 NOTE — ED Notes (Signed)
Pt has walked through hall in underwear, when asked to put back on he becomes angry again and throws up his hands into fighting position and stands there. Pt continues to talk and returns to room

## 2021-02-11 NOTE — ED Notes (Signed)
Pt threw meds across the room when handed to him

## 2021-02-11 NOTE — ED Notes (Signed)
Shower refused
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
## 2021-02-11 NOTE — ED Notes (Signed)
Patient awakened out of his sleep yelling and being verbally abusive calling nurse tech "bitch". He also proclaimed to be over the president and asking "where is Genevie Cheshire". He also said he wanted his breakfast RIGHT NOW

## 2021-02-11 NOTE — ED Notes (Signed)
Pt awake at this time  

## 2021-02-11 NOTE — BH Assessment (Signed)
Adult/GERO MH  Referral information for Psychiatric Hospitalization faxed to:   [facility] ([phone])   [facility] [phone]- [phone], [phone])   [name] [phone] or [phone]),  . [name] [phone] -or- [phone]

## 2021-02-12 NOTE — ED Notes (Signed)
Hourly rounding performed, patient currently asleep in room. Patient has no complaints at this time. Q15 minute rounds and monitoring via Rover and Officer to continue. 

## 2021-02-12 NOTE — ED Notes (Addendum)
Patient fell in room next to his bed, notified Amber R. RN, patient stated he was alright helped patient back up to his bed.

## 2021-02-12 NOTE — ED Notes (Signed)
INVOLUNTARY/awaiting placement °

## 2021-02-12 NOTE — ED Notes (Signed)
Pt back to sleep now

## 2021-02-12 NOTE — ED Notes (Signed)
Pt asleep at this time, unable to collect vitals. Will collect pt vitals once awake. 

## 2021-02-12 NOTE — ED Notes (Signed)
Pts bedding and clothing soiled. New bedding and purple scrubs applied.

## 2021-02-12 NOTE — ED Provider Notes (Signed)
Emergency Medicine Observation Re-evaluation Note  Luis Cooper is a 80 y.o. male, seen on rounds today.  Pt initially presented to the ED for complaints of IVC Currently, the patient is resting comfortably.  Physical Exam  BP (!) 100/57   Pulse 76   Temp 97.8 F (36.6 C) (Oral)   Resp 18   SpO2 95%  Physical Exam Constitutional:      Appearance: He is not ill-appearing or toxic-appearing.  HENT:     Head: Atraumatic.  Cardiovascular:     Comments: Well perfused Pulmonary:     Effort: Pulmonary effort is normal.  Abdominal:     General: There is no distension.  Musculoskeletal:        General: No deformity.  Skin:    Findings: No rash.  Neurological:     General: No focal deficit present.     Cranial Nerves: No cranial nerve deficit.     ED Course / MDM  EKG:   I have reviewed the labs performed to date as well as medications administered while in observation.  Recent changes in the last 24 hours include evaluation by psychiatry recommending inpatient admission with [facility] psychiatry.  Plan  Current plan is for [facility] psych placement. Patient is under full IVC at this time.   Delton Prairie, MD 02/12/21 [phone]

## 2021-02-12 NOTE — ED Notes (Signed)
Pt again yelling out in room, states he needs to urinate. Pt has ambulated on own and well through night but still refuses to go to restroom. Pt denies vitals at this time, will continue to monitor.

## 2021-02-12 NOTE — ED Notes (Signed)
Hourly rounding performed, patient currently awake in room. Patient has no complaints at this time. Q15 minute rounds and monitoring via Rover and Officer to continue. 

## 2021-02-12 NOTE — BH Assessment (Signed)
Referral Check for Psychiatric Hospitalization faxed to:   Earlene Plater ([phone]) No answer at any 3 numbers   [facility] [phone] -or- [phone], [phone] fx) No behavioral health intake staff until after 8am   [facility] [phone] or [phone]), Staff reports no intake nurse overnight and to contact back after 7:30am  . [name] [phone] -or- [phone]) No answer with behavioral health intake

## 2021-02-12 NOTE — Consult Note (Signed)
[facility] Face-to-Face Psychiatry Consult   Reason for Consult: Follow-up consult for this 80 year old man with bipolar disorder who has been in the emergency room Referring Physician: Fuller Plan Patient Identification: Luis Cooper MRN:  [id number] Principal Diagnosis: Manic bipolar I disorder (HCC) Diagnosis:  Principal Problem:   Manic bipolar I disorder (HCC) Active Problems:   Diabetes mellitus without complication (HCC)   Gout   Hypertension   Total Time spent with patient: 30 minutes  Subjective:   Luis Cooper is a 80 y.o. male patient admitted with "I want to go".  HPI: Patient seen chart reviewed.  80 year old man with bipolar disorder who has been living on the street not taking care of himself disrupting things in public starting fights with people in public.  Meets criteria for IVC.  Patient has been referred to geriatric units.  Vitals currently stable.  Mostly compliant with medicine  Past Psychiatric History: Past history of bipolar disorder with multiple manic spells  Risk to Self:   Risk to Others:   Prior Inpatient Therapy:   Prior Outpatient Therapy:    Past Medical History:  Past Medical History:  Diagnosis Date  . Bipolar 1 disorder (HCC)   . Diabetes mellitus without complication (HCC)   . Gout    History reviewed. No pertinent surgical history. Family History: No family history on file. Family Psychiatric  History: See previous Social History:  Social History   Substance and Sexual Activity  Alcohol Use Yes   Comment: today     Social History   Substance and Sexual Activity  Drug Use Never    Social History   Socioeconomic History  . Marital status: Single    Spouse name: Not on file  . Number of children: Not on file  . Years of education: Not on file  . Highest education level: Not on file  Occupational History  . Not on file  Tobacco Use  . Smoking status: Current Every Day Smoker    Packs/day: 0.50    Types: Cigarettes  .  Smokeless tobacco: Never Used  Vaping Use  . Vaping Use: Never used  Substance and Sexual Activity  . Alcohol use: Yes    Comment: today  . Drug use: Never  . Sexual activity: Not on file  Other Topics Concern  . Not on file  Social History Narrative  . Not on file   Social Determinants of Health   Financial Resource Strain: Not on file  Food Insecurity: Not on file  Transportation Needs: Not on file  Physical Activity: Not on file  Stress: Not on file  Social Connections: Not on file   Additional Social History:    Allergies:  No Known Allergies  Labs:  Results for orders placed or performed during the hospital encounter of 02/10/21 (from the past 48 hour(s))  Comprehensive metabolic panel     Status: Abnormal   Collection Time: 02/10/21  7:28 PM  Result Value Ref Range   Sodium 142 135 - 145 mmol/L   Potassium 3.7 3.5 - 5.1 mmol/L   Chloride 110 98 - 111 mmol/L   CO2 23 22 - 32 mmol/L   Glucose, Bld 134 (H) 70 - 99 mg/dL    Comment: Glucose reference range applies only to samples taken after fasting for at least 8 hours.   BUN 23 8 - 23 mg/dL   Creatinine, Ser 8.52 (H) 0.61 - 1.24 mg/dL   Calcium 9.5 8.9 - 77.8 mg/dL   Total Protein 7.3 6.5 -  8.1 g/dL   Albumin 3.7 3.5 - 5.0 g/dL   AST 36 15 - 41 U/L   ALT 21 0 - 44 U/L   Alkaline Phosphatase 123 38 - 126 U/L   Total Bilirubin 0.9 0.3 - 1.2 mg/dL   GFR, Estimated 58 (L) >60 mL/min    Comment: (NOTE) Calculated using the CKD-EPI Creatinine Equation (2021)    Anion gap 9 5 - 15    Comment: Performed at [facility], [address] [place], [place] [address]  Ethanol     Status: None   Collection Time: 02/10/21  7:28 PM  Result Value Ref Range   Alcohol, Ethyl (B) <10 <10 mg/dL    Comment: (NOTE) Lowest detectable limit for serum alcohol is 10 mg/dL.  For medical purposes only. Performed at [facility], [address] [place], [place] [address]   cbc     Status: Abnormal    Collection Time: 02/10/21  7:28 PM  Result Value Ref Range   WBC 7.1 4.0 - 10.5 K/uL   RBC 4.07 (L) 4.22 - 5.81 MIL/uL   Hemoglobin 12.5 (L) 13.0 - 17.0 g/dL   HCT 37.9 (L) 02.4 - 09.7 %   MCV 90.7 80.0 - 100.0 fL   MCH 30.7 26.0 - 34.0 pg   MCHC 33.9 30.0 - 36.0 g/dL   RDW 35.3 29.9 - 24.2 %   Platelets 186 150 - 400 K/uL   nRBC 0.0 0.0 - 0.2 %    Comment: Performed at [facility], [address] [place], [place] [address]  Valproic acid level     Status: Abnormal   Collection Time: 02/10/21  7:28 PM  Result Value Ref Range   Valproic Acid Lvl <10 (L) 50.0 - 100.0 ug/mL    Comment: RESULTS CONFIRMED BY MANUAL DILUTION HNM Performed at [facility], [address] [place], [place] [address]   Resp Panel by RT-PCR (Flu A&B, Covid) Nasopharyngeal Swab     Status: None   Collection Time: 02/10/21  7:30 PM   Specimen: Nasopharyngeal Swab; Nasopharyngeal(NP) swabs in vial transport medium  Result Value Ref Range   SARS Coronavirus 2 by RT PCR NEGATIVE NEGATIVE    Comment: (NOTE) SARS-CoV-2 target nucleic acids are NOT DETECTED.  The SARS-CoV-2 RNA is generally detectable in upper respiratory specimens during the acute phase of infection. The lowest concentration of SARS-CoV-2 viral copies this assay can detect is 138 copies/mL. A negative result does not preclude SARS-Cov-2 infection and should not be used as the sole basis for treatment or other patient management decisions. A negative result may occur with  improper specimen collection/handling, submission of specimen other than nasopharyngeal swab, presence of viral mutation(s) within the areas targeted by this assay, and inadequate number of viral copies(<138 copies/mL). A negative result must be combined with clinical observations, patient history, and epidemiological information. The expected result is Negative.  Fact Sheet for Patients:  BloggerCourse.com  Fact Sheet for  Healthcare Providers:  SeriousBroker.it  This test is no t yet approved or cleared by the [place] FDA and  has been authorized for detection and/or diagnosis of SARS-CoV-2 by FDA under an Emergency Use Authorization (EUA). This EUA will remain  in effect (meaning this test can be used) for the duration of the COVID-19 declaration under Section 564(b)(1) of the Act, 21 U.S.C.section 360bbb-3(b)(1), unless the authorization is terminated  or revoked sooner.       Influenza A by PCR NEGATIVE NEGATIVE  Influenza B by PCR NEGATIVE NEGATIVE    Comment: (NOTE) The Xpert Xpress SARS-CoV-2/FLU/RSV plus assay is intended as an aid in the diagnosis of influenza from Nasopharyngeal swab specimens and should not be used as a sole basis for treatment. Nasal washings and aspirates are unacceptable for Xpert Xpress SARS-CoV-2/FLU/RSV testing.  Fact Sheet for Patients: BloggerCourse.comhttps://www.fda.gov/media/152166/download  Fact Sheet for Healthcare Providers: SeriousBroker.ithttps://www.fda.gov/media/152162/download  This test is not yet approved or cleared by the [place] FDA and has been authorized for detection and/or diagnosis of SARS-CoV-2 by FDA under an Emergency Use Authorization (EUA). This EUA will remain in effect (meaning this test can be used) for the duration of the COVID-19 declaration under Section 564(b)(1) of the Act, 21 U.S.C. section 360bbb-3(b)(1), unless the authorization is terminated or revoked.  Performed at [facility], [address] [place], [place] [address]   Urine Drug Screen, Qualitative     Status: None   Collection Time: 02/11/21  2:23 AM  Result Value Ref Range   Tricyclic, Ur Screen NONE DETECTED NONE DETECTED   Amphetamines, Ur Screen NONE DETECTED NONE DETECTED   MDMA (Ecstasy)Ur Screen NONE DETECTED NONE DETECTED   Cocaine Metabolite,Ur [place] NONE DETECTED NONE DETECTED   Opiate, Ur Screen NONE DETECTED NONE DETECTED    Phencyclidine (PCP) Ur S NONE DETECTED NONE DETECTED   Cannabinoid 50 Ng, Ur [employer] NONE DETECTED NONE DETECTED   Barbiturates, Ur Screen NONE DETECTED NONE DETECTED   Benzodiazepine, Ur Scrn NONE DETECTED NONE DETECTED   Methadone Scn, Ur NONE DETECTED NONE DETECTED    Comment: (NOTE) Tricyclics + metabolites, urine    Cutoff 1000 ng/mL Amphetamines + metabolites, urine  Cutoff 1000 ng/mL MDMA (Ecstasy), urine              Cutoff 500 ng/mL Cocaine Metabolite, urine          Cutoff 300 ng/mL Opiate + metabolites, urine        Cutoff 300 ng/mL Phencyclidine (PCP), urine         Cutoff 25 ng/mL Cannabinoid, urine                 Cutoff 50 ng/mL Barbiturates + metabolites, urine  Cutoff 200 ng/mL Benzodiazepine, urine              Cutoff 200 ng/mL Methadone, urine                   Cutoff 300 ng/mL  The urine drug screen provides only a preliminary, unconfirmed analytical test result and should not be used for non-medical purposes. Clinical consideration and professional judgment should be applied to any positive drug screen result due to possible interfering substances. A more specific alternate chemical method must be used in order to obtain a confirmed analytical result. Gas chromatography / mass spectrometry (GC/MS) is the preferred confirm atory method. Performed at [facility], [address] [place], [place] [address]     Current Facility-Administered Medications  Medication Dose Route Frequency Provider Last Rate Last Admin  . amLODipine (NORVASC) tablet 5 mg  5 mg Oral Daily Ward, Kristen N, DO      . divalproex (DEPAKOTE) DR tablet 500 mg  500 mg Oral Q12H Ward, Kristen N, DO   500 mg at 02/12/21 0940  . LORazepam (ATIVAN) injection 0-4 mg  0-4 mg Intravenous Q6H Ward, Kristen N, DO       Or  . LORazepam (ATIVAN) tablet 0-4 mg  0-4 mg Oral Q6H Ward, Baxter HireKristen  N, DO   2 mg at 02/12/21 0940  . [START ON 02/13/2021] LORazepam (ATIVAN) injection 0-4 mg  0-4 mg Intravenous  Q12H Ward, Kristen N, DO       Or  . Melene Muller ON 02/13/2021] LORazepam (ATIVAN) tablet 0-4 mg  0-4 mg Oral Q12H Ward, Kristen N, DO      . metFORMIN (GLUCOPHAGE) tablet 1,000 mg  1,000 mg Oral BID WC Ward, Kristen N, DO   1,000 mg at 02/12/21 0941  . OLANZapine (ZYPREXA) tablet 5 mg  5 mg Oral QHS Ward, Kristen N, DO   5 mg at 02/11/21 2125  . thiamine tablet 100 mg  100 mg Oral Daily Ward, Kristen N, DO   100 mg at 02/12/21 0941   Or  . thiamine (B-1) injection 100 mg  100 mg Intravenous Daily Ward, Kristen N, DO       Current Outpatient Medications  Medication Sig Dispense Refill  . amLODipine (NORVASC) 5 MG tablet Take 1 tablet (5 mg total) by mouth daily. 30 tablet 1  . divalproex (DEPAKOTE) 500 MG DR tablet Take 1 tablet (500 mg total) by mouth every 12 (twelve) hours. 60 tablet 1  . ibuprofen (ADVIL) 600 MG tablet Take 1 tablet (600 mg total) by mouth in the morning and at bedtime. (Patient not taking: Reported on 08/19/2020) 30 tablet 1  . metFORMIN (GLUCOPHAGE) 1000 MG tablet Take 1 tablet (1,000 mg total) by mouth 2 (two) times daily with a meal. 60 tablet 1  . OLANZapine (ZYPREXA) 5 MG tablet Take 1 tablet (5 mg total) by mouth at bedtime. 30 tablet 1  . polyethylene glycol (MIRALAX / GLYCOLAX) 17 g packet Take 17 g by mouth daily. (Patient not taking: Reported on 08/19/2020) 14 each 1    Musculoskeletal: Strength & Muscle Tone: within normal limits Gait & Station: normal Patient leans: N/A            Psychiatric Specialty Exam:  Presentation  General Appearance: No data recorded Eye Contact:No data recorded Speech:No data recorded Speech Volume:No data recorded Handedness:No data recorded  Mood and Affect  Mood:No data recorded Affect:No data recorded  Thought Process  Thought Processes:No data recorded Descriptions of Associations:No data recorded Orientation:No data recorded Thought Content:No data recorded History of Schizophrenia/Schizoaffective  disorder:No data recorded Duration of Psychotic Symptoms:No data recorded Hallucinations:No data recorded Ideas of Reference:No data recorded Suicidal Thoughts:No data recorded Homicidal Thoughts:No data recorded  Sensorium  Memory:No data recorded Judgment:No data recorded Insight:No data recorded  Executive Functions  Concentration:No data recorded Attention Span:No data recorded Recall:No data recorded Fund of Knowledge:No data recorded Language:No data recorded  Psychomotor Activity  Psychomotor Activity:No data recorded  Assets  Assets:No data recorded  Sleep  Sleep:No data recorded  Physical Exam: Physical Exam Vitals and nursing note reviewed.  Constitutional:      Appearance: Normal appearance.  HENT:     Head: Normocephalic and atraumatic.     Mouth/Throat:     Pharynx: Oropharynx is clear.  Eyes:     Pupils: Pupils are equal, round, and reactive to light.  Cardiovascular:     Rate and Rhythm: Normal rate and regular rhythm.  Pulmonary:     Effort: Pulmonary effort is normal.     Breath sounds: Normal breath sounds.  Abdominal:     General: Abdomen is flat.     Palpations: Abdomen is soft.  Musculoskeletal:        General: Normal range of motion.  Skin:  General: Skin is warm and dry.  Neurological:     General: No focal deficit present.     Mental Status: He is alert. Mental status is at baseline.  Psychiatric:        Attention and Perception: He is inattentive.        Mood and Affect: Mood normal. Affect is labile.        Speech: He is noncommunicative.        Behavior: Behavior is agitated. Behavior is not aggressive.        Thought Content: Thought content normal.        Cognition and Memory: Memory is impaired.        Judgment: Judgment is impulsive.    Review of Systems  Constitutional: Negative.   HENT: Negative.   Eyes: Negative.   Respiratory: Negative.   Cardiovascular: Negative.   Gastrointestinal: Negative.    Musculoskeletal: Negative.   Skin: Negative.   Neurological: Negative.   Psychiatric/Behavioral: The patient is nervous/anxious.    Blood pressure 105/66, pulse 76, temperature 97.9 F (36.6 C), temperature source Oral, resp. rate 18, SpO2 96 %. There is no height or weight on file to calculate BMI.  Treatment Plan Summary: Medication management and Plan Continue current orders for psychiatric and medical medications.  Patient continues to be most appropriate for admission to geriatric psychiatry unit.  Case reviewed with emergency room physician and patient.  Disposition: Recommend psychiatric Inpatient admission when medically cleared. Supportive therapy provided about ongoing stressors.  Mordecai Rasmussen, MD 02/12/2021 1:46 PM

## 2021-02-12 NOTE — ED Notes (Signed)
,  Hourly rounding performed, patient currently asleep in room. Patient has no complaints at this time. Q15 minute rounds and monitoring via Rover and Officer to continue. 

## 2021-02-13 LAB — CBG MONITORING, ED: Glucose-Capillary: 76 mg/dL (ref 70–99)

## 2021-02-13 NOTE — ED Notes (Signed)
Pt given lunch tray and juice att. 

## 2021-02-13 NOTE — ED Notes (Signed)
Pt brief changed and new linens given.

## 2021-02-13 NOTE — ED Notes (Signed)
Pt given breakfast tray and juice att. 

## 2021-02-13 NOTE — BH Assessment (Signed)
Referral Checks:   Luis Cooper ([phone]) left a voicemail requesting a return phone call.   [facility](-[phone]- [phone] fx) Per Tammy, pt denied due to acuity and combativeness.   [name] [phone] or [phone]), Staff reports no intake nurse overnight and to contact back after 7:30am  . [name] [phone] -or- [phone]) Per Tobi Bastos, denied due to behaviors punching people in the face.

## 2021-02-13 NOTE — ED Notes (Addendum)
Pt now sitting in recliner. Refused to eat breakfast but drank 4 oz juice. Pt clean and dry.

## 2021-02-13 NOTE — ED Notes (Signed)
IVC pending placement 

## 2021-02-13 NOTE — ED Notes (Signed)
Pt ambulatory to and from restroom without difficulty.

## 2021-02-13 NOTE — ED Notes (Signed)
Refused to take PO meds.

## 2021-02-13 NOTE — ED Notes (Signed)
Pt found to have removed all clothing. Pt assisted in putting clothes and brief back on by [employer]. Pt cooperative with remaining clothed at this time.

## 2021-02-13 NOTE — ED Provider Notes (Signed)
Emergency Medicine Observation Re-evaluation Note  Abednego Yeates is a 80 y.o. male, seen on rounds today.  Pt initially presented to the ED for complaints of IVC Currently, the patient is resting comfortably.  Physical Exam  BP 109/77 (BP Location: Left Arm)   Pulse 98   Temp 98.8 F (37.1 C) (Oral)   Resp 16   SpO2 95%  Physical Exam Gen: No acute distress  Resp: Normal rise and fall of chest Neuro: Moving all four extremities Psych: Resting currently, calm and cooperative when awake    ED Course / MDM  EKG:   I have reviewed the labs performed to date as well as medications administered while in observation.  Recent changes in the last 24 hours include no acute events overnight.  Plan  Current plan is for psychiatric inpatient treatment Patient is under full IVC at this time.   Rony Ratz, Layla Maw, DO 02/13/21 [phone]

## 2021-02-13 NOTE — ED Notes (Signed)
Incontinent of urine, pt changed with EDT assist.

## 2021-02-14 NOTE — ED Notes (Signed)
Pt voided on self, changed into new scrubs by this tech and Chief Operating Officer. Pt placed bk onto chair.

## 2021-02-14 NOTE — ED Notes (Signed)
Pt up to bedside commode with this tech, pt did not void at this time. Pt returned to chair and wanted to eat, fork was provided. Pt drank 4oz apple juice and 4oz cranberry juice at this time.

## 2021-02-14 NOTE — ED Notes (Signed)
Attempted to awaken him for his metformin  - he stated  "leave me alone - not right now"

## 2021-02-14 NOTE — ED Notes (Signed)
He was being assisted to the BR  - pt with no clothes on  - just the provided undergarment and yellow socks  He insisted on going to the BR prior to getting dressed  EDT was walking with him and I could hear the patient stating  "Just let me do it - I can walk"  EDT insisted on walking with him  - as they were in the BR and he was pivoting to use the BR he fell back onto his buttocks   He denies injury  EDT and I assisted him onto his feet and then onto the toilet    I dressed him and he and I walked back to his recliner in room 23  BSC placed in his room

## 2021-02-14 NOTE — ED Notes (Signed)
Pt increasingly restless and anxious. CIWA performed. Pt taking clothes off left in underwear. Pt refusing to put clothes back on at this time. Pt laid down in bed and covered with blanket at this time.

## 2021-02-14 NOTE — ED Provider Notes (Signed)
Emergency Medicine Observation Re-evaluation Note  Luis Cooper is a 80 y.o. male, seen on rounds today.  Pt initially presented to the ED for complaints of IVC Currently, the patient is resting, voices no medical complaints.  Physical Exam  BP 128/72   Pulse 83   Temp 97.9 F (36.6 C) (Oral)   Resp 18   SpO2 99%  Physical Exam General: Resting in no acute distress Cardiac: No cyanosis Lungs: Equal rise and fall Psych: Not agitated  ED Course / MDM  EKG:   I have reviewed the labs performed to date as well as medications administered while in observation.  Recent changes in the last 24 hours include no events overnight.  Plan  Current plan is for inpatient psychiatric admission. Patient is under full IVC at this time.   Irean Hong, MD 02/14/21 [phone]

## 2021-02-15 LAB — CBC WITH DIFFERENTIAL/PLATELET
Abs Immature Granulocytes: 0.02 10*3/uL (ref 0.00–0.07)
Basophils Absolute: 0 10*3/uL (ref 0.0–0.1)
Basophils Relative: 1 %
Eosinophils Absolute: 0.2 10*3/uL (ref 0.0–0.5)
Eosinophils Relative: 4 %
HCT: 43.4 % (ref 39.0–52.0)
Hemoglobin: 14.3 g/dL (ref 13.0–17.0)
Immature Granulocytes: 0 %
Lymphocytes Relative: 24 %
Lymphs Abs: 1.3 10*3/uL (ref 0.7–4.0)
MCH: 30.7 pg (ref 26.0–34.0)
MCHC: 32.9 g/dL (ref 30.0–36.0)
MCV: 93.1 fL (ref 80.0–100.0)
Monocytes Absolute: 0.4 10*3/uL (ref 0.1–1.0)
Monocytes Relative: 8 %
Neutro Abs: 3.4 10*3/uL (ref 1.7–7.7)
Neutrophils Relative %: 63 %
Platelets: 216 10*3/uL (ref 150–400)
RBC: 4.66 MIL/uL (ref 4.22–5.81)
RDW: 13.2 % (ref 11.5–15.5)
WBC: 5.4 10*3/uL (ref 4.0–10.5)
nRBC: 0 % (ref 0.0–0.2)

## 2021-02-15 LAB — URINALYSIS, COMPLETE (UACMP) WITH MICROSCOPIC
Bacteria, UA: NONE SEEN
Bilirubin Urine: NEGATIVE
Glucose, UA: NEGATIVE mg/dL
Ketones, ur: 5 mg/dL — AB
Leukocytes,Ua: NEGATIVE
Nitrite: NEGATIVE
Protein, ur: NEGATIVE mg/dL
Specific Gravity, Urine: 1.011 (ref 1.005–1.030)
Squamous Epithelial / HPF: NONE SEEN (ref 0–5)
pH: 7 (ref 5.0–8.0)

## 2021-02-15 LAB — BASIC METABOLIC PANEL
Anion gap: 11 (ref 5–15)
BUN: 21 mg/dL (ref 8–23)
CO2: 24 mmol/L (ref 22–32)
Calcium: 9.6 mg/dL (ref 8.9–10.3)
Chloride: 107 mmol/L (ref 98–111)
Creatinine, Ser: 1.19 mg/dL (ref 0.61–1.24)
GFR, Estimated: >60 mL/min (ref >60)
Glucose, Bld: 84 mg/dL (ref 70–99)
Potassium: 4.3 mmol/L (ref 3.5–5.1)
Sodium: 142 mmol/L (ref 135–145)

## 2021-02-15 LAB — VALPROIC ACID LEVEL: Valproic Acid Lvl: 68 ug/mL (ref 50.0–100.0)

## 2021-02-15 NOTE — ED Notes (Signed)
Pt has been sleeping most of day. Less spunky than previous encounters with this RN. Talks with his eyes closed and goes back to sleep when not sitrred. MD aware. See new orders.

## 2021-02-15 NOTE — ED Notes (Signed)
Pt ate 100% lunch.

## 2021-02-15 NOTE — ED Notes (Signed)
Pt dumped urine on himself. Pt changed, new clothes. Moved from recliner to bed. Covered up. Door remains open and fall socks on. 3/4 siderails up.

## 2021-02-15 NOTE — ED Notes (Signed)
Pt. Is currently sleeping but dinner tray is in pts room.

## 2021-02-15 NOTE — ED Notes (Signed)
IVC pending placement 

## 2021-02-15 NOTE — BH Assessment (Signed)
ReferralChecks:   Luis Cooper ([phone])Voicemail was left   [phone]- [phone], [phone] fx) Per Tammy, pt denied due to acuity and combativeness. (Per Previous TTS)   [facility] [phone] or [phone] reports no intake nurse overnight and to contact back after 7:30am  . [name] [phone] -or- [phone]) Per Tobi Bastos, denied due to behaviors punching people in the face. (Per Previous TTS)

## 2021-02-15 NOTE — ED Notes (Signed)
Pt ate 100% breakfast.

## 2021-02-15 NOTE — ED Notes (Signed)
Pt incontinent of urine. RN and ED tech made aware by security that pt was peeing on the floor. Upon entering room, pt appeared to be sleeping with urine on floor and all over clothes. Peri care performed by ED tech and this RN and pt placed in brief. Pt repositioned onto bed from recliner.   Pt resting comfortably at this time.

## 2021-02-15 NOTE — ED Notes (Signed)
Pt given ice cream and sitting in recliner.

## 2021-02-15 NOTE — ED Notes (Signed)
Attempted to call [facility] and [facility] with no answer.

## 2021-02-15 NOTE — ED Notes (Signed)
Pt changed and give new brief and chux. Mepilex placed on bottom and pt turned to prevent sores.

## 2021-02-16 NOTE — ED Notes (Signed)
Pt given lunch tray.

## 2021-02-16 NOTE — Consult Note (Signed)
[facility]   Reason for Consult: Follow-up consult 80 year old man with mania still in the emergency room Referring Physician: Katrinka BlazingSmith Patient Identification: Luis Cooper MRN:  [id number] Principal Diagnosis: Manic bipolar I disorder (HCC) Diagnosis:  Principal Problem:   Manic bipolar I disorder (HCC) Active Problems:   Diabetes mellitus without complication (HCC)   Gout   Hypertension   Total Time spent with patient: 30 minutes  Subjective:   Luis Cooper is a 80 y.o. male patient admitted with "you cannot keep me here, I have important work".  HPI: Patient seen and chart reviewed.  Patient shouted at me from his room today and had me come in to speak with him.  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  I advised him that we were still referring him to a geriatric psychiatry unit.  He became very irate saying he did not need to go to anywhere geriatric.  Patient is ambulating with a walker.  Generally frail looking but mostly eating okay.  Has been medicine compliant.  Still no offers of any place for him to be admitted  Past Psychiatric History: Past history of bipolar disorder with psychosis.  Worsening and worsening functioning in the community.  Risk to Self:   Risk to Others:   Prior Inpatient Therapy:   Prior Outpatient Therapy:    Past Medical History:  Past Medical History:  Diagnosis Date  . Bipolar 1 disorder (HCC)   . Diabetes mellitus without complication (HCC)   . Gout    History reviewed. No pertinent surgical history. Family History: No family history on file. Family Psychiatric  History: See previous Social History:  Social History   Substance and Sexual Activity  Alcohol Use Yes   Comment: today     Social History   Substance and Sexual Activity  Drug Use Never    Social History   Socioeconomic History  .  Marital status: Single    Spouse name: Not on file  . Number of children: Not on file  . Years of education: Not on file  . Highest education level: Not on file  Occupational History  . Not on file  Tobacco Use  . Smoking status: Current Every Day Smoker    Packs/day: 0.50    Types: Cigarettes  . Smokeless tobacco: Never Used  Vaping Use  . Vaping Use: Never used  Substance and Sexual Activity  . Alcohol use: Yes    Comment: today  . Drug use: Never  . Sexual activity: Not on file  Other Topics Concern  . Not on file  Social History Narrative  . Not on file   Social Determinants of Health   Financial Resource Strain: Not on file  Food Insecurity: Not on file  Transportation Needs: Not on file  Physical Activity: Not on file  Stress: Not on file  Social Connections: Not on file   Additional Social History:    Allergies:  No Known Allergies  Labs:  Results for orders placed or performed during the hospital encounter of 02/10/21 (from the past 48 hour(s))  CBC with Differential     Status: None   Collection Time: 02/15/21  5:51 PM  Result Value Ref Range   WBC 5.4 4.0 - 10.5 K/uL   RBC 4.66 4.22 - 5.81 MIL/uL   Hemoglobin 14.3 13.0 - 17.0 g/dL  HCT 43.4 39.0 - 52.0 %   MCV 93.1 80.0 - 100.0 fL   MCH 30.7 26.0 - 34.0 pg   MCHC 32.9 30.0 - 36.0 g/dL   RDW 16.1 09.6 - 04.5 %   Platelets 216 150 - 400 K/uL   nRBC 0.0 0.0 - 0.2 %   Neutrophils Relative % 63 %   Neutro Abs 3.4 1.7 - 7.7 K/uL   Lymphocytes Relative 24 %   Lymphs Abs 1.3 0.7 - 4.0 K/uL   Monocytes Relative 8 %   Monocytes Absolute 0.4 0.1 - 1.0 K/uL   Eosinophils Relative 4 %   Eosinophils Absolute 0.2 0.0 - 0.5 K/uL   Basophils Relative 1 %   Basophils Absolute 0.0 0.0 - 0.1 K/uL   Immature Granulocytes 0 %   Abs Immature Granulocytes 0.02 0.00 - 0.07 K/uL    Comment: Performed at [facility], [address], [place], [place] [address]  Basic metabolic panel     Status: None    Collection Time: 02/15/21  5:51 PM  Result Value Ref Range   Sodium 142 135 - 145 mmol/L   Potassium 4.3 3.5 - 5.1 mmol/L   Chloride 107 98 - 111 mmol/L   CO2 24 22 - 32 mmol/L   Glucose, Bld 84 70 - 99 mg/dL    Comment: Glucose reference range applies only to samples taken after fasting for at least 8 hours.   BUN 21 8 - 23 mg/dL   Creatinine, Ser 1.91 0.61 - 1.24 mg/dL   Calcium 9.6 8.9 - 47.8 mg/dL   GFR, Estimated >29 >56 mL/min    Comment: (NOTE) Calculated using the CKD-EPI Creatinine Equation (2021)    Anion gap 11 5 - 15    Comment: Performed at [facility], [address] [place], [place] [address]  Urinalysis, Complete w Microscopic Urine, Catheterized     Status: Abnormal   Collection Time: 02/15/21  5:51 PM  Result Value Ref Range   Color, Urine YELLOW (A) YELLOW   APPearance CLEAR (A) CLEAR   Specific Gravity, Urine 1.011 1.005 - 1.030   pH 7.0 5.0 - 8.0   Glucose, UA NEGATIVE NEGATIVE mg/dL   Hgb urine dipstick MODERATE (A) NEGATIVE   Bilirubin Urine NEGATIVE NEGATIVE   Ketones, ur 5 (A) NEGATIVE mg/dL   Protein, ur NEGATIVE NEGATIVE mg/dL   Nitrite NEGATIVE NEGATIVE   Leukocytes,Ua NEGATIVE NEGATIVE   RBC / HPF 11-20 0 - 5 RBC/hpf   WBC, UA 0-5 0 - 5 WBC/hpf   Bacteria, UA NONE SEEN NONE SEEN   Squamous Epithelial / LPF NONE SEEN 0 - 5    Comment: Performed at [facility], [address] [place], [place] [address]  Valproic acid level     Status: None   Collection Time: 02/15/21  5:51 PM  Result Value Ref Range   Valproic Acid Lvl 68 50.0 - 100.0 ug/mL    Comment: Performed at [facility], [address], [place], [place] [address]    Current Facility-Administered Medications  Medication Dose Route Frequency Provider Last Rate Last Admin  . amLODipine (NORVASC) tablet 5 mg  5 mg Oral Daily Ward, Kristen N, DO   5 mg at 02/16/21 1026  . divalproex (DEPAKOTE) DR tablet 500 mg  500 mg Oral Q12H Ward, Kristen N, DO   500 mg  at 02/16/21 1027  . metFORMIN (GLUCOPHAGE) tablet 1,000 mg  1,000 mg Oral BID WC Ward, Layla Maw, DO  1,000 mg at 02/16/21 1026  . OLANZapine (ZYPREXA) tablet 5 mg  5 mg Oral QHS Ward, Kristen N, DO   5 mg at 02/15/21 2108  . thiamine tablet 100 mg  100 mg Oral Daily Ward, Kristen N, DO   100 mg at 02/16/21 1027   Or  . thiamine (B-1) injection 100 mg  100 mg Intravenous Daily Ward, Kristen N, DO       Current Outpatient Medications  Medication Sig Dispense Refill  . amLODipine (NORVASC) 10 MG tablet Take 1 tablet by mouth daily. (Patient not taking: Reported on 02/12/2021)    . divalproex (DEPAKOTE) 500 MG DR tablet Take 1 tablet (500 mg total) by mouth every 12 (twelve) hours. (Patient not taking: Reported on 02/12/2021) 60 tablet 1  . ibuprofen (ADVIL) 600 MG tablet Take 1 tablet (600 mg total) by mouth in the morning and at bedtime. (Patient not taking: No sig reported) 30 tablet 1  . metFORMIN (GLUCOPHAGE) 1000 MG tablet Take 1 tablet (1,000 mg total) by mouth 2 (two) times daily with a meal. (Patient not taking: Reported on 02/12/2021) 60 tablet 1  . OLANZapine (ZYPREXA) 5 MG tablet Take 1 tablet (5 mg total) by mouth at bedtime. (Patient not taking: Reported on 02/12/2021) 30 tablet 1  . polyethylene glycol (MIRALAX / GLYCOLAX) 17 g packet Take 17 g by mouth daily. (Patient not taking: No sig reported) 14 each 1    Musculoskeletal: Strength & Muscle Tone: within normal limits Gait & Station: normal Patient leans: N/A            Psychiatric Specialty Exam:  Presentation  General Appearance: No data recorded Eye Contact:No data recorded Speech:No data recorded Speech Volume:No data recorded Handedness:No data recorded  Mood and Affect  Mood:No data recorded Affect:No data recorded  Thought Process  Thought Processes:No data recorded Descriptions of Associations:No data recorded Orientation:No data recorded Thought Content:No data recorded History of  Schizophrenia/Schizoaffective disorder:No data recorded Duration of Psychotic Symptoms:No data recorded Hallucinations:No data recorded Ideas of Reference:No data recorded Suicidal Thoughts:No data recorded Homicidal Thoughts:No data recorded  Sensorium  Memory:No data recorded Judgment:No data recorded Insight:No data recorded  Executive Functions  Concentration:No data recorded Attention Span:No data recorded Recall:No data recorded Fund of Knowledge:No data recorded Language:No data recorded  Psychomotor Activity  Psychomotor Activity:No data recorded  Assets  Assets:No data recorded  Sleep  Sleep:No data recorded  Physical Exam: Physical Exam Vitals and nursing note reviewed.  Constitutional:      Appearance: Normal appearance. He is ill-appearing.  HENT:     Head: Normocephalic and atraumatic.     Mouth/Throat:     Pharynx: Oropharynx is clear.  Eyes:     Pupils: Pupils are equal, round, and reactive to light.  Cardiovascular:     Rate and Rhythm: Normal rate and regular rhythm.  Pulmonary:     Effort: Pulmonary effort is normal.     Breath sounds: Normal breath sounds.  Abdominal:     General: Abdomen is flat.     Palpations: Abdomen is soft.  Musculoskeletal:        General: Normal range of motion.  Skin:    General: Skin is warm and dry.  Neurological:     General: No focal deficit present.     Mental Status: He is alert. Mental status is at baseline.  Psychiatric:        Attention and Perception: He is inattentive.        Mood and  Affect: Mood normal. Affect is angry and inappropriate.        Speech: Speech is tangential.        Behavior: Behavior is agitated.        Thought Content: Thought content is delusional.        Cognition and Memory: Cognition is impaired.        Judgment: Judgment is impulsive.    Review of Systems  Constitutional: Negative.   HENT: Negative.   Eyes: Negative.   Respiratory: Negative.   Cardiovascular: Negative.    Gastrointestinal: Negative.   Musculoskeletal: Negative.   Skin: Negative.   Neurological: Negative.   Psychiatric/Behavioral: Negative for depression. The patient is nervous/anxious.    Blood pressure 132/63, pulse 92, temperature 98.8 F (37.1 C), temperature source Oral, resp. rate 17, SpO2 97 %. There is no height or weight on file to calculate BMI.  Treatment Plan Summary: Plan Elderly man with very poor insight poor ability at self-care who remains psychotic delusional agitated hostile to staff much of the time.  Depakote level will be checked tomorrow.  It still seems impossible to imagine simply letting him walk out in his current condition with his ongoing psychosis.  Continue referral to geriatric psychiatry facilities.  Disposition: Recommend psychiatric Inpatient admission when medically cleared.  Mordecai Rasmussen, MD 02/16/2021 4:28 PM

## 2021-02-16 NOTE — ED Notes (Signed)
Pt noted to be laying across bed with feet hanging off side. This RN and ED tech attempted to move patient to properly lay in bed and pt stated "you're not moving me, I am comfy." RN educated pt that he might fall laying like that and pt stated, "I am not going to fall, don't touch me."

## 2021-02-16 NOTE — ED Notes (Signed)
Pt resting comfortably, continues to lays sideways on bed.

## 2021-02-16 NOTE — ED Notes (Signed)
Pt found ambulating in the hall with [facility].  Staff assisted pt and gave him a walker.  Pt voided in restroom.  New brief and shorter, clean scrub pants put on patient. Pt ambulated with walker back to his room and is sitting in recliner.

## 2021-02-16 NOTE — ED Notes (Signed)
Pt had accident in his brief.  Pt able to wipe himself with stand by assistance. Clean brief placed on patient.

## 2021-02-16 NOTE — ED Notes (Signed)
Pt refused his metformin. "I don't need that."

## 2021-02-16 NOTE — ED Notes (Signed)
Pt stated he needed to have a BM.  Pt used walker and had stand by assistance to the bathroom.

## 2021-02-16 NOTE — BH Assessment (Addendum)
ReferralChecks:   Luis Cooper ([phone]---[phone])No answer   [name] [phone] or [phone] reports no intake nurse overnight and to contact back after 7:30am

## 2021-02-16 NOTE — ED Notes (Signed)
Tried to change Pt's brief but Pt refused.

## 2021-02-16 NOTE — ED Notes (Signed)
Patient IVC/ Pending Placement  

## 2021-02-17 LAB — VALPROIC ACID LEVEL: Valproic Acid Lvl: 50 ug/mL (ref 50.0–100.0)

## 2021-02-17 MED ORDER — BACITRACIN-NEOMYCIN-POLYMYXIN OINTMENT TUBE
TOPICAL_OINTMENT | Freq: Two times a day (BID) | CUTANEOUS | Status: DC
  Administered 2021-02-17 – 2021-02-18 (×2): 1 via TOPICAL
  Filled 2021-02-17: qty 14.17

## 2021-02-17 NOTE — BH Assessment (Signed)
Referral checks:    Earlene Plater ([phone]) left voicemail     [facility] (Fax#) [phone]) Marcelino Duster agree to call this Clinical research associate back; however Clinical research associate never received phone call.    [facility] (Fax#) [phone] No answer   [name] [phone] or [phone]) Lauren agreed to call back; however writer never received phone call.     [facility] (Fax#) [phone]- no answer/ability to leave a voicemail.     [name] [phone] -or- [phone] Per Durenda Age, pt denied due to acuity.    [facility] (Fax#) [phone]  No answer/ability to leave voicemail.

## 2021-02-17 NOTE — ED Notes (Signed)
meds given.   Pt in recliner watching tv.

## 2021-02-17 NOTE — ED Notes (Signed)
Lunch tray given. 

## 2021-02-17 NOTE — ED Notes (Signed)
Pt placed in new depends and new linens.

## 2021-02-17 NOTE — ED Notes (Signed)
Pt ambulated with walker to recliner. Pt provided with blanket and resting comfortably in recliner at this time.

## 2021-02-17 NOTE — ED Provider Notes (Signed)
Emergency Medicine Observation Re-evaluation Note  Luis Cooper is a 80 y.o. male, seen on rounds today.  Pt initially presented to the ED for complaints of IVC Currently, the patient is resting, voices no medical complaint.  Physical Exam  BP 132/73 (BP Location: Right Arm)   Pulse (!) 101   Temp 98.6 F (37 C) (Oral)   Resp 18   SpO2 94%  Physical Exam General: Resting in no acute distress Cardiac: No cyanosis Lungs: Equal rise and fall Psych: Not agitated  ED Course / MDM  EKG:   I have reviewed the labs performed to date as well as medications administered while in observation.  Recent changes in the last 24 hours include no events overnight.  Plan  Current plan is for psychiatric disposition. Patient is under full IVC at this time.   Luis Hong, MD 02/17/21 [phone]

## 2021-02-17 NOTE — ED Notes (Signed)
Pt in bed. Pt awake and alert.

## 2021-02-17 NOTE — BH Assessment (Signed)
Adult/GERO MH  Referral information for Psychiatric Hospitalization faxed to:   [facility] (Mary-[phone])   [facility] (Fax#) [phone]   [facility] (Fax#) [phone]   [facility] ([phone] or [phone])     [facility] (Fax#) [phone]  . [facility] ([phone] -or- [phone]     [facility] (Fax#) [phone] 

## 2021-02-17 NOTE — ED Notes (Signed)
Tech gave pt decaf coffee this morning. Security standing outside door heard pt yell out.  Pt had fallen asleep and spilt the coffee onto his chest and stomach. Red noted on skin. Two small spots in the center of chest that look like could blister. Pt said "it was my fault, I fell asleep" Dr. Katrinka Blazing informed of the injury and pt told if it became painful we could get him some cream.

## 2021-02-17 NOTE — ED Notes (Signed)
Resumed care from Tyiana Hill b rn.  Pt sitting in recliner watching tv.  Pt calm and cooperative

## 2021-02-18 NOTE — ED Notes (Signed)
Pt given lunch

## 2021-02-18 NOTE — ED Notes (Signed)
Provided breakfast; pt still resting. Equal rise and fall of chest.

## 2021-02-18 NOTE — ED Notes (Signed)
IVC/Pending Placement 

## 2021-02-18 NOTE — ED Notes (Signed)
Pt provided new linens and new brief. Sitting in recliner eating breakfast. Cooperative and pleasant.

## 2021-02-18 NOTE — ED Notes (Signed)
Pt asleep. Vs will be assess when pt is awake.

## 2021-02-18 NOTE — ED Notes (Signed)
Psych at bedside.

## 2021-02-18 NOTE — ED Notes (Signed)
Pt sleeping. 

## 2021-02-18 NOTE — ED Notes (Signed)
Given dinner

## 2021-02-18 NOTE — BH Assessment (Signed)
Writer followed up with referrals.   [phone]), asked to refax   [facility](Fax#) [phone]),   [facility] (Fax#) [phone], declined due to state hospital, not in catchment area.   [facility]([phone] or [phone]), asked to refaxed the referral.   [facility] (Fax#) [phone]- no answer/ability to leave a voicemail.     [facility]([phone] -or- [phone] Per Durenda Age, pt denied due to medical problems.   [facility](Fax#) [phone]  No answer/ability to leave voicemail.    Referral information for Psychiatric Hospitalization faxed to;   [name] (-[phone] -or- [phone]) [phone]  . [facility] [phone]),   . [facility] [phone]).

## 2021-02-18 NOTE — Consult Note (Signed)
[facility] Face-to-Face Psychiatry Consult   Reason for Consult: Follow-up consult 80 year old man with bipolar disorder Referring Physician: Su Hoff Patient Identification: Johnanthony Wilden MRN:  [id number] Principal Diagnosis: Manic bipolar I disorder (HCC) Diagnosis:  Principal Problem:   Manic bipolar I disorder (HCC) Active Problems:   Diabetes mellitus without complication (HCC)   Gout   Hypertension   Total Time spent with patient: 30 minutes  Subjective:   Aurther Harlin is a 80 y.o. male patient admitted with "now I am glad I came here".  HPI: Patient seen for follow-up.  He tells me that now he is glad he came here.  He says he realizes that he was out of line and too agitated when he came to the hospital.  However he is now still requesting discharge saying that he is happy to go back to his life living outdoors.  Patient was lucid.  Did not appear to be delusional.  No hostile or threatening behavior.  No suicidal ideation.  States that he would be compliant with medicine  Past Psychiatric History: Long history of bipolar disorder  Risk to Self:   Risk to Others:   Prior Inpatient Therapy:   Prior Outpatient Therapy:    Past Medical History:  Past Medical History:  Diagnosis Date  . Bipolar 1 disorder (HCC)   . Diabetes mellitus without complication (HCC)   . Gout    History reviewed. No pertinent surgical history. Family History: No family history on file. Family Psychiatric  History: See previous Social History:  Social History   Substance and Sexual Activity  Alcohol Use Yes   Comment: today     Social History   Substance and Sexual Activity  Drug Use Never    Social History   Socioeconomic History  . Marital status: Single    Spouse name: Not on file  . Number of children: Not on file  . Years of education: Not on file  . Highest education level: Not on file  Occupational History  . Not on file  Tobacco Use  . Smoking status: Current Every Day  Smoker    Packs/day: 0.50    Types: Cigarettes  . Smokeless tobacco: Never Used  Vaping Use  . Vaping Use: Never used  Substance and Sexual Activity  . Alcohol use: Yes    Comment: today  . Drug use: Never  . Sexual activity: Not on file  Other Topics Concern  . Not on file  Social History Narrative  . Not on file   Social Determinants of Health   Financial Resource Strain: Not on file  Food Insecurity: Not on file  Transportation Needs: Not on file  Physical Activity: Not on file  Stress: Not on file  Social Connections: Not on file   Additional Social History:    Allergies:  No Known Allergies  Labs:  Results for orders placed or performed during the hospital encounter of 02/10/21 (from the past 48 hour(s))  Valproic acid level     Status: None   Collection Time: 02/17/21  4:43 AM  Result Value Ref Range   Valproic Acid Lvl 50 50.0 - 100.0 ug/mL    Comment: Performed at [facility], [address] [place], [place] [address]    Current Facility-Administered Medications  Medication Dose Route Frequency Provider Last Rate Last Admin  . amLODipine (NORVASC) tablet 5 mg  5 mg Oral Daily Ward, Kristen N, DO   5 mg at 02/18/21 1044  . divalproex (DEPAKOTE) DR tablet  500 mg  500 mg Oral Q12H Ward, Kristen N, DO   500 mg at 02/18/21 1043  . metFORMIN (GLUCOPHAGE) tablet 1,000 mg  1,000 mg Oral BID WC Ward, Kristen N, DO   1,000 mg at 02/18/21 1611  . neomycin-bacitracin-polymyxin (NEOSPORIN) ointment   Topical BID Merwyn Katos, MD   1 application at 02/18/21 1043  . OLANZapine (ZYPREXA) tablet 5 mg  5 mg Oral QHS Ward, Kristen N, DO   5 mg at 02/17/21 2123  . thiamine tablet 100 mg  100 mg Oral Daily Ward, Kristen N, DO   100 mg at 02/18/21 1043   Or  . thiamine (B-1) injection 100 mg  100 mg Intravenous Daily Ward, Kristen N, DO       Current Outpatient Medications  Medication Sig Dispense Refill  . amLODipine (NORVASC) 10 MG tablet Take 1 tablet by mouth  daily. (Patient not taking: Reported on 02/12/2021)    . divalproex (DEPAKOTE) 500 MG DR tablet Take 1 tablet (500 mg total) by mouth every 12 (twelve) hours. (Patient not taking: Reported on 02/12/2021) 60 tablet 1  . ibuprofen (ADVIL) 600 MG tablet Take 1 tablet (600 mg total) by mouth in the morning and at bedtime. (Patient not taking: No sig reported) 30 tablet 1  . metFORMIN (GLUCOPHAGE) 1000 MG tablet Take 1 tablet (1,000 mg total) by mouth 2 (two) times daily with a meal. (Patient not taking: Reported on 02/12/2021) 60 tablet 1  . OLANZapine (ZYPREXA) 5 MG tablet Take 1 tablet (5 mg total) by mouth at bedtime. (Patient not taking: Reported on 02/12/2021) 30 tablet 1  . polyethylene glycol (MIRALAX / GLYCOLAX) 17 g packet Take 17 g by mouth daily. (Patient not taking: No sig reported) 14 each 1    Musculoskeletal: Strength & Muscle Tone: within normal limits Gait & Station: unsteady Patient leans: N/A            Psychiatric Specialty Exam:  Presentation  General Appearance: No data recorded Eye Contact:No data recorded Speech:No data recorded Speech Volume:No data recorded Handedness:No data recorded  Mood and Affect  Mood:No data recorded Affect:No data recorded  Thought Process  Thought Processes:No data recorded Descriptions of Associations:No data recorded Orientation:No data recorded Thought Content:No data recorded History of Schizophrenia/Schizoaffective disorder:No data recorded Duration of Psychotic Symptoms:No data recorded Hallucinations:No data recorded Ideas of Reference:No data recorded Suicidal Thoughts:No data recorded Homicidal Thoughts:No data recorded  Sensorium  Memory:No data recorded Judgment:No data recorded Insight:No data recorded  Executive Functions  Concentration:No data recorded Attention Span:No data recorded Recall:No data recorded Fund of Knowledge:No data recorded Language:No data recorded  Psychomotor Activity  Psychomotor  Activity:No data recorded  Assets  Assets:No data recorded  Sleep  Sleep:No data recorded  Physical Exam: Physical Exam Constitutional:      Appearance: Normal appearance.  HENT:     Head: Normocephalic and atraumatic.     Mouth/Throat:     Pharynx: Oropharynx is clear.  Eyes:     Pupils: Pupils are equal, round, and reactive to light.  Cardiovascular:     Rate and Rhythm: Normal rate and regular rhythm.  Pulmonary:     Effort: Pulmonary effort is normal.     Breath sounds: Normal breath sounds.  Abdominal:     General: Abdomen is flat.     Palpations: Abdomen is soft.  Musculoskeletal:        General: Normal range of motion.  Skin:    General: Skin is warm and dry.  Neurological:     General: No focal deficit present.     Mental Status: He is alert. Mental status is at baseline.  Psychiatric:        Mood and Affect: Mood normal.        Thought Content: Thought content normal.    Review of Systems  Constitutional: Negative.   HENT: Negative.   Eyes: Negative.   Respiratory: Negative.   Cardiovascular: Negative.   Gastrointestinal: Negative.   Musculoskeletal: Negative.   Skin: Negative.   Neurological: Negative.   Psychiatric/Behavioral: Negative.    Blood pressure (!) 152/93, pulse 74, temperature 98.1 F (36.7 C), temperature source Oral, resp. rate 18, SpO2 99 %. There is no height or weight on file to calculate BMI.  Treatment Plan Summary: Medication management and Plan We have had no success at all getting him admitted to a geriatric psychiatry ward.  At this point he seems to have improved enough that it is probably reasonable to just discharge him.  At this time of the evening it is not realistic.  I told him I will speak to him again in the morning and we will consider discharge at that time.  Disposition: Supportive therapy provided about ongoing stressors. Discussed crisis plan, support from social network, calling 911, coming to the Emergency  Department, and calling Suicide Hotline.  Mordecai Rasmussen, MD 02/18/2021 5:34 PM

## 2021-02-18 NOTE — ED Notes (Signed)
Pt awake

## 2021-02-18 NOTE — ED Notes (Signed)
Pt friend, Genevie Cheshire phone number [phone]

## 2021-02-18 NOTE — ED Provider Notes (Signed)
Emergency Medicine Observation Re-evaluation Note  Luis Cooper is a 80 y.o. male, seen on rounds today.  Pt initially presented to the ED for complaints of IVC Currently, the patient is resting, voices no medical complaint.  Physical Exam  BP 112/68 (BP Location: Right Arm)   Pulse 92   Temp 97.6 F (36.4 C) (Oral)   Resp 18   SpO2 98%  Physical Exam General: Resting in no acute distress Cardiac: No cyanosis Lungs: Equal rise and fall Psych: Not agitated  ED Course / MDM  EKG:   I have reviewed the labs performed to date as well as medications administered while in observation.  Recent changes in the last 24 hours include no events overnight.  Plan  Current plan is for psychiatric disposition; patient has been referred to multiple facilities. Patient is under full IVC at this time.   Irean Hong, MD 02/18/21 [phone]

## 2021-02-19 MED ORDER — DIVALPROEX SODIUM 500 MG PO DR TAB: 500.0000 mg | DELAYED_RELEASE_TABLET | Freq: Two times a day (BID) | ORAL | 1 refills | Status: AC

## 2021-02-19 MED ORDER — AMLODIPINE BESYLATE 5 MG PO TABS: 5.0000 mg | ORAL_TABLET | Freq: Every day | ORAL | 1 refills | Status: AC

## 2021-02-19 MED ORDER — OLANZAPINE 5 MG PO TABS: 5.0000 mg | ORAL_TABLET | Freq: Every day | ORAL | 1 refills | Status: AC

## 2021-02-19 MED ORDER — METFORMIN HCL 1000 MG PO TABS: 1000.0000 mg | ORAL_TABLET | Freq: Two times a day (BID) | ORAL | 1 refills | Status: AC

## 2021-02-19 NOTE — ED Notes (Signed)
Ambulates safely with walker, new walker provided to patient. Pt agreeable with disposition. Pt states he is getting on city bus and will meet up with friends. Pt alert and oriented X4, cooperative, RR even and unlabored, color WNL. Pt in NAD.

## 2021-02-19 NOTE — ED Notes (Signed)
IVC, pending possible discharge after AM reassesment

## 2021-02-19 NOTE — ED Notes (Signed)
Given  breakfast

## 2021-02-19 NOTE — ED Notes (Signed)
Refused to sign discharge e signature.

## 2021-02-19 NOTE — Consult Note (Signed)
[facility] Face-to-Face Psychiatry Consult follow-up   Reason for Consult: Follow-up patient with bipolar disorder Referring Physician: Roxan Hockey Patient Identification: Luis Cooper MRN:  [id number] Principal Diagnosis: Manic bipolar I disorder (HCC) Diagnosis:  Principal Problem:   Manic bipolar I disorder (HCC) Active Problems:   Diabetes mellitus without complication (HCC)   Gout   Hypertension   Total Time spent with patient: 30 minutes  Subjective:   Luis Cooper is a 80 y.o. male patient admitted with "I am feeling fine.".  HPI: Consistent with what I observed yesterday the patient continues to be improved.  Affect is calm.  No inappropriate behavior.  Evidence of lucid thinking.  Improved insight.  Able to articulate a plan for self-care.  Agrees in principle to taking his medicine and says he will try and get them filled as soon as possible.  Denies suicidal ideation.  Not acting out aggressively or against himself.  Not acutely psychotic  Past Psychiatric History: Past history of bipolar  Risk to Self:   Risk to Others:   Prior Inpatient Therapy:   Prior Outpatient Therapy:    Past Medical History:  Past Medical History:  Diagnosis Date  . Bipolar 1 disorder (HCC)   . Diabetes mellitus without complication (HCC)   . Gout    History reviewed. No pertinent surgical history. Family History: No family history on file. Family Psychiatric  History: See previous Social History:  Social History   Substance and Sexual Activity  Alcohol Use Yes   Comment: today     Social History   Substance and Sexual Activity  Drug Use Never    Social History   Socioeconomic History  . Marital status: Single    Spouse name: Not on file  . Number of children: Not on file  . Years of education: Not on file  . Highest education level: Not on file  Occupational History  . Not on file  Tobacco Use  . Smoking status: Current Every Day Smoker    Packs/day: 0.50    Types:  Cigarettes  . Smokeless tobacco: Never Used  Vaping Use  . Vaping Use: Never used  Substance and Sexual Activity  . Alcohol use: Yes    Comment: today  . Drug use: Never  . Sexual activity: Not on file  Other Topics Concern  . Not on file  Social History Narrative  . Not on file   Social Determinants of Health   Financial Resource Strain: Not on file  Food Insecurity: Not on file  Transportation Needs: Not on file  Physical Activity: Not on file  Stress: Not on file  Social Connections: Not on file   Additional Social History:    Allergies:  No Known Allergies  Labs: No results found for this or any previous visit (from the past 48 hour(s)).  Current Facility-Administered Medications  Medication Dose Route Frequency Provider Last Rate Last Admin  . amLODipine (NORVASC) tablet 5 mg  5 mg Oral Daily Ward, Kristen N, DO   5 mg at 02/19/21 0952  . divalproex (DEPAKOTE) DR tablet 500 mg  500 mg Oral Q12H Ward, Kristen N, DO   500 mg at 02/19/21 0952  . metFORMIN (GLUCOPHAGE) tablet 1,000 mg  1,000 mg Oral BID WC Ward, Kristen N, DO   1,000 mg at 02/19/21 0952  . neomycin-bacitracin-polymyxin (NEOSPORIN) ointment   Topical BID Merwyn Katos, MD   Given at 02/19/21 [phone]  . OLANZapine (ZYPREXA) tablet 5 mg  5 mg Oral QHS Ward,  Kristen N, DO   5 mg at 02/18/21 2146  . thiamine tablet 100 mg  100 mg Oral Daily Ward, Kristen N, DO   100 mg at 02/19/21 3646   Or  . thiamine (B-1) injection 100 mg  100 mg Intravenous Daily Ward, Kristen N, DO       Current Outpatient Medications  Medication Sig Dispense Refill  . amLODipine (NORVASC) 5 MG tablet Take 1 tablet (5 mg total) by mouth daily. 30 tablet 1  . divalproex (DEPAKOTE) 500 MG DR tablet Take 1 tablet (500 mg total) by mouth every 12 (twelve) hours. 60 tablet 1  . ibuprofen (ADVIL) 600 MG tablet Take 1 tablet (600 mg total) by mouth in the morning and at bedtime. (Patient not taking: No sig reported) 30 tablet 1  . metFORMIN  (GLUCOPHAGE) 1000 MG tablet Take 1 tablet (1,000 mg total) by mouth 2 (two) times daily with a meal. 60 tablet 1  . OLANZapine (ZYPREXA) 5 MG tablet Take 1 tablet (5 mg total) by mouth at bedtime. 30 tablet 1  . polyethylene glycol (MIRALAX / GLYCOLAX) 17 g packet Take 17 g by mouth daily. (Patient not taking: No sig reported) 14 each 1    Musculoskeletal: Strength & Muscle Tone: within normal limits Gait & Station: normal Patient leans: N/A            Psychiatric Specialty Exam:  Presentation  General Appearance: No data recorded Eye Contact:No data recorded Speech:No data recorded Speech Volume:No data recorded Handedness:No data recorded  Mood and Affect  Mood:No data recorded Affect:No data recorded  Thought Process  Thought Processes:No data recorded Descriptions of Associations:No data recorded Orientation:No data recorded Thought Content:No data recorded History of Schizophrenia/Schizoaffective disorder:No data recorded Duration of Psychotic Symptoms:No data recorded Hallucinations:No data recorded Ideas of Reference:No data recorded Suicidal Thoughts:No data recorded Homicidal Thoughts:No data recorded  Sensorium  Memory:No data recorded Judgment:No data recorded Insight:No data recorded  Executive Functions  Concentration:No data recorded Attention Span:No data recorded Recall:No data recorded Fund of Knowledge:No data recorded Language:No data recorded  Psychomotor Activity  Psychomotor Activity:No data recorded  Assets  Assets:No data recorded  Sleep  Sleep:No data recorded  Physical Exam: Physical Exam Vitals and nursing note reviewed.  Constitutional:      Appearance: Normal appearance.  HENT:     Head: Normocephalic and atraumatic.     Mouth/Throat:     Pharynx: Oropharynx is clear.  Eyes:     Pupils: Pupils are equal, round, and reactive to light.  Cardiovascular:     Rate and Rhythm: Normal rate and regular rhythm.   Pulmonary:     Effort: Pulmonary effort is normal.     Breath sounds: Normal breath sounds.  Abdominal:     General: Abdomen is flat.     Palpations: Abdomen is soft.  Musculoskeletal:        General: Normal range of motion.  Skin:    General: Skin is warm and dry.  Neurological:     General: No focal deficit present.     Mental Status: He is alert. Mental status is at baseline.  Psychiatric:        Attention and Perception: Attention normal.        Mood and Affect: Mood normal.        Speech: Speech normal.        Behavior: Behavior normal.        Thought Content: Thought content normal.  Cognition and Memory: Cognition normal.        Judgment: Judgment normal.    Review of Systems  Constitutional: Negative.   HENT: Negative.   Eyes: Negative.   Respiratory: Negative.   Cardiovascular: Negative.   Gastrointestinal: Negative.   Musculoskeletal: Negative.   Skin: Negative.   Neurological: Negative.   Psychiatric/Behavioral: Negative.    Blood pressure 118/65, pulse (!) 101, temperature 98.1 F (36.7 C), temperature source Oral, resp. rate 18, SpO2 98 %. There is no height or weight on file to calculate BMI.  Treatment Plan Summary: Medication management and Plan Patient does not appear to be psychotic.  Affect and mood have calm down.  Sleeping better.  Tolerating medicine.  Patient continues to state that he prefers to live outdoors essentially as something of a hobo.  He says he manages his monthly check fine.  Does not appear to be grossly irrational.  He understands the inherent risks in his lifestyle.  Patient at this point no longer meets commitment criteria.  I have discontinued IVC.  Printed out prescriptions for his medicine.  Case reviewed with emergency room doctor.  Recommend discharge to outpatient treatment in the community particularly with [facility]  Disposition: Patient does not meet criteria for psychiatric inpatient admission. Supportive therapy provided  about ongoing stressors. Discussed crisis plan, support from social network, calling 911, coming to the Emergency Department, and calling Suicide Hotline.  Mordecai Rasmussen, MD 02/19/2021 11:55 AM

## 2021-02-19 NOTE — ED Provider Notes (Signed)
The patient has been evaluated at bedside by Dr. Toni Amend, psychiatry.  Patient is clinically stable.  Not felt to be a danger to self or others.  No SI or Hi.  No indication for inpatient psychiatric admission at this time.  Appropriate for continued outpatient therapy.    Willy Eddy, MD 02/19/21 [phone]

## 2021-02-25 ENCOUNTER — Emergency Department: Payer: Medicare Other

## 2021-02-25 ENCOUNTER — Other Ambulatory Visit: Payer: Self-pay

## 2021-02-25 ENCOUNTER — Emergency Department
Admission: EM | Admit: 2021-02-25 | Discharge: 2021-03-11 | Disposition: E | Payer: Medicare Other | Attending: Emergency Medicine | Admitting: Emergency Medicine

## 2021-02-25 DIAGNOSIS — J690 Pneumonitis due to inhalation of food and vomit: Secondary | ICD-10-CM | POA: Insufficient documentation

## 2021-02-25 DIAGNOSIS — E119 Type 2 diabetes mellitus without complications: Secondary | ICD-10-CM | POA: Diagnosis not present

## 2021-02-25 DIAGNOSIS — Z20822 Contact with and (suspected) exposure to covid-19: Secondary | ICD-10-CM | POA: Diagnosis not present

## 2021-02-25 DIAGNOSIS — Z7984 Long term (current) use of oral hypoglycemic drugs: Secondary | ICD-10-CM | POA: Diagnosis not present

## 2021-02-25 DIAGNOSIS — Z79899 Other long term (current) drug therapy: Secondary | ICD-10-CM | POA: Insufficient documentation

## 2021-02-25 DIAGNOSIS — R42 Dizziness and giddiness: Secondary | ICD-10-CM | POA: Diagnosis present

## 2021-02-25 DIAGNOSIS — F17210 Nicotine dependence, cigarettes, uncomplicated: Secondary | ICD-10-CM | POA: Diagnosis not present

## 2021-02-25 DIAGNOSIS — R451 Restlessness and agitation: Secondary | ICD-10-CM | POA: Insufficient documentation

## 2021-02-25 DIAGNOSIS — I10 Essential (primary) hypertension: Secondary | ICD-10-CM | POA: Diagnosis not present

## 2021-02-25 DIAGNOSIS — I469 Cardiac arrest, cause unspecified: Secondary | ICD-10-CM | POA: Insufficient documentation

## 2021-02-25 LAB — CBC
HCT: 32.9 % — ABNORMAL LOW (ref 39.0–52.0)
Hemoglobin: 11.1 g/dL — ABNORMAL LOW (ref 13.0–17.0)
MCH: 30.5 pg (ref 26.0–34.0)
MCHC: 33.7 g/dL (ref 30.0–36.0)
MCV: 90.4 fL (ref 80.0–100.0)
Platelets: 196 10*3/uL (ref 150–400)
RBC: 3.64 MIL/uL — ABNORMAL LOW (ref 4.22–5.81)
RDW: 13.2 % (ref 11.5–15.5)
WBC: 7.4 10*3/uL (ref 4.0–10.5)
nRBC: 0 % (ref 0.0–0.2)

## 2021-02-25 LAB — BASIC METABOLIC PANEL
Anion gap: 9 (ref 5–15)
BUN: 27 mg/dL — ABNORMAL HIGH (ref 8–23)
CO2: 21 mmol/L — ABNORMAL LOW (ref 22–32)
Calcium: 8.6 mg/dL — ABNORMAL LOW (ref 8.9–10.3)
Chloride: 108 mmol/L (ref 98–111)
Creatinine, Ser: 1.46 mg/dL — ABNORMAL HIGH (ref 0.61–1.24)
GFR, Estimated: 49 mL/min — ABNORMAL LOW (ref >60)
Glucose, Bld: 196 mg/dL — ABNORMAL HIGH (ref 70–99)
Potassium: 3.8 mmol/L (ref 3.5–5.1)
Sodium: 138 mmol/L (ref 135–145)

## 2021-02-25 LAB — HEPATIC FUNCTION PANEL
ALT: 67 U/L — ABNORMAL HIGH (ref 0–44)
AST: 67 U/L — ABNORMAL HIGH (ref 15–41)
Albumin: 2.9 g/dL — ABNORMAL LOW (ref 3.5–5.0)
Alkaline Phosphatase: 108 U/L (ref 38–126)
Bilirubin, Direct: 0.2 mg/dL (ref 0.0–0.2)
Indirect Bilirubin: 0.7 mg/dL (ref 0.3–0.9)
Total Bilirubin: 0.9 mg/dL (ref 0.3–1.2)
Total Protein: 6.4 g/dL — ABNORMAL LOW (ref 6.5–8.1)

## 2021-02-25 LAB — ETHANOL: Alcohol, Ethyl (B): <10 mg/dL (ref <10)

## 2021-02-25 LAB — RESP PANEL BY RT-PCR (FLU A&B, COVID) ARPGX2
Influenza A by PCR: NEGATIVE
Influenza B by PCR: NEGATIVE
SARS Coronavirus 2 by RT PCR: NEGATIVE

## 2021-02-25 LAB — TROPONIN I (HIGH SENSITIVITY)
Troponin I (High Sensitivity): 52 ng/L — ABNORMAL HIGH (ref <18)
Troponin I (High Sensitivity): 68 ng/L — ABNORMAL HIGH (ref <18)

## 2021-02-25 MED ORDER — THIAMINE HCL 100 MG/ML IJ SOLN: 100.0000 mg | Freq: Every day | INTRAMUSCULAR | Status: DC

## 2021-02-25 MED ORDER — THIAMINE HCL 100 MG PO TABS: 100.0000 mg | ORAL_TABLET | Freq: Every day | ORAL | Status: DC

## 2021-02-25 MED ORDER — LORAZEPAM 2 MG PO TABS: 0.0000 mg | ORAL_TABLET | Freq: Four times a day (QID) | ORAL | Status: DC

## 2021-02-25 MED ORDER — ATROPINE SULFATE 1 MG/ML IJ SOLN
INTRAMUSCULAR | Status: AC | PRN
  Administered 2021-02-25: 1 mg via INTRAVENOUS

## 2021-02-25 MED ORDER — SODIUM CHLORIDE 0.9 % IV BOLUS
1000.0000 mL | Freq: Once | INTRAVENOUS | Status: AC
  Administered 2021-02-25: 1000 mL via INTRAVENOUS

## 2021-02-25 MED ORDER — LORAZEPAM 2 MG/ML IJ SOLN: 0.0000 mg | Freq: Two times a day (BID) | INTRAMUSCULAR | Status: DC

## 2021-02-25 MED ORDER — LORAZEPAM 2 MG PO TABS: 0.0000 mg | ORAL_TABLET | Freq: Two times a day (BID) | ORAL | Status: DC

## 2021-02-25 MED ORDER — THIAMINE HCL 100 MG/ML IJ SOLN
100.0000 mg | Freq: Once | INTRAMUSCULAR | Status: AC
  Administered 2021-02-25: 100 mg via INTRAVENOUS
  Filled 2021-02-25: qty 2

## 2021-02-25 MED ORDER — LACTATED RINGERS IV BOLUS: 1000.0000 mL | Freq: Once | INTRAVENOUS | Status: DC

## 2021-02-25 MED ORDER — EPINEPHRINE 1 MG/10ML IJ SOSY
PREFILLED_SYRINGE | INTRAMUSCULAR | Status: AC | PRN
  Administered 2021-02-25 (×7): 1 mg via INTRAVENOUS

## 2021-02-25 MED ORDER — LORAZEPAM 2 MG/ML IJ SOLN
0.0000 mg | Freq: Four times a day (QID) | INTRAMUSCULAR | Status: DC
  Administered 2021-02-25: 2 mg via INTRAVENOUS
  Filled 2021-02-25: qty 1

## 2021-02-25 MED ORDER — SODIUM BICARBONATE 8.4 % IV SOLN
INTRAVENOUS | Status: AC | PRN
  Administered 2021-02-25 (×2): 50 meq via INTRAVENOUS

## 2021-02-26 MED FILL — Medication: Qty: 1 | Status: AC

## 2021-03-11 DIAGNOSIS — 419620001 Death: Secondary | SNOMED CT | POA: Insufficient documentation

## 2021-03-11 NOTE — ED Provider Notes (Signed)
[facility] Emergency Department Provider Note  ____________________________________________   Event Date/Time   First MD Initiated Contact with Patient 03-15-2021 1753     (approximate)  I have reviewed the triage vital signs and the nursing notes.   HISTORY  Chief Complaint stemi ems    HPI Luis Cooper is a 80 y.o. male with history of bipolar disorder, diabetes, here with weakness.  History is somewhat limited as patient is somewhat belligerent and refuses to provide much history.  Per report, he was walking on the street in the heat when he began to feel lightheaded.  He felt like he was going to pass out.  He states that he was just very "worked up" after getting into an argument with someone where he lives.  He was walking to try to clear his mind.  He is adamant he has had no chest pain.  Denies any shortness of breath.  He is somewhat agitated and tangential on thought process.  Denies any suicidal or overt homicidal ideation.  Denies any lightheadedness or dizziness.  Denies any medication changes.  No other acute complaints.        Past Medical History:  Diagnosis Date  . Bipolar 1 disorder (HCC)   . Diabetes mellitus without complication (HCC)   . Gout     Patient Active Problem List   Diagnosis Date Noted  . Manic bipolar I disorder (HCC) 08/19/2020  . Hypertension 08/08/2020  . Bipolar affective disorder (HCC) 11/02/2018  . Diabetes mellitus without complication (HCC) 09/15/2018  . Gout 09/15/2018    History reviewed. No pertinent surgical history.  Prior to Admission medications   Medication Sig Start Date End Date Taking? Authorizing Provider  amLODipine (NORVASC) 5 MG tablet Take 1 tablet (5 mg total) by mouth daily. 02/19/21   Clapacs, Jackquline DenmarkJohn T, MD  divalproex (DEPAKOTE) 500 MG DR tablet Take 1 tablet (500 mg total) by mouth every 12 (twelve) hours. 02/19/21   Clapacs, Jackquline DenmarkJohn T, MD  ibuprofen (ADVIL) 600 MG tablet Take 1 tablet  (600 mg total) by mouth in the morning and at bedtime. Patient not taking: No sig reported 04/23/20   Emily FilbertWilliams, Jonathan E, MD  metFORMIN (GLUCOPHAGE) 1000 MG tablet Take 1 tablet (1,000 mg total) by mouth 2 (two) times daily with a meal. 02/19/21   Clapacs, Jackquline DenmarkJohn T, MD  OLANZapine (ZYPREXA) 5 MG tablet Take 1 tablet (5 mg total) by mouth at bedtime. 02/19/21   Clapacs, Jackquline DenmarkJohn T, MD  polyethylene glycol (MIRALAX / GLYCOLAX) 17 g packet Take 17 g by mouth daily. Patient not taking: No sig reported 04/23/20   Emily FilbertWilliams, Jonathan E, MD    Allergies Patient has no known allergies.  History reviewed. No pertinent family history.  Social History Social History   Tobacco Use  . Smoking status: Current Every Day Smoker    Packs/day: 0.50    Types: Cigarettes  . Smokeless tobacco: Never Used  Vaping Use  . Vaping Use: Never used  Substance Use Topics  . Alcohol use: Yes    Comment: today  . Drug use: Never    Review of Systems  Review of Systems  Unable to perform ROS: Psychiatric disorder  Constitutional: Positive for fatigue. Negative for chills and fever.  HENT: Negative for sore throat.   Respiratory: Negative for shortness of breath.   Cardiovascular: Negative for chest pain.  Gastrointestinal: Negative for abdominal pain.  Genitourinary: Negative for flank pain.  Musculoskeletal: Negative for neck pain.  Skin:  Negative for rash and wound.  Allergic/Immunologic: Negative for immunocompromised state.  Neurological: Positive for weakness and light-headedness. Negative for numbness.  Hematological: Does not bruise/bleed easily.  All other systems reviewed and are negative.    ____________________________________________  PHYSICAL EXAM:      VITAL SIGNS: ED Triage Vitals [2021-03-04 1754]  Enc Vitals Group     BP      Pulse      Resp      Temp      Temp src      SpO2      Weight 150 lb (68 kg)     Height 5\' 5"  (1.651 m)     Head Circumference      Peak Flow      Pain  Score 0     Pain Loc      Pain Edu?      Excl. in GC?      Physical Exam Vitals and nursing note reviewed.  Constitutional:      General: He is not in acute distress.    Appearance: He is well-developed.  HENT:     Head: Normocephalic and atraumatic.     Mouth/Throat:     Mouth: Mucous membranes are dry.  Eyes:     Conjunctiva/sclera: Conjunctivae normal.  Cardiovascular:     Rate and Rhythm: Normal rate and regular rhythm.     Heart sounds: Normal heart sounds. No murmur heard. No friction rub.  Pulmonary:     Effort: Pulmonary effort is normal. No respiratory distress.     Breath sounds: Normal breath sounds. No wheezing or rales.  Abdominal:     General: There is no distension.     Palpations: Abdomen is soft.     Tenderness: There is no abdominal tenderness.  Musculoskeletal:     Cervical back: Neck supple.  Skin:    General: Skin is warm.     Capillary Refill: Capillary refill takes less than 2 seconds.  Neurological:     Mental Status: He is alert.     Motor: No abnormal muscle tone.  Psychiatric:        Mood and Affect: Affect is labile.        Behavior: Behavior is agitated.       ____________________________________________   LABS (all labs ordered are listed, but only abnormal results are displayed)  Labs Reviewed  BASIC METABOLIC PANEL - Abnormal; Notable for the following components:      Result Value   CO2 21 (*)    Glucose, Bld 196 (*)    BUN 27 (*)    Creatinine, Ser 1.46 (*)    Calcium 8.6 (*)    GFR, Estimated 49 (*)    All other components within normal limits  CBC - Abnormal; Notable for the following components:   RBC 3.64 (*)    Hemoglobin 11.1 (*)    HCT 32.9 (*)    All other components within normal limits  HEPATIC FUNCTION PANEL - Abnormal; Notable for the following components:   Total Protein 6.4 (*)    Albumin 2.9 (*)    AST 67 (*)    ALT 67 (*)    All other components within normal limits  TROPONIN I (HIGH SENSITIVITY) -  Abnormal; Notable for the following components:   Troponin I (High Sensitivity) 52 (*)    All other components within normal limits  TROPONIN I (HIGH SENSITIVITY) - Abnormal; Notable for the following components:   Troponin I (High  Sensitivity) 68 (*)    All other components within normal limits  RESP PANEL BY RT-PCR (FLU A&B, COVID) [id number]  ETHANOL    ____________________________________________  EKG: 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  Ventricular rate 113.  PR 145, QRS 134, QTc 505.  No acute ST elevations when compared to prior, which is similar bundle and ST elevations in the anterior precordial's with T wave inversion in 1 and aVL. ________________________________________  RADIOLOGY All imaging, including plain films, CT scans, and ultrasounds, independently reviewed by me, and interpretations confirmed via formal radiology reads.  ED MD interpretation:   Refused  Official radiology report(s): No results found.  ____________________________________________  PROCEDURES   Procedure(s) performed (including Critical Care):  .Critical Care Performed by: Shaune Pollack, MD Authorized by: Shaune Pollack, MD   Critical care provider statement:    Critical care time (minutes):  35   Critical care time was exclusive of:  Separately billable procedures and treating other patients and teaching time   Critical care was necessary to treat or prevent imminent or life-threatening deterioration of the following conditions:  Circulatory failure, cardiac failure and respiratory failure   Critical care was time spent personally by me on the following activities:  Development of treatment plan with patient or surrogate, discussions with consultants, evaluation of patient's response to treatment, examination of patient, obtaining history from patient or surrogate, ordering and performing treatments and interventions, ordering and review of laboratory studies, ordering and  review of radiographic studies, pulse oximetry, re-evaluation of patient's condition and review of old charts   I assumed direction of critical care for this patient from another provider in my specialty: no   CPR  Date/Time: 03-10-2021 11:39 PM Performed by: Shaune Pollack, MD Authorized by: Shaune Pollack, MD  CPR Procedure Details:    CPR/ACLS performed in the ED: Yes     Duration of CPR (minutes):  35   Outcome: Pt declared dead    CPR performed via ACLS guidelines under my direct supervision.  See RN documentation for details including defibrillator use, medications, doses and timing.    ____________________________________________  INITIAL IMPRESSION / MDM / ASSESSMENT AND PLAN / ED COURSE  As part of my medical decision making, I reviewed the following data within the electronic MEDICAL RECORD NUMBER Nursing notes reviewed and incorporated, Old chart reviewed, Notes from prior ED visits, and [place] Controlled Substance Database       *Luis Cooper was evaluated in Emergency Department on 2021-03-10 for the symptoms described in the history of present illness. He was evaluated in the context of the global COVID-19 pandemic, which necessitated consideration that the patient might be at risk for infection with the SARS-CoV-2 virus that causes COVID-19. Institutional protocols and algorithms that pertain to the evaluation of patients at risk for COVID-19 are in a state of rapid change based on information released by regulatory bodies including the CDC and federal and state organizations. These policies and algorithms were followed during the patient's care in the ED.  Some ED evaluations and interventions may be delayed as a result of limited staffing during the pandemic.*     Medical Decision Making: 80 year old male here with near syncopal episode.  The patient has a fairly extensive psychiatric history, somewhat limiting his history.  He was initially activated as a code STEMI, but EKG  shows left bundle branch block with no significant changes from prior.  He is adamant he has no chest pain.  Troponin minimally elevated but I suspect  this is secondary to his mild dehydration and AKI, with possible demand.  Of note, the patient states that he did get into an extreme argument and was very upset when he began to feel lightheaded today.  Query possible cardiac demand as well.  Otherwise, he is mildly tachycardic and borderline low blood pressure, which I suspect is due to poor p.o. intake.  He admits to heavy recent alcohol use with poor p.o. intake.  Patient started on fluids, thiamine, with plan to admit.  Patient also slightly tremulous and tachycardic with negative alcohol level and history of heavy abuse, will place on CIWA.  COVID-negative.  While waiting room, patient suddenly got very agitated and demanded to speak to the nurse.  While agitated, he reportedly began coughing with rhonchorous breath sounds.  He then became hypoxic and went into a PEA arrest.  CPR immediately begun.  ACLS protocol continued with epinephrine in addition to bicarbonate and a possible acidosis.  Patient was noted to have significant amount of emesis throughout his oropharynx and into his trachea on intubation.  I suspect patient had likely aspiration event with subsequent hypoxia and PEA arrest.  Despite intubation and CPR for greater than 30 minutes with multiple doses of epinephrine, bicarb, patient had no return of circulation and no cardiac activity on bedside ultrasound. Resuscitation terminated.  Brother updated via telephone.  ____________________________________________  FINAL CLINICAL IMPRESSION(S) / ED DIAGNOSES  Final diagnoses:  Cardiac arrest (HCC)  Aspiration pneumonia, unspecified aspiration pneumonia type, unspecified laterality, unspecified part of lung (HCC)     MEDICATIONS GIVEN DURING THIS VISIT:  Medications  lactated ringers bolus 1,000 mL (has no administration in time  range)  LORazepam (ATIVAN) injection 0-4 mg (2 mg Intravenous Given 01-Mar-2021 2054)    Or  LORazepam (ATIVAN) tablet 0-4 mg ( Oral See Alternative Mar 01, 2021 2054)  LORazepam (ATIVAN) injection 0-4 mg (has no administration in time range)    Or  LORazepam (ATIVAN) tablet 0-4 mg (has no administration in time range)  thiamine tablet 100 mg (has no administration in time range)    Or  thiamine (B-1) injection 100 mg (has no administration in time range)  sodium chloride 0.9 % bolus 1,000 mL (0 mLs Intravenous Stopped 2021/03/01 2215)  thiamine (B-1) injection 100 mg (100 mg Intravenous Given 03-01-2021 2057)  EPINEPHrine (ADRENALIN) 1 MG/10ML injection (1 mg Intravenous Given Mar 01, 2021 2125)  sodium bicarbonate injection (50 mEq Intravenous Given Mar 01, 2021 2135)  atropine injection (1 mg Intravenous Given Mar 01, 2021 2122)     ED Discharge Orders    None       Note:  This document was prepared using Dragon voice recognition software and may include unintentional dictation errors.   Shaune Pollack, MD 03/01/2021 [phone]

## 2021-03-11 NOTE — ED Provider Notes (Signed)
Procedure Name: Intubation Date/Time: 2021/03/21 9:20 PM Performed by: Gilles Chiquito, MD Pre-anesthesia Checklist: Emergency Drugs available, Suction available and Patient being monitored Oxygen Delivery Method: Non-rebreather mask Preoxygenation: Pre-oxygenation with 100% oxygen Ventilation: Mask ventilation without difficulty Laryngoscope Size: Glidescope Tube size: 7.5 mm Number of attempts: 1 Airway Equipment and Method: Video-laryngoscopy Placement Confirmation: ETT inserted through vocal cords under direct vision Tube secured with: ETT holder          Gilles Chiquito, MD 03-21-2021 2121

## 2021-03-11 NOTE — ED Notes (Signed)
Pt became less agitated after Ativan administration, HR lowered to 90s and continued to dip into low 70s and progressed to 40s, minimal chest rise noted on exam; pt no response to noxious stimuli at this time, NRB placed on patient and central pulse checked by this RN. No pulses noted on exam, compressions started by this RN and shouted for help, airway suctioned between compressions and no blood noted in sputum, charge RN summoned to the bedside and ED MD notified of change in patient status.

## 2021-03-11 NOTE — ED Notes (Signed)
Code STEMI canceled. No acute STEMI as compared to old EKGs.  Pt denies drug use. Pt has been seen here before for psych issues. Pt is homeless.

## 2021-03-11 NOTE — ED Notes (Signed)
Pt highly agitated in bed, attempting to get himself up and out of bed, sts "I can't breathe" and removed Spo2 sensor voluntarily; pt encouraged to remain in bed, HOB elevated to 90 degrees and O2 initated at 4 LPM per [facility]; pt HR in 140s, reapplied spo2 sensor and explained to patient that MD had ordered meds to calm him down, pt verbalized understanding and agreed to remain in bed, audible rhonchi noted without need for stethoscope, pt sts that he has been "coughing up junk" for "the past day", BP 137/101 prior to admin of ordered IV ativan for agitation. Pt alert and oriented x 4, GCS 15 but extremely agitated.

## 2021-03-11 NOTE — ED Notes (Signed)
ED MD at bedside, code team at bedside

## 2021-03-11 NOTE — Progress Notes (Signed)
   03/19/21 1800  Clinical Encounter Type  Visited With Patient not available;Health care provider  Visit Type Initial;Code   Code STEMI page received for this patient. Upon arrival, the patient was receiving care from the medical team. No family present. This chaplain maintained pastoral presence outside of the patient's room, offering silent prayer. Follow-up page received cancelling the Code STEMI. This Clinical research associate will be available if further needs arise.  Clovis Riley, Chaplain

## 2021-03-11 NOTE — ED Notes (Signed)
Covid swab preformed at this time and sent to the lab.

## 2021-03-11 NOTE — ED Notes (Signed)
Per Dr. Shaune Pollack informed carelink Luis Cooper to cancel Code Stemi  1758

## 2021-03-11 NOTE — ED Triage Notes (Signed)
EMS 12 lead showed possible STEMI with BBB Pt to ED via [facility] ASA 324 given No CP, just felt hot and flushed with near syncope 140/74, then 98/62. No NTG given yet CBG 223 EDP at bedside 18g IV R AC

## 2021-03-11 NOTE — ED Notes (Signed)
Pt incontinent of urine. Bed linen changed. Pt cleaned with wipes. Moisture barrier cream applied to pt as well as a brief. Pt has no further needs at this time. Call light within reach.

## 2021-03-11 DEATH — deceased

## 2021-10-05 IMAGING — CT CT CERVICAL SPINE W/O CM
2 series · 12 of 27 positions shown, 15 images · non-contrast
Comparison: None.

CLINICAL DATA: Fall with minor head trauma

EXAM:
CT HEAD WITHOUT CONTRAST
CT CERVICAL SPINE WITHOUT CONTRAST
TECHNIQUE: Multidetector CT imaging of the head and cervical spine was
performed following the standard protocol without intravenous
contrast. Multiplanar CT image reconstructions of the cervical spine
were also generated.

[Series 3: c spine soft · axial · 0.40mm/px · z∈[-276,-154]mm · 7 of 72 slices shown, 9 images]
[im 6/72  soft-tissue]
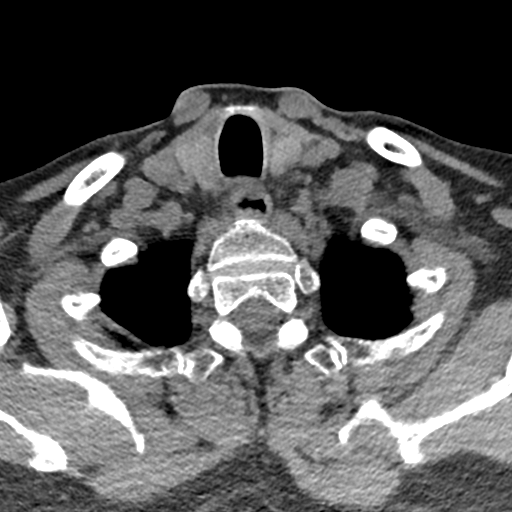
[im 6/72  bone]
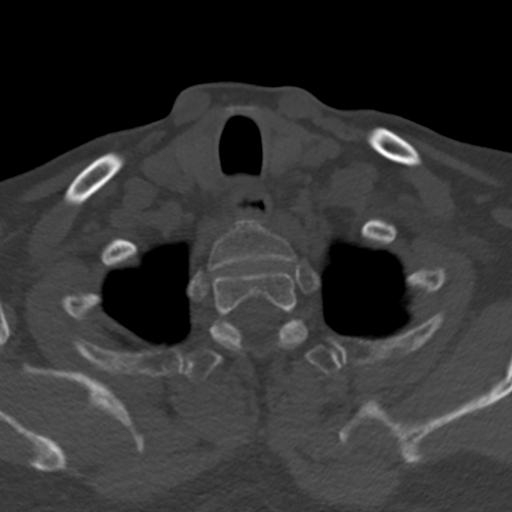
[im 17/72  bone]
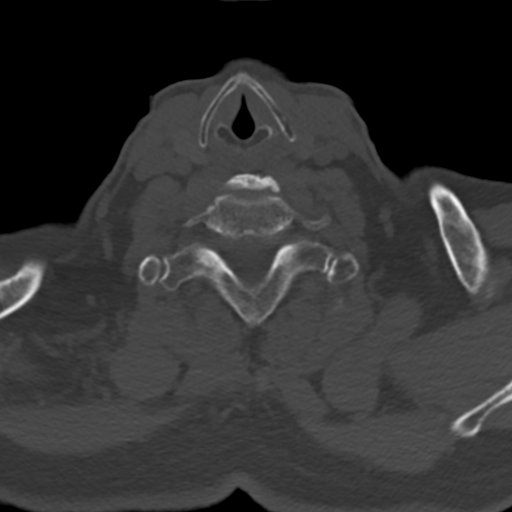
[im 28/72  bone]
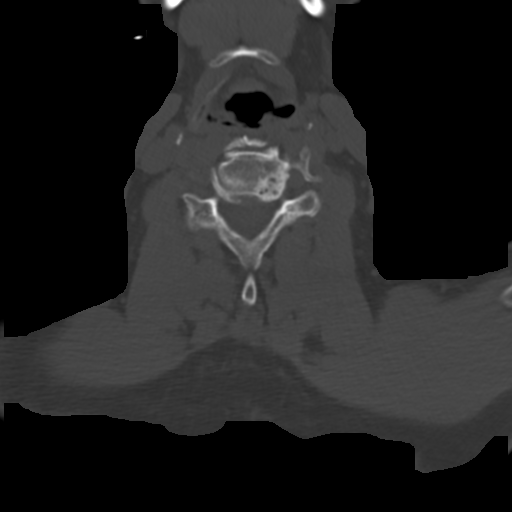
[im 39/72  bone]
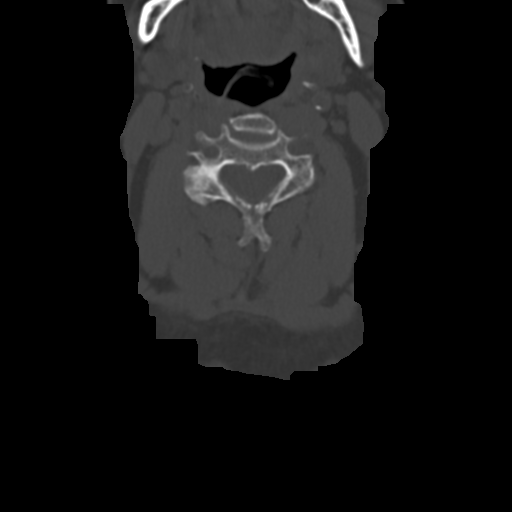
[im 44/72  soft-tissue]
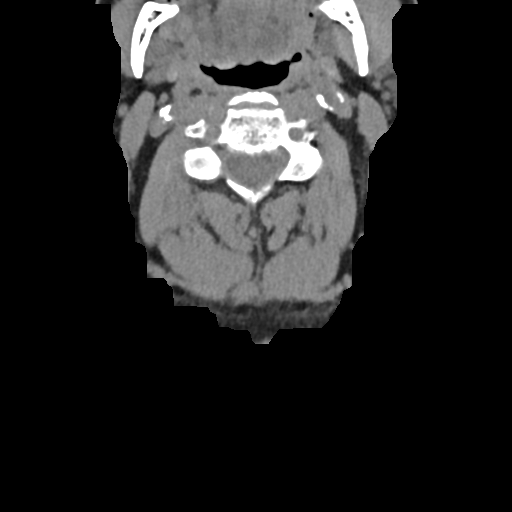
[im 44/72  bone]
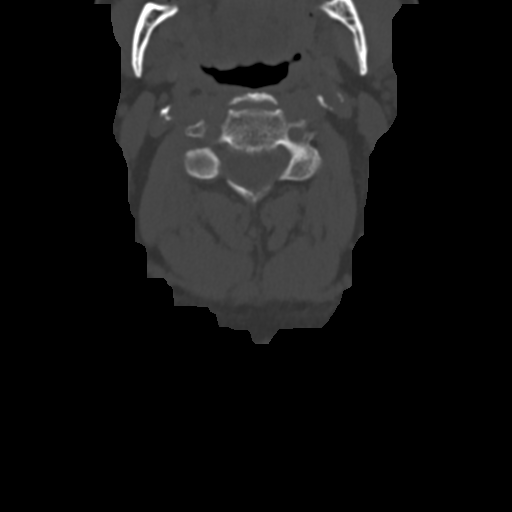
[im 55/72  bone]
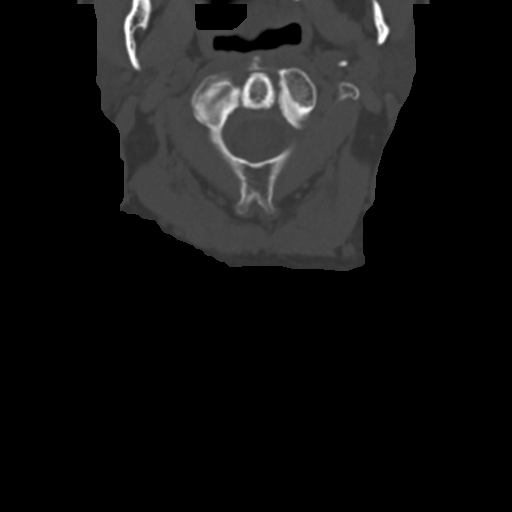
[im 66/72  bone]
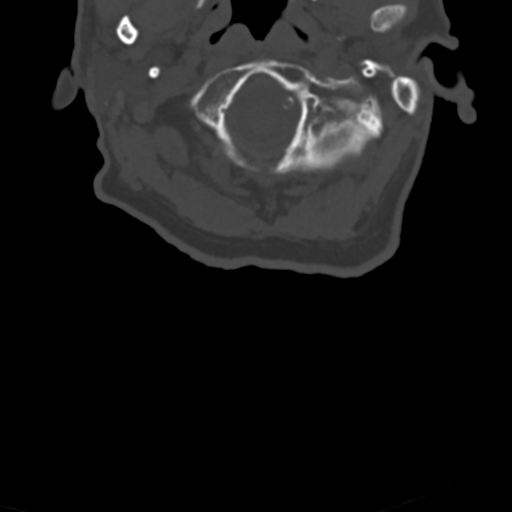

[Series 6: sagittal bone · sagittal · 0.25mm/px · 5 of 53 slices shown, 6 images]
[im 18/53  bone]
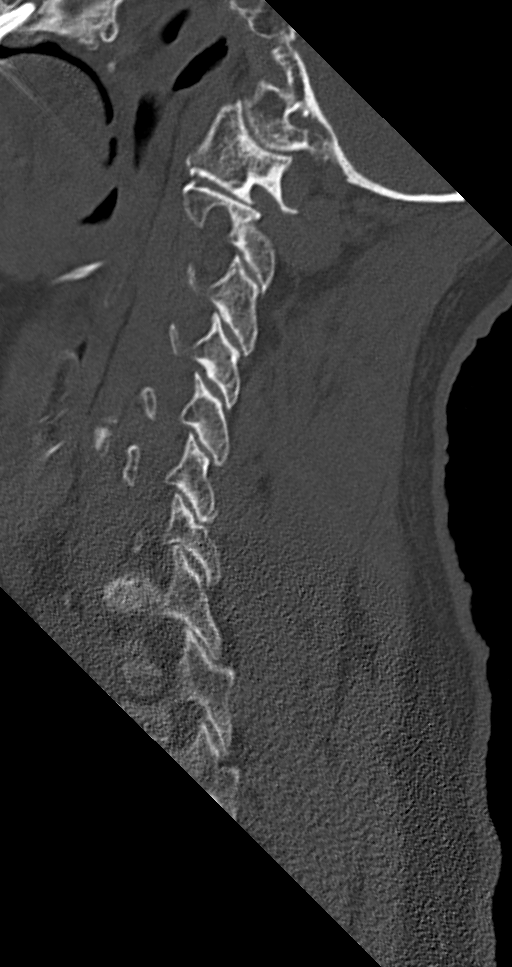
[im 22/53  bone]
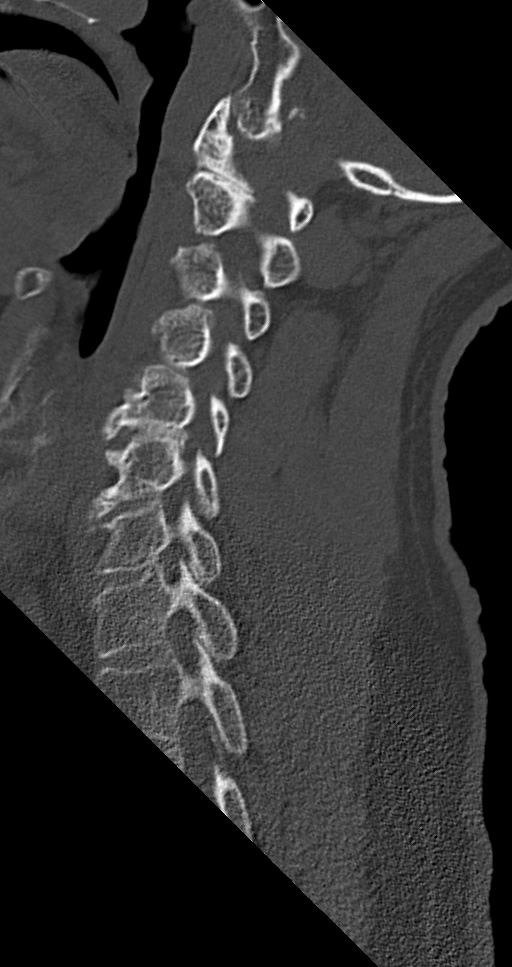
[im 27/53  soft-tissue]
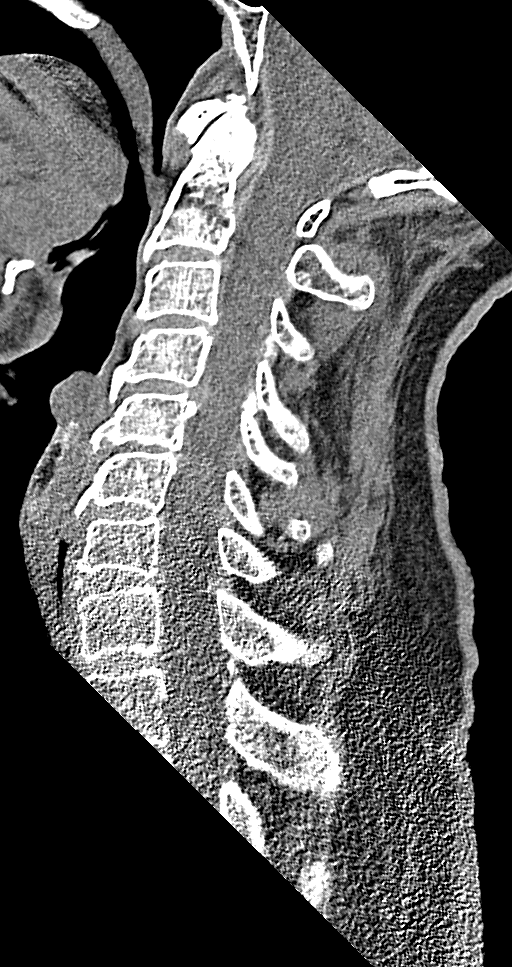
[im 27/53  bone]
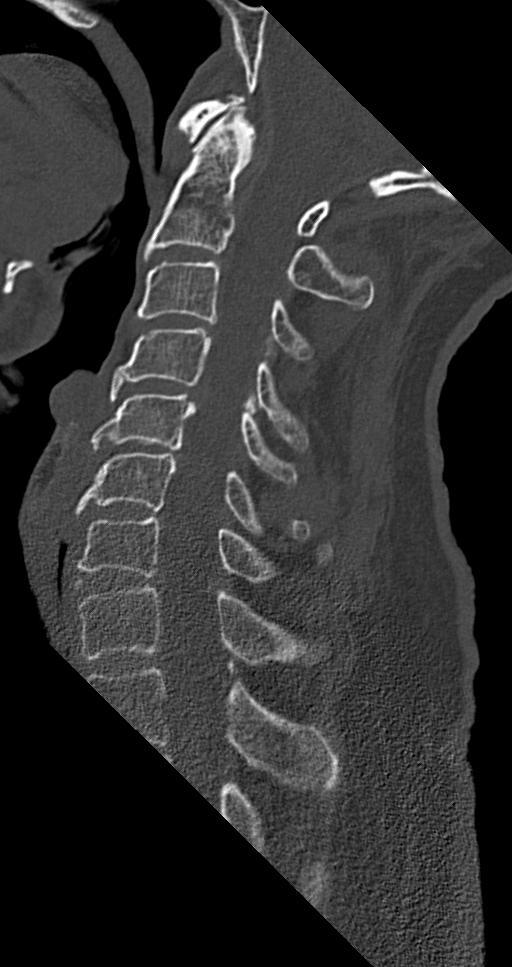
[im 31/53  bone]
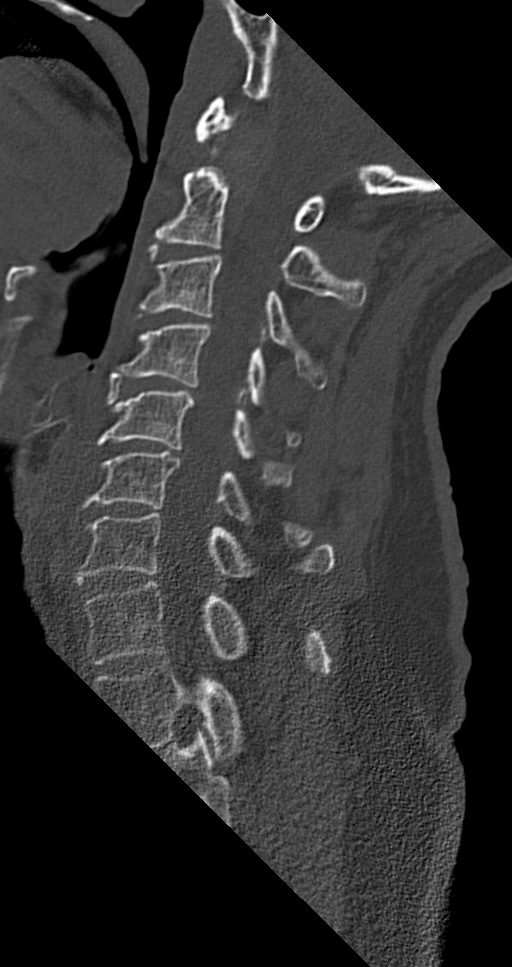
[im 35/53  bone]
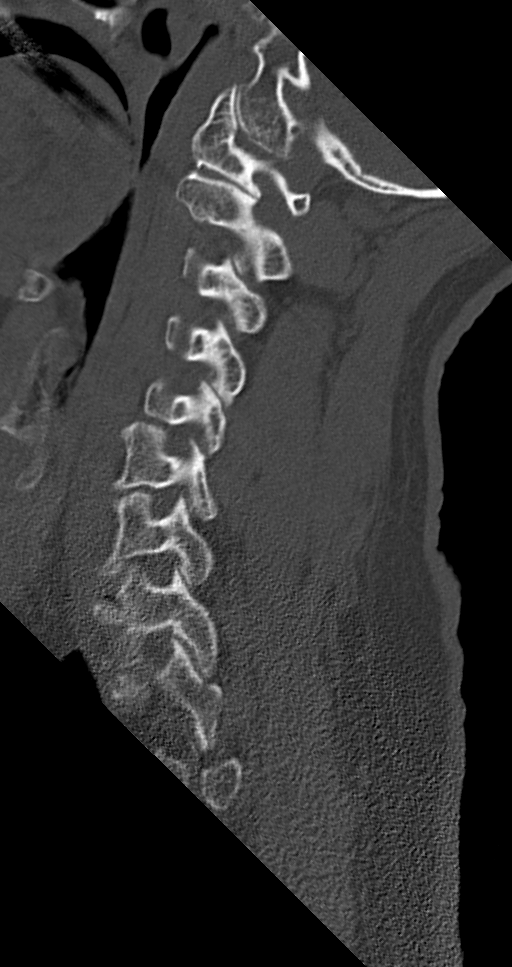

[12 of 27 positions shown; findings below may reference images not displayed]

FINDINGS: CT HEAD FINDINGS

Brain: No evidence of acute infarction, hemorrhage, hydrocephalus,
extra-axial collection or mass lesion/mass effect. Dilated
perivascular space below the left putamen. Age normal brain volume.

Vascular: Atherosclerotic calcification

Skull: Negative for fracture

Sinuses/Orbits: No evidence of injury. Prominent right concha
bullosa

CT CERVICAL SPINE FINDINGS

Alignment: No traumatic malalignment.

Skull base and vertebrae: Negative for fracture

Soft tissues and spinal canal: No prevertebral fluid or swelling. No
visible canal hematoma.

Disc levels:  Ordinary degenerative changes

Upper chest: Negative
IMPRESSION: No evidence of intracranial or cervical spine injury.
